# Patient Record
Sex: Female | Born: 1935 | Race: White | Hispanic: No | Smoking: Current every day smoker
Health system: Southern US, Community
[De-identification: ages and names within clinical notes are randomized; demographics above are authoritative.]

## PROBLEM LIST (undated history)

## (undated) DIAGNOSIS — Z8679 Personal history of other diseases of the circulatory system: Secondary | ICD-10-CM

## (undated) DIAGNOSIS — H269 Unspecified cataract: Secondary | ICD-10-CM

## (undated) DIAGNOSIS — I447 Left bundle-branch block, unspecified: Secondary | ICD-10-CM

## (undated) DIAGNOSIS — R739 Hyperglycemia, unspecified: Secondary | ICD-10-CM

## (undated) DIAGNOSIS — H9190 Unspecified hearing loss, unspecified ear: Secondary | ICD-10-CM

## (undated) DIAGNOSIS — H3530 Unspecified macular degeneration: Secondary | ICD-10-CM

## (undated) DIAGNOSIS — S065X9A Traumatic subdural hemorrhage with loss of consciousness of unspecified duration, initial encounter: Secondary | ICD-10-CM

## (undated) DIAGNOSIS — R5381 Other malaise: Secondary | ICD-10-CM

## (undated) DIAGNOSIS — J449 Chronic obstructive pulmonary disease, unspecified: Secondary | ICD-10-CM

## (undated) DIAGNOSIS — I10 Essential (primary) hypertension: Secondary | ICD-10-CM

## (undated) DIAGNOSIS — E785 Hyperlipidemia, unspecified: Secondary | ICD-10-CM

## (undated) DIAGNOSIS — Z720 Tobacco use: Secondary | ICD-10-CM

## (undated) DIAGNOSIS — F329 Major depressive disorder, single episode, unspecified: Secondary | ICD-10-CM

## (undated) DIAGNOSIS — I480 Paroxysmal atrial fibrillation: Secondary | ICD-10-CM

## (undated) DIAGNOSIS — R079 Chest pain, unspecified: Secondary | ICD-10-CM

## (undated) DIAGNOSIS — R413 Other amnesia: Secondary | ICD-10-CM

## (undated) HISTORY — DX: Tobacco use: Z72.0

## (undated) HISTORY — DX: Unspecified macular degeneration: H35.30

## (undated) HISTORY — DX: Chest pain, unspecified: R07.9

## (undated) HISTORY — PX: EYE SURGERY: SHX253

## (undated) HISTORY — PX: ABDOMINAL HYSTERECTOMY: SHX81

## (undated) HISTORY — DX: Other amnesia: R41.3

## (undated) HISTORY — DX: Essential (primary) hypertension: I10

## (undated) HISTORY — PX: APPENDECTOMY: SHX54

## (undated) HISTORY — DX: Unspecified cataract: H26.9

## (undated) HISTORY — PX: BRAIN SURGERY: SHX531

## (undated) HISTORY — DX: Other malaise: R53.81

## (undated) HISTORY — DX: Chronic obstructive pulmonary disease, unspecified: J44.9

## (undated) HISTORY — DX: Hyperlipidemia, unspecified: E78.5

## (undated) HISTORY — DX: Traumatic subdural hemorrhage with loss of consciousness of unspecified duration, initial encounter: S06.5X9A

## (undated) HISTORY — DX: Major depressive disorder, single episode, unspecified: F32.9

## (undated) HISTORY — DX: Unspecified hearing loss, unspecified ear: H91.90

## (undated) HISTORY — DX: Hyperglycemia, unspecified: R73.9

## (undated) HISTORY — DX: Paroxysmal atrial fibrillation: I48.0

## (undated) HISTORY — DX: Personal history of other diseases of the circulatory system: Z86.79

## (undated) HISTORY — DX: Left bundle-branch block, unspecified: I44.7

---

## 2001-07-22 ENCOUNTER — Inpatient Hospital Stay (HOSPITAL_COMMUNITY): Admission: EM | Admit: 2001-07-22 | Discharge: 2001-07-24 | Payer: Self-pay | Admitting: Emergency Medicine

## 2001-07-23 ENCOUNTER — Encounter: Payer: Self-pay | Admitting: Emergency Medicine

## 2003-05-22 ENCOUNTER — Encounter: Payer: Self-pay | Admitting: Emergency Medicine

## 2003-05-22 ENCOUNTER — Emergency Department (HOSPITAL_COMMUNITY): Admission: EM | Admit: 2003-05-22 | Discharge: 2003-05-22 | Payer: Self-pay | Admitting: Emergency Medicine

## 2004-01-05 ENCOUNTER — Emergency Department (HOSPITAL_COMMUNITY): Admission: EM | Admit: 2004-01-05 | Discharge: 2004-01-05 | Payer: Self-pay | Admitting: Emergency Medicine

## 2004-12-03 ENCOUNTER — Ambulatory Visit (HOSPITAL_COMMUNITY): Admission: RE | Admit: 2004-12-03 | Discharge: 2004-12-03 | Payer: Self-pay | Admitting: *Deleted

## 2005-02-14 ENCOUNTER — Ambulatory Visit: Payer: Self-pay | Admitting: Internal Medicine

## 2005-02-14 ENCOUNTER — Inpatient Hospital Stay (HOSPITAL_COMMUNITY): Admission: EM | Admit: 2005-02-14 | Discharge: 2005-02-15 | Payer: Self-pay | Admitting: Emergency Medicine

## 2005-07-21 ENCOUNTER — Emergency Department (HOSPITAL_COMMUNITY): Admission: EM | Admit: 2005-07-21 | Discharge: 2005-07-21 | Payer: Self-pay | Admitting: Emergency Medicine

## 2007-10-22 DIAGNOSIS — S065XAA Traumatic subdural hemorrhage with loss of consciousness status unknown, initial encounter: Secondary | ICD-10-CM

## 2007-10-22 DIAGNOSIS — S065X9A Traumatic subdural hemorrhage with loss of consciousness of unspecified duration, initial encounter: Secondary | ICD-10-CM

## 2007-10-22 HISTORY — DX: Traumatic subdural hemorrhage with loss of consciousness status unknown, initial encounter: S06.5XAA

## 2007-10-22 HISTORY — DX: Traumatic subdural hemorrhage with loss of consciousness of unspecified duration, initial encounter: S06.5X9A

## 2008-02-09 ENCOUNTER — Inpatient Hospital Stay (HOSPITAL_COMMUNITY): Admission: EM | Admit: 2008-02-09 | Discharge: 2008-02-16 | Payer: Self-pay | Admitting: Internal Medicine

## 2008-02-09 ENCOUNTER — Encounter: Payer: Self-pay | Admitting: Emergency Medicine

## 2008-02-20 ENCOUNTER — Inpatient Hospital Stay (HOSPITAL_COMMUNITY): Admission: EM | Admit: 2008-02-20 | Discharge: 2008-02-24 | Payer: Self-pay | Admitting: Emergency Medicine

## 2008-03-22 ENCOUNTER — Encounter: Admission: RE | Admit: 2008-03-22 | Discharge: 2008-03-22 | Payer: Self-pay | Admitting: Neurosurgery

## 2011-03-05 NOTE — Consult Note (Signed)
NAMESUNDAY, [name]                ACCOUNT NO.:  [id number]   MEDICAL RECORD NO.:  [id number]          PATIENT TYPE:  INP   LOCATION:  1426                         FACILITY:  [facility]   PHYSICIAN:  John C. Madilyn Vaughn, M.D.    DATE OF BIRTH:  1924/04/20   DATE OF CONSULTATION:  02/20/2008  DATE OF DISCHARGE:                                 CONSULTATION   REASON FOR CONSULTATION:  Presumed ischemic colitis.   HISTORY OF ILLNESS:  The patient is an 77 year old white female admitted  today after lower abdominal pain and brief vomiting beginning yesterday.  She has had some loose bowel movements but no gross blood.  She came to  the emergency room where she was found to have a markedly tender abdomen  particularly on the left and the CT scan showed findings compatible with  ischemic colitis with mild wall thickening of the descending colon near  the splenic flexure; also, some diverticulosis was noted with no  definite diverticulitis but that possibly being in the differential.  Incidental gallstones were also noted.  The patient has a wbc count of  23,300 yesterday and lactic acid level slightly elevated to 2.7.  Her  CO2 level was 24.  She has been afebrile and her abdominal pain has  improved slightly.  She had a colonoscopy in December 2006.   PAST MEDICAL HISTORY:  1. Recent subdural hematoma status post burr hole.  2. Paroxysmal atrial fibrillation and flutter previously on Coumadin      until the subdural.  3. Hypertension.  4. Diverticulosis.  5. COPD.  6. Cholecystectomy, presumed asymptomatic.   SURGERIES:  1. Cranial burr hole for subdural hematoma.  2. Partial hysterectomy and bladder suspension 1978.  3. Appendectomy.   MEDICATIONS:  Verapamil, captopril.   SOCIAL HISTORY:  The patient is retired from [employer].  She smokes 10 cigarettes a day and denies alcohol use.   PHYSICAL EXAM:  Elderly white female in mild distress complaining of  abdominal  pain, improved from yesterday.   LABORATORIES:  WBC 26,300; hemoglobin 12.5; platelets 450,000.  CT of  the abdomen as mentioned above.   IMPRESSION:  Likely ischemic colitis, cannot rule out infectious causes.   PLAN:  Bowel rest, general IV support and broad-spectrum antibiotics.  Will follow her wbc count and lactate level as well as her abdominal  exam for any signs of with nonviability of the colon.           ______________________________  [name], M.D.     JCH/MEDQ  D:  02/20/2008  T:  02/20/2008  Job:  [id number]   cc:   Georgiana Spinner, M.D.  Fax: [phone]

## 2011-03-05 NOTE — Discharge Summary (Signed)
[name], [name]                ACCOUNT NO.:  [id number]   MEDICAL RECORD NO.:  [id number]          PATIENT TYPE:  INP   LOCATION:  1426                         FACILITY:  [facility]   PHYSICIAN:  Madaline Savage, MD        DATE OF BIRTH:  Mar 26, 1924   DATE OF ADMISSION:  02/19/2008  DATE OF DISCHARGE:  02/24/2008                               DISCHARGE SUMMARY   PRIMARY CARE PHYSICIAN:  Dr. Nicholos Johns.   CONSULTS IN THE HOSPITAL:  Was seen by Dr. Madilyn Fireman from gastroenterology.   DISCHARGE DIAGNOSES:  1. Ischemic colitis versus infective colitis.  2. Leukocytosis.  3. Paroxysmal atrial fibrillation.  4. Hypertension.  5. Chronic obstructive pulmonary disease.  6. Recent subdural hematoma status post bur hole surgery.   DISCHARGE MEDICATIONS:  1. Verapamil SA 240 mg daily.  2. Captopril 50 mg twice daily.  3. Tylenol 50 mg as needed.  4. Augmentin 500 mg twice daily for seven more days.   PROCEDURES DONE IN THE HOSPITAL:  1. She had an abdominal CT done on Feb 19, 2008 which showed      cardiomegaly and left lower lobe atelectasis/infiltrate,      nonobstructive bowel gas pattern, gaseous distention of colonic      loops.  2. She had a CT of the abdomen and pelvis done Feb 20, 2008 which      showed slight wall thickening and associated inflammatory change      involving the descending colon.  Primary concern is ischemia given      its distribution.  Also consider infectious colitis and possibly      diverticulitis.  3. She had an abdominal x-ray on Feb 21, 2008 which showed slightly      more gaseous distention of the colon.  No definite free air.   HISTORY OF PRESENT ILLNESS:  For full history and physical, see the  history and physical dictated by Dr. Eda Paschal on Feb 19, 2008.  In short,  [name] is an 77 year old lady who had a history of paroxysmal  atrial fibrillation who was taken off Coumadin after she had a subdural  hematoma.  She now comes in with abdominal pain and  constipation for 2  weeks.  Her initial CT scan showed suspected ischemic colitis and she  was admitted for further evaluation and management.   PROBLEM LIST:  1. Ischemic colitis versus infectious colitis.  [name] was      admitted and GI was consulted.  At this time, the primary concern      is this is ischemic colitis likely secondary to her atrial      fibrillation.  She was empirically started on Zosyn and she was      given bowel rest.  She has symptomatically improved at this time.      GI has recommended that she can be discharged home on a low-residue      diet.  At this time, she is not a candidate for anticoagulation      because of her history of subdural hematoma.  We will  complete her      course of antibiotics with one more week of Augmentin.  2. Paroxysmal atrial fibrillation.  She has been rate controlled while      she was in the hospital.  She is not a candidate for      anticoagulation.  3. Leukocytosis which has resolved with her antibiotic treatment.  4. Hypertension.  Her blood pressure has been stable.   DISPOSITION:  She is now being discharged home in stable condition.   FOLLOWUP:  She is asked to follow up with her primary care doctor, Dr.  Nicholos Johns in 1 week.      Madaline Savage, MD  Electronically Signed     PKN/MEDQ  D:  02/24/2008  T:  02/24/2008  Job:  [id number]

## 2011-03-05 NOTE — Discharge Summary (Signed)
[name], [name]                ACCOUNT NO.:  [id number]   MEDICAL RECORD NO.:  [id number]          PATIENT TYPE:  INP   LOCATION:  3112                         FACILITY:  [facility]   PHYSICIAN:  Lonia Blood, M.D.      DATE OF BIRTH:  23-Apr-1924   DATE OF ADMISSION:  02/09/2008  DATE OF DISCHARGE:                               DISCHARGE SUMMARY   PRIMARY CARE PHYSICIAN:  Georgianne Fick, M.D.   DISCHARGE DIAGNOSES:  1. Coumadin-induced coagulopathy with subsequent bleed.  2. Subdural hematoma status post bur hole.  3. Hypertension.  4. Paroxysmal atrial fibrillation.   DISCHARGE MEDICATIONS:  To be dictated at the time of discharge.   DISPOSITION:  The patient is currently stable after craniotomy and bur  hole.  She has occasional headaches that has improved so much from the  beginning.  She will be discharged once cleared by Dr. Wynetta Emery.   PROCEDURES PERFORMED THIS ADMISSION:  1. Chest x-ray on February 09, 2008, that shows mild cardiomegaly,      chronic lung changes, but no acute process.  Head CT without      contrast on February 09, 2008, that showed acute-on chronic bilateral      right greater than left subdural hematomas, small amount of      subarachnoid hemorrhage in the basal systems.  2. A follow-up head CT on February 10, 2008, showed bilateral subdural      hematomas approximately 7 degrees, left midline shift.  Chest x-ray      on February 12, 2008, shows cardiomegaly, COPD, and emphysema.  Head      CT on February 13, 2008, showed interval placement of right subdural      drain with evacuation of the previously seen right subdural      hematoma, interval areas of acute hemorrhage within the left      subdural hematoma with a mild increase in size of the hematoma that      associated 2 mm of midline shift from left to right.  Head CT on      February 14, 2008, showed interval small right frontal subdural      hygroma following drain removal, but there is decreased size of the  left subdural hematoma with decreased mass effect.  3. Right bur hole for evacuation of acute-on chronic subdural hematoma      performed by Dr. Donalee Citrin.   CONSULTATION:  1. Donalee Citrin, M.D., Neurosurgery.  2. Neurology.   BRIEF HISTORY AND PHYSICAL:  Please refer to dictated history and  physical on admission by Dr. Marthann Schiller.  In short, however, this  is an 77 year old female presenting with a fall about a week earlier to  presentation.  The patient has been on Coumadin for paroxysmal atrial  fibrillation.  She has recently been treated with antibiotics as well.  In the ED, she was found to have some slight altered mental status.  Otherwise, her blood pressure was 76/88.  Her labs, at that point, also  indicated some elements of coagulopathy with PT of 13.51 and INR of 2.9.  Subsequently, the patient was scanned and the hematomas were found as  indicated above.  She was admitted for further management to the ICU and  Neurosurgery was immediately consulted.   HOSPITAL COURSE:  1. Subdural hematoma.  The patient was admitted in to the Neuro ICU.      Followed by both Neurology and Neurosurgery.  She has remained      remarkably stable neurologically except for the headaches.  The bur      hole was performed surgically, and since then, she has continued to      improve steadily to this point.  She will have a repeat CT scan      today and possibly discharged home in the next 1-2 days.  2. Hypertension.  Her blood pressure was controlled effectively on      current regimen of medication in the hospital, and the patient has      continued to improve.  3. Coagulopathy.  Due to the patient's bleeding, she has been taken      off the Coumadin at this point.  She probably should not be on any      anticoagulants in the near future.  The patient understand the      risks versus benefits of using the anticoagulants, especially with      her paroxysmal atrial fibrillation, but  realizes that she cannot      continue to use Coumadin anyone.  Other medical problems so far has      been stable on this admission and further addendum could be      dictated at the time of discharge as needed.      Lonia Blood, M.D.  Electronically Signed     LG/MEDQ  D:  02/15/2008  T:  02/15/2008  Job:  [id number]

## 2011-03-05 NOTE — H&P (Signed)
[name], [name]                ACCOUNT NO.:  [id number]   MEDICAL RECORD NO.:  [id number]          PATIENT TYPE:  EMS   LOCATION:  ED                           FACILITY:  [facility]   PHYSICIAN:  Altha Harm, MDDATE OF BIRTH:  1924-07-02   DATE OF ADMISSION:  02/09/2008  DATE OF DISCHARGE:                              HISTORY & PHYSICAL   CHIEF COMPLAINT:  Headache.   HISTORY OF PRESENT ILLNESS:  This is a very active 77 year old lady who  sustained a fall down the stairs approximately 1 week ago.  The patient  states that approximately 5 days ago she started having a headache.  She  was seen by physicians in the community on 2 occasions and was initially  treated for sinusitis and then for arthritis.  The patient and her  family state that she had no changes in her mental status during that  time and did not offer any information about having had a fall to either  of her physicians.  Today, however, the patient called her daughter-in-  law.  The daughter-in-law states that she was confused as compared to  her usual state of consciousness.  She also states that on the phone her  speech appeared a bit slurred and she was somewhat lethargic.  This  prompted her to bring her to the emergency room.  In the emergency room  she had a CT scan done which showed that she had acute on chronic  subdural hematomas, right greater than left, with a component of  subarachnoid hemorrhage.  The patient was evaluated by Donalee Citrin of  neurosurgery and was discussed with me.  We have agreed to admit the  patient to the medical service with neurosurgery on consultation.  Concerning the issue, the patient is on Coumadin for atrial fibrillation  at this time.  The patient has had no fevers or chills.  She has had no  nausea, vomiting or diarrhea.  She has had no loss of speech, no loss of  consciousness, no seizure activity.  She has had no chest pain, no  cough.   PAST MEDICAL HISTORY:   Significant for the following:  1. Macular degeneration, status post lens implants in both eyes.  2. History of diverticulosis.  3. History of cholelithiasis.  4. Paroxysmal atrial fibrillation.  5. Hypertension.  6. COPD.  7. Hyperlipidemia.   FAMILY HISTORY:  Is not relevant in a patient of this age.   SOCIAL HISTORY:  This is a lady who lives alone and works as a Engineer, agricultural at [employer].  She is an  extremely active 77 year old and has smoked for 62 years, 1 pack per  day.  She quit 1 week ago.  She has no alcohol or drug use.  She has a  very supportive family including her son and her daughter-in-law, Jearld Lesch, who can be reached at home at [phone] and on the cell  phone at [phone].  Her daughter-in-law is a retired Arboriculturist.   CURRENT MEDICATIONS:  Coumadin 5 mg p.o.  daily, verapamil SA 240 mg p.o.  daily, captopril 50 mg p.o. b.i.d., Benadryl 50 mg p.o. b.i.d.  The  patient was recently prescribed Xyzal and Ultram for presumed treatment  of sinusitis and arthritis.   ALLERGIES:  NO KNOWN DRUG ALLERGIES.   PRIMARY CARE PHYSICIAN:  Dr. Nicholos Johns.   CARDIOLOGIST:  Her cardiologist is Dr. Lucas Mallow.   REVIEW OF SYSTEMS:  Fourteen systems were reviewed.  All systems were  negative except as noted in the HPI.   STUDIES DONE IN THE EMERGENCY ROOM:  A CT of the head shows acute on  chronic with right greater than left subdural hematomas and a small  component of subarachnoid hemorrhage.  Sodium was 139, potassium 3.8,  chloride 103, bicarb 27, BUN 12, creatinine 0.59, glucose of 97.  White  blood cell count 8.8, hemoglobin 13, hematocrit 39.9, platelet count  381.  An INR of 2.9, PT of 31.5 and PTT of 5.2.  A urinalysis was done.  No elements consistent with a urinary tract infection are present.   PHYSICAL EXAMINATION:  The patient is resting comfortably in bed.  She  continues to complain of a headache.   VITAL SIGNS:  As follows:  Temperature 97, blood pressure 176/88, heart  rate 76, respiratory rate 18, pulse ox 95% on room air and 100% on 2  liters.  HEENT EXAMINATION:  She is normocephalic, atraumatic.  Pupils are equal,  round and reactive to light and accommodation.  Extraocular movements  are intact.  Tympanic membranes are translucent bilaterally with good  landmarks.  The patient is unable to participate in a fundus examination  at this time.  Oropharynx is moist.  No exudate, erythema or lesions are  noted.  NECK EXAMINATION:  Trachea is midline.  No masses, no thyromegaly, no  JVD, no carotid bruit.  RESPIRATORY EXAMINATION:  The patient has a  normal respiratory effort, even excursion bilaterally.  No wheezing or  rhonchi noted.  CARDIOVASCULAR:  She has a normal S1 and S2, irregularly regular.  No  murmurs, rubs or gallops noted.  PMI is nondisplaced.  No heaves or  thrills on palpation.  ABDOMINAL EXAMINATION:  The abdomen is obese, soft, nontender,  nondistended.  No masses, no hepatosplenomegaly noted.  NEUROLOGICAL EXAMINATION:  The patient is alert and oriented x3.  Strength in bilateral upper and lower extremities is 5/5.  DTRs are 2+  bilaterally in the upper and lower extremities.  The patient has no  focal neurological deficits and cranial nerves II-XII appear to be  grossly intact.  She has no ulnar drift and no diadochokinesis noted.  PSYCHIATRIC EXAMINATION:  She is alert and oriented x3.  Good insight,  cognition.  Good recent and remote recall.  LYMPH NODE SURVEY:  She has no cervical, axillary, inguinal  lymphadenopathy noted.  MUSCULOSKELETAL:  She has arthritic changes of  the small joints of the upper and lower extremities.  She has no warmth,  swelling or erythema around the joints.  There is no spinal tenderness  noted.   ASSESSMENT/PLAN:  A patient who presents with:  1. Warfarin coagulopathy.  2. Bilateral subdural hematoma.  3. Acute subarachnoid  hemorrhage, albeit small.  4. Paroxysmal atrial fibrillation.  5. Hypertension.  6. History of chronic obstructive pulmonary disease.  7. Hyperlipidemia.   PLAN:  The patient will be admitted to the step-down unit at [facility].  We will reverse her coagulopathy with vitamin K and FFP,  repeat a CT in the  morning to assess for any extension of the bleed.  The patient will have control of her heart rate by re-placing her on her  verapamil and we will carefully monitor her blood pressure so that her  blood pressure stays between 140 and 160 systolic.  Will give her  medications for pain control and do neurological checks on her.  Dr.  Wynetta Emery, neurosurgery, has been consulted and is aware of the patient's  condition and involved in her care.  Will do best practice for GI  prophylaxis, however, for DVT prophylaxis the patient will be on SCDs as  anticoagulation is contraindicated this time.  We will monitor her blood  sugars as per ICU protocol.      Altha Harm, MD  Electronically Signed     MAM/MEDQ  D:  02/09/2008  T:  02/09/2008  Job:  [id number]   cc:   Georgianne Fick, M.D.  Fax: [phone]   Jaclyn Prime. Lucas Mallow, M.D.  Fax: [phone]   Donalee Citrin, M.D.  Fax: [phone]

## 2011-03-05 NOTE — Op Note (Signed)
[name], [name]                ACCOUNT NO.:  [id number]   MEDICAL RECORD NO.:  [id number]          PATIENT TYPE:  INP   LOCATION:  3112                         FACILITY:  [facility]   PHYSICIAN:  Donalee Citrin, M.D.        DATE OF BIRTH:  1924/03/11   DATE OF PROCEDURE:  02/12/2008  DATE OF DISCHARGE:                               OPERATIVE REPORT   PREOPERATIVE DIAGNOSIS:  Right acute on subacute subdural hematoma.   POSTOPERATIVE DIAGNOSIS:  Right acute on subacute subdural hematoma.   PROCEDURE:  Right bur hole for evacuation of acute on chronic subdural  hematoma.   ANESTHESIA:  General endotracheal.   HISTORY OF PRESENT ILLNESS:  The patient is an 77 year old female who  presented to the emergency room few days ago, complaining of headaches,  nausea, and vomiting.  CT scan showed bilateral subdurals right side  worse, 3-mm right-to-left shift.  Subdural was predominantly chronic,  however, there was a small amount of acute blood, however, was felt to  be able to get enough to bur holes.  So, the patient has recommended bur  hole evacuation after reversal of her Coumadin.  Risks and benefits of  operation were explained to the patient and her family.   PROCEDURE IN DETAIL:  The patient was brought to the OR, was induced  general anesthesia, positioned supine.  The head turned to the left  side.  The right side of her skull exposed over the shoulder bump under  her right shoulder.  Her hair was shaved back in hair spraying manner  and two small incisions were drawn out one frontal, one parietal.  After  infiltrating 10 mL of lidocaine with epi, two bur holes were drilled.  The cup curette was used to undermine the skull and the dura was  coagulated and incised in cruciate fashion.  Chronic __________  appearing subdural came out under pressure from the frontal bur hole,  more acute blood came out of the parietal bur hole.  Several membranes  were incised to actually see cortical  surface, however, cortical surface  was visualized at both sides and both levels and both holes.  Then, a  red rubber catheter was passed irrigating the entire time until clear  irrigant was coming out of the contour bur hole.  Then, a JP drain was  placed from the frontal to the parietal bur hole overlying the three  Penfield and the distal end of the brain was visualized through the  parietal bur hole to confirm that it had not migrated  intraparenchymally.  Then, this was tunneled laterally and hooked up to  a bulb suction.  Then, the parietal bur hole was closed.  The frontal  bur hole was then irrigated again and closed with interrupted Vicryl  staples in the skin.  Drain was sutured and the wounds were dressed.  The patient went to recovery room in stable condition.           ______________________________  Donalee Citrin, M.D.     GC/MEDQ  D:  02/12/2008  T:  02/13/2008  Job:  [id number] 

## 2011-03-05 NOTE — Discharge Summary (Signed)
[name], KEPPLE                ACCOUNT NO.:  [id number]   MEDICAL RECORD NO.:  [id number]          PATIENT TYPE:  INP   LOCATION:  3032                         FACILITY:  [facility]   PHYSICIAN:  Isidor Holts, M.D.  DATE OF BIRTH:  01/30/1924   DATE OF ADMISSION:  02/09/2008  DATE OF DISCHARGE:                               DISCHARGE SUMMARY   ADDENDUM   PRIMARY MEDICAL DOCTOR:  Dr.  Georgianne Fick, M.D.   For discharge diagnoses, consultations, procedures, admission history  and detailed clinical course, refer to interim discharge summary  dictated February 15, 2008  by Yehuda Budd.   In addition, on February 16, 2008, the following are pertinent:  The  patient remained clinically stable, without any further symptomatology.  Blood pressure was adequately controlled.  There were no new issues.  The patient was seen by Dr. Wynetta Emery, neuro surgeon on February 16, 2008 and  cleared from neurosurgical standpoint for discharge.   DISCHARGE MEDICATIONS:  1. Captopril 50 mg p.o. b.i.d.  2. Verapamil SR 240 mg p.o. daily.  3. Tylenol Extra Strength 1 gram p.o. p.r.n. q.6 hours.  4. Xyzal 5 mg p.o. daily p.r.n.   DISPOSITION:  The patient was on February 16, 2008 considered clinically  stable for discharge, with home health PT, OT and aide.  It is likely  that she will be discharged on February 16, 2008, provided home  circumstances can be sorted out, as the patient does live alone. A  clinical Child psychotherapist and case manager have been consulted.   DIET:  Heart-healthy.   ACTIVITY:  As tolerated, otherwise per physical therapy/occupational  therapy.   FOLLOW-UP INSTRUCTIONS:  The patient is to follow up with her primary  M.D., Dr. Georgianne Fick within 1-2 weeks of discharge.  She is to  follow up with Dr. Wynetta Emery, neurosurgeon, at a date to be determined.      Isidor Holts, M.D.  Electronically Signed     CO/MEDQ  D:  02/16/2008  T:  02/16/2008  Job:  [id number]   cc:   Georgianne Fick, M.D.  Donalee Citrin, M.D.

## 2011-03-05 NOTE — Consult Note (Signed)
[name], [name]                ACCOUNT NO.:  [id number]   MEDICAL RECORD NO.:  [id number]          PATIENT TYPE:  EMS   LOCATION:  ED                           FACILITY:  [facility]   PHYSICIAN:  Donalee Citrin, M.D.        DATE OF BIRTH:  [birth year]   DATE OF CONSULTATION:  DATE OF DISCHARGE:                                 CONSULTATION   REASON FOR CONSULTATION:  Bilateral subacute subdural hematomas.   HISTORY OF PRESENT ILLNESS:  The patient is an 77 year old female who  presents to the emergency room complaining of confusion with couple  episodes of nausea and vomiting, brought by her daughter, who was a  former Engineer, civil (consulting).  The patient reports history of falling some time over 3  weeks ago down the steps.  She says she did not hit her head, but  certainly could shook things up a little bit.  She has had some  headaches and confusion, memory difficulty, concentration difficulty.  She denies any numbness, tingling once about the other.  She had 1-2  episodes of nausea and vomiting today, but no other focal neurologic  findings.   PAST MEDICAL HISTORY:  1. Her past medical history is remarkable for atrial fibrillation to      which she is on Coumadin for and the INR in the ER was 2.9.  2. COPD.  3. Hypertension.  4. Hypercholesterolemia.   PAST SURGICAL HISTORY:  Unavailable at this time.   PHYSICAL EXAMINATION:  GENERAL:  The patient is awake, alert, and  oriented x4.  HEENT:  Cranial nerves intact.  Pupils are equal, round, and reactive to  light.  Extraocular movements are intact.  EXTREMITIES:  Strength is 5/5 in upper and lower extremities with no  sign of pronator drift.  Sensation appears to be grossly intact.   CT scan shows bilateral subacute subdural hematomas with small amount of  acute component on the right that does not appear to be any midline  shift, although the CT windowing is rather poor, so the true size of the  CT subdural is difficult to appreciate.  However,  with a current mental  status being well, awake, and alert with an INR of 2.9.  The patient is  being seen by Internal Medicine.  She is the patient of Dr.  Nicholos Johns, and they are going to admit her for observation and  medical management.  I will be back to recommend a reverse of  coagulopathy with vitamin K and FFE and serial CTs.  At which point, the  patient might need a right-sided burr hole treatment versus continued  observation.  We will transfer the patient over the time.  We will admit  for emergency medicine and we will to continue to follow her along  there.           ______________________________  Donalee Citrin, M.D.     GC/MEDQ  D:  02/09/2008  T:  02/10/2008  Job:  [id number]

## 2011-03-05 NOTE — H&P (Signed)
NAME:  [name], [name]                ACCOUNT NO.:  [id number]   MEDICAL RECORD NO.:  [id number]          PATIENT TYPE:  INP   LOCATION:  0102                         FACILITY:  [facility]   PHYSICIAN:  Hind I Elsaid, MD      DATE OF BIRTH:  Mar 19, 1924   DATE OF ADMISSION:  02/19/2008  DATE OF DISCHARGE:                              HISTORY & PHYSICAL   PRIMARY CARE PHYSICIAN:  Georgianne Fick, M.D.   CHIEF COMPLAINT:  Abdominal pain and constipation for two weeks, and  vomiting for one day.   HISTORY OF THE PRESENT ILLNESS:  This is an 77 year old female who was  recently discharged from the hospital after being diagnosed with a  subdural hematoma status post bur hole, also Coumadin-induced  coagulopathy with subsequent subdural hematoma and status post  discontinued Coumadin indefinitely.  She is admitted today to the  hospital with the chief complaint of left lower abdominal pain for two  weeks.  The pain is 10/10 and nonradiating.  The pain is also associated  with constipation for two weeks.  The daughter-in-law tried to give the  patient a Fleets enema and some laxatives at home, but without any bowel  movement today.  The patient tried to go to the bathroom where the  daughter-in-law noticed that the patient had become clammy, cold and  pale.  She had to put her in the bed and then brought her to the  emergency room here.  As per the daughter-in-law the patient has had 20  episodes of vomiting of mainly greenish/yellow vomitus today.  Her  condition is associated with no fever.  According to the patient she has  a good appetite.  The patient denies any tenesmus.   PAST MEDICAL AND SURGICAL HISTORY:  1. History of a subdural hematoma status post bur hole.  2. Hypertension.  3. Paroxysmal atrial fibrillation/flutter.  4. Coumadin-induced coagulopathy.  5. Diverticulosis.  6. COPD/bronchial asthma.  7. Status post appendectomy.  8. Status post partial hysterectomy and bladder  tack in 1978.  9. Macular degeneration.  10.Status post lens implant.  11.Cholelithiasis.  12.Seasonal allergies.   MEDICATIONS:  1. Verapamil 240 mg daily.  2. Captopril 50 mg twice daily.  3. Tylenol Extra Strength 1 gram by mouth as needed every 6 hours.  4. Oxyzal 5 mg by mouth daily as needed.   SOCIAL HISTORY:  The patient used to work as a Marine scientist at [employer].  She stopped working after  her subdural hematoma.  She used to be an extremely active lady.  She is  currently a smoker, although she tried to quit and now smokes 10  cigarettes per day.  She has a very supportive family, especially her  daughter-in-law, who is a Geologist, engineering.   ALLERGIES:  No known drug allergies.   REVIEW OF SYSTEMS:  Complains of headaches sometimes, but these improved  after the evacuation of the subdural hematoma.  Denies any seizure  activity.  Denies any chest pain.  Denies any cough.  Denies any  shortness of breath.  Denies any burning micturition.  Denies any  abnormal rash.   PHYSICAL EXAMINATION:  GENERAL APPEARANCE:  On examination the patient  is lying comfortably in bed.  She is not in respiratory distress nor has  shortness of breath.  VITAL SIGNS:  Temperature 98, blood pressure in the beginning was 90/43  and improved to 174/67, pulse rate 78, respiratory rate 24, and sating  93% on room air.  HEENT:  Head is normocephalic and atraumatic, status post a bur hole in  her right temporal area without evidence of infection.  Pupils are  round, equal and react to light and accommodation.  Extraocular muscle  movements are intact.  Oropharynx is dry with no exudate.  NECK:  The trachea is midline.  No masses.  No thyromegaly.  No JVD.  No  bruits.  CHEST AND LUNGS:  On respiratory examination there is normal respiratory  effort.  Normal fascicular breathing without wheezing or rhonchi.  HEART:  Cardiovascular with normal S1 and S2,  irregularly irregular.  ABDOMEN:  The abdomen is distended.  Tender to palpation in the left  lower quadrant.  No organomegaly.  Bowel sounds are diminished.  NEUROLOGIC:  On neurological examination the patient is alert and  oriented x3 with no focal neurological findings.   LABORATORY FINDINGS:  Lipase 31.  Sodium 142, potassium 3.2, chloride  103, CO2 24, glucose 213, BUN 23, and creatinine 1.33.  Total bilirubin  0.8, alkaline phosphatase 109, AST 30 and ALT 16.  Total protein 6.9,  albumin 3.3 and calcium 10.1.  CBC 23.3, hemoglobin 12.5, hematocrit  37.9, and platelets 450,000.  Urinalysis; white blood cells 3-6 with  hyaline casts.  CT of the abdomen and pelvis showed evidence of  inflammatory changes of the proximal sigmoid colon, which could indicate  ischemia or diverticulitis.  Abdominal x-ray and chest x-ray showed  cardiomegaly and left lower lobe atelectasis or infiltrate.  Nonobstructive bowel gas pattern.  Gaseous distention of the colonic  loop.   ASSESSMENT AND PLAN:  1. Abdominal pain and distention.  This is possibility secondary to      colitis, which could be ischemic versus infectious; I am leaning      towards ischemic.  The patient will be admitted to the hospital,      kept nothing per os and started on intravenous fluids at 100      cc/hour with potassium supplement.  We will place the patient      empirically on Zosyn.  We will get stools for Clostridium difficile      to rule out infectious colitis if the patient has a bowel movement.      We will consider consulting gastroenterology.  2. Constipation for two weeks.  We will hold verapamil .  3. Acute renal failure.  Intravenous fluids.  4. New onset diabetes mellitus.  We will get a hemoglobin A-1-C and      place the patient on a sliding scale coverage at this time.  5. Hyperkalemia.  We will place her on replacement.  6. Hypertension.  We will discontinue verapamil and angiotensin      converting  enzyme inhibitors.  We will place the patient on a      clonidine patch along with Lopressor intravenously at a as needed      dosage.  7. Status post subdural hematoma; seems stable.  We will continue her      on physical therapy in the hospital.  8. History of paroxysmal  atrial fibrillation.  We will get an      electrocardiogram.  Her rate seems under control at this time.  9. Leukocytosis most probably secondary to ischemic versus infectious      colitis/diverticulitis.  10.Colonic distention.  We will repeat KUB in 1-2 days.  11.Deep venous thrombosis and gastrointestinal prophylaxes.  12.Further recommendations to be addressed as her hospital course      progresses.      Hind Bosie Helper, MD  Electronically Signed     HIE/MEDQ  D:  02/20/2008  T:  02/20/2008  Job:  [id number]

## 2011-03-08 NOTE — Discharge Summary (Signed)
[facility]  Patient:    [name], [name] Visit Number: [id number] MRN: [id number]          Service Type: MED Location: 3W 4782 02 Attending Physician:  Clovis Cao Dictated by:   Jaclyn Prime. Lucas Mallow, M.D. Admit Date:  07/22/2001 Discharge Date: 07/24/2001   CC:         Trudee Kuster, M.D.                           Discharge Summary  CHIEF COMPLAINT:  Weak spell, palpitations, and near fainting.  HISTORY OF PRESENT ILLNESS:  This 77 year old woman, whose primary physician is Dr. Trudee Kuster, was sent to the emergency room because of a weak spell during which she nearly passed out and felt palpitations in her chest.  She has a long history of palpitations and had relatively recently been found to have paroxysmal atrial fibrillation.  Her palpitations were described as fluttering in quality, severe in severity, and variable in duration.  She said that the feeling with atrial fibrillation is different from the palpitations she has had in the past.  Comprehensive details of past medical, family, and social history, and review of systems are to be found in the history and physical.  PHYSICAL EXAMINATION:  GENERAL:  Well-developed, well-nourished woman who looked younger than her stated age of 26.  VITAL SIGNS:  Blood pressure 177/93, pulse 100 and irregularly irregular, respirations 24.  Her respiratory effort was normal at rest but with moderately prolonged forced expiratory time and diffuse rhonchi with a few wheezes.  HEART:  Heart rhythm was irregular, but the rate was normal except for occasional episodes of faster heart rate.  The overall cardiac exam was unremarkable otherwise.  The remainder of the physical exam was also unremarkable.  LABORATORY DATA:  White blood cell count 8800, hemoglobin 14.6, hematocrit 43, mean cell volume 86, platelets 336,000, normal automated differential. Prothrombin time/INR 2.4.  Electrolytes normal,  creatinine 0.8, BUN 12.  CPK total peak 326 with 2.1 units of MB band, peak troponin I 0.41.  Initial EKG showed sinus tachycardia with left atrial enlargement and tiny Q-waves in the inferior leads, probably not diagnostic.  Repeat EKG July 23, 2001, showed atrial flutter with variable AV block.  Repeat EKG on July 24, 2001, incidentally incorrectly read by the official reader, showed sinus bradycardia with marked sinus arrhythmia and/or frequent PACs, not atrial fibrillation as interpreted.  Chest x-ray dated July 23, 2001, showed "heart size to be prominent. Pulmonary vascularity is accentuated."  HOSPITAL COURSE:  She was admitted to a telemetry bed, continued on her usual dose of Coumadin, and started on intensive therapy with oral diltiazem.  She was also continued on prednisone, which she had been taking for a respiratory tract infection/bronchitis.  On this regimen she had much better control of her atrial flutter and greatly increased comfort and decreased sense of "flip-flops."  Her enzymes were reviewed, and it was felt appropriate to carry out noninvasive testing, but this would require delay until her bronchospasm resolved.  That will be planned as an outpatient.  By the second full day in the hospital she was much more comfortable and anxious to return home.  Her chest still revealed clearly diffuse rhonchi.  At heart rhythm was consistent with episodic atrial flutter but overall well controlled rate.  She was considered adequately stable, with plans for follow-up as noted.  FINAL DIAGNOSES: 1. Presyncope due to paroxysmal  atrial fibrillation/flutter. 2. Paroxysmal atrial fibrillation/flutter. 3. Acute bronchitis with continued treatment with prednisone. 4. Tobacco abuse. 5. Chronic Coumadin therapy.  DIET:  No specific restrictions.  MEDICATIONS: 1. Prednisone 20 mg daily for five days. 2. Albuterol inhaler 2 puffs t.i.d. 3. Robitussin as needed. 4. Celexa  5 mg daily. 5. Coumadin 2.5 mg Tuesday and Thursday, 5 mg the other five days of the week. 6. Cardizem CD 360 mg daily. 7. Benadryl as needed.  RETURN TO WORK:  Not applicable.  SPECIAL INSTRUCTIONS:  The patient is strongly urged to discontinue the use of tobacco products.  FOLLOW-UP:  Return with physician, Dr. Lucas Mallow, in one to two weeks.  The patient is to call for an appointment.  We will plan to schedule her Persantine Cardiolite test as an outpatient once her bronchospasm is sufficiently suppressed. Dictated by:   Jaclyn Prime. Lucas Mallow, M.D. Attending Physician:  Clovis Cao DD:  08/11/01 TD:  08/12/01 Job: [id number]

## 2011-03-08 NOTE — H&P (Signed)
[facility]  Patient:    [name], [name] Visit Number: [id number] MRN: [id number]          Service Type: MED Location: 3W 4782 02 Attending Physician:  Clovis Cao Dictated by:   Jaclyn Prime. Lucas Mallow, M.D. Admit Date:  07/22/2001   CC:         Trudee Kuster, M.D.   History and Physical  CHIEF COMPLAINT:  Weak spell, palpitations and near fainting.  HISTORY OF PRESENT ILLNESS:  This 77 year old woman, primary care patient of Dr. Trudee Kuster, was sent to the emergency room because of a weak spell during which she nearly passed out and felt palpitations in her chest.  She has a long history of palpitations, and has relatively recently been found to have paroxysmal atrial fibrillation.  She says that the palpitations are fluttering in quality, severe in severity, and last variable lengths of time. She says that she has had a history of palpitations for many years, but that the feeling with atrial fibrillation is different.  PAST MEDICAL HISTORY:  Her past medical history includes a bladder tack and partial hysterectomy in 1978; this was complicated by a hematoma, and she was in the hospital for a total of five weeks.  She has also had macular degeneration for which she takes drops very frequently.  CURRENT MEDICATIONS:  Coumadin 2.5 mg Tuesday and Thursday, 5 mg Monday, Wednesday, Friday, Saturday and Sunday, Celexa, Benadryl, and Robitussin.  She takes prednisone and ketorolac drops for her eyes.  FAMILY HISTORY:  Her mother died at 27 of congestive heart failure and her father died at 65 of a heart attack.  Both of her parents had diabetes.  There is no known early coronary heart disease in the family.  SOCIAL HISTORY:  She lives alone; she has been divorced for 27 years.  She asks whether, in view of recent revelations in the media, she is likely to live a shorter length of time because of her divorce.  She continues to smoke.  REVIEW OF  SYSTEMS:  CONSTITUTIONAL:  She denies fevers, chills or sweats.  She has no claudication.  She sleeps fairly well.  EYES:  No diplopia or blurring. She does have macular edema in her right eye.  ENT:  No deafness or dizziness. She has partial dentures in both jaws.  CARDIOVASCULAR:  She denies any chest pain.  Her long history of palpitations is noted above.  She has no PND or orthopnea.  RESPIRATORY:  She has had recent cough and intermittent wheezing with some sputum production.  As noted above, she continues to smoke.  GI: She is bothered from a hiatal hernia and reflux.  No change in bowel habits. GU:  No dysuria or pyuria but has incontinence of urine.  MUSCULOSKELETAL:  No myalgia.  She is bothered by painful joints.  SKIN AND BREASTS:  No rash or nodule.  NEUROLOGIC:  Her near-syncope is noted above.  She has had no previous episodes of arrhythmia severe enough to feel like as though she were going to pass out.  PSYCHIATRIC:  No depression or hallucination.  ENDOCRINE: No known diabetes or thyroid disease.  HEMOLYMPH:  No swelling in the neck, axillae or groin.  ALLERGIC/LYMPHATIC:  She has no known drug allergy.  PHYSICAL EXAMINATION:  VITAL SIGNS:  Blood pressure 177/93, pulse 100 and irregularly irregular on admission, respirations 24.  GENERAL:  She is a well-developed, well-nourished woman who looks younger than her stated age; she is 75.  She is oriented to person, place and time and her mood and affect are appropriate.  HEENT:  Her conjunctivae and lids reveal no xanthelasma, icterus, or arcus senilis.  She has partial dentures and the oral mucosa otherwise reveals no pallor or cyanosis.  NECK:  Supple and symmetrical.  The trachea is midline and mobile.  There is no palpable thyromegaly, cervical node, carotid bruit or JVD.  LUNGS:  Her respiratory effort is normal at rest.  With forced expiration, there is moderately prolonged forced expiratory time, and diffuse  rhonchi with a few wheezes.  MUSCULOSKELETAL:  Her back is straight and nontender.  There is no kyphosis or scoliosis.  Her gait is not tested.  She probably could undergo a partial stress test.  Muscle strength and tone are age appropriate.  CARDIAC:  The cardiac apical impulse is cryptic.  The left border of cardiac dullness is within the left anterior axillary line and there is no palpable gallop sound.  The heart rhythm is irregular and the rate is normal, sometimes speeding up.  There is no gallop, click, or murmur.  Her digits and nails reveal no cyanosis.  SKIN:  Skin and subcutaneous tissue reveal no stasis dermatitis or ulcer.  ABDOMEN:  Flat and nontender.  There is no mass.  Bowel sounds are present. There is no palpable enlargement of the liver or spleen.  The abdominal aorta is not palpable and there is no bruit.  EXTREMITIES:  The femoral arteries are palpable and there is no bruit.  The pedal pulses are intact, posterior tibial and dorsalis pedis bilaterally.  Her legs reveal no edema.  LABORATORY DATA:  The EKG taken in the emergency room shows sinus rhythm, left atrial enlargement, and no ST-T segment abnormality.  Telemetry in the emergency room showed frequent runs of atrial fibrillation with a rapid rate.  ASSESSMENT:  This 77 year old woman has had presyncope, with atrial fibrillation and rapid rate, no doubt the cause of her symptoms.  She needs hospitalization to gain adequate control of her atrial fibrillation to reduce her likelihood of falling.  We will also obtain serial enzymes to screen for significant inducible ischemia. Dictated by:   Jaclyn Prime. Lucas Mallow, M.D. Attending Physician:  Clovis Cao DD:  07/23/01 TD:  07/24/01 Job: [id number]

## 2011-03-08 NOTE — Op Note (Signed)
NAMEJASHLEY, [name]                ACCOUNT NO.:  [id number]   MEDICAL RECORD NO.:  [id number]          PATIENT TYPE:  AMB   LOCATION:  ENDO                         FACILITY:  [facility]   PHYSICIAN:  Georgiana Spinner, M.D.    DATE OF BIRTH:  [birth year]   DATE OF PROCEDURE:  12/03/2004  DATE OF DISCHARGE:                                 OPERATIVE REPORT   PROCEDURE:  Colonoscopy.   INDICATIONS:  Colon cancer screening.   ANESTHESIA:  Demerol 70, Versed 7 mg.   DESCRIPTION OF PROCEDURE:  With the patient mildly sedated in the left  lateral decubitus position, a rectal examination was performed which  revealed that she had decreased rectal tone and could not really cause much  pressure on my examining finger. Subsequently the Olympus videoscopic  colonoscope was inserted in the rectum and passed under direct vision to  cecum identified by ileocecal valve and appendiceal orifice both of which  were photographed. From this point, the colonoscope was slowly withdrawn  taking circumferential views of the colonic mucosa stopping to photograph  diverticulosis that we saw in the sigmoid colon as we removed all the way to  the rectum which appeared normal on direct showed hemorrhoids on retroflexed  view. The endoscope was straightened and withdrawn. The patient's vital  signs and pulse oximeter remained stable. The patient tolerated procedure  well without apparent complications.   FINDINGS:  Diverticulosis of sigmoid colon, internal hemorrhoids, otherwise  an unremarkable examination.   PLAN:  Consider repeat examination in 5-10 years.      GMO/MEDQ  D:  12/03/2004  T:  12/03/2004  Job:  [id number]

## 2011-03-08 NOTE — H&P (Signed)
[name], [name]                ACCOUNT NO.:  [id number]   MEDICAL RECORD NO.:  [id number]          PATIENT TYPE:  INP   LOCATION:  0102                         FACILITY:  [facility]   PHYSICIAN:  Isidor Holts, M.D.  DATE OF BIRTH:  08/15/1924   DATE OF ADMISSION:  02/14/2005  DATE OF DISCHARGE:                                HISTORY & PHYSICAL   PRIMARY CARE PHYSICIAN:  Jaclyn Prime. Lucas Mallow, M.D.   GASTROENTEROLOGIST:  Georgiana Spinner, M.D.   CHIEF COMPLAINT:  Abdominal pain, almost passed out.   HISTORY OF PRESENT ILLNESS:  This is a 77 year old female with a history of  paroxysmal atrial fibrillation, obstructive airways disease and  diverticulosis.  According to the patient, she went to bed at midnight on  February 13, 2005.  At about 1:30 a.m., she developed crampy lower abdominal  pain.  Denies vomiting or diarrhea.  She went to the bathroom but was unable  to move her bowels.  On her way back, she felt lightheaded and fell flat on  her face.  Denies chest pain, palpitations, or shortness of breath.  Denies  loss of consciousness.  She lay there on the floor for approximately 15  minutes, then crawled to the phone and called EMS.  She denies any ankle  swelling or discomfort.  The patient is fairly active, has had no recent  long distance travel.   PAST MEDICAL HISTORY:  1.  Paroxysmal atrial fibrillation/flutter in 2002, on anticoagulation.  2.  COPD/bronchial asthma.  3.  Smoker.  4.  Status post appendectomy.  5.  Status post partial hysterectomy and bladder tack in 1978.  6.  Macular degeneration.  7.  Status post bilateral lens implants.  8.  Diverticulosis.  9.  Cholelithiasis.  10. Seasonal allergies.   MEDICATIONS:  1.  Verapamil SR 240 mg p.o. q.d.  2.  Captopril 25 mg p.o. b.i.d.  3.  Coumadin 2.5 mg on Thursdays, 5 mg all other days.  4.  Benadryl OTC p.r.n.  5.  Bronchodilator inhaler (which?) p.r.n.   ALLERGIES:  No known drug allergies.   REVIEW OF SYSTEMS:   As per HPI and chief complaint, otherwise negative.   SOCIAL/FAMILY HISTORY:  The patient is a smoker, smokes one pack of  cigarettes per day for the past 63 years.  Nondrinker.  No history of drug  abuse.  She is divorced.  She lives alone.  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 in [place]; works 2-1/2 days a week.  She has three offspring; one is wheelchair bound status post MVA, the rest  are alive and well.  Her mother died at age 40 years secondary to CHF; she  was diabetic.  Father died at age 58 status post MI; he was also diabetic.   PHYSICAL EXAMINATION:  VITAL SIGNS:  Temperature 97.6, pulse 82 per minute,  regular respiratory rate 18, BP 128/59 mmHg, pulse oximetry 93% on room air.  GENERAL:  The patient appears quite comfortable, alert, communicative,  oriented, and not short of breath at  rest.  HEENT:  No clinical pallor, no jaundice.  No conjunctival injections.  Throat is quite clear.  NECK:  Supple, JVP not seen.  No palpable lymphadenopathy.  No palpable  goiter.  No carotid bruits.  CHEST:  Clinically clear to auscultation.  No wheezes, no crackles.  HEART:  Sounds 1 and 2 heard.  Normal, no murmurs.  There are occasional  ectopics.  ABDOMEN:  Full, soft.  The patient has vague lower abdominal tenderness.  No  guarding, no rebound, normal bowel sounds.  There is no palpable  organomegaly.  EXTREMITIES:  Lower extremities:  Varicosities are noted.  No pitting edema.  Palpable peripheral pulses.  MUSCULOSKELETAL:  Arthritic changes but otherwise largely unremarkable.  CENTRAL NERVOUS SYSTEM:  No focal neurologic deficit on gross examination.   INVESTIGATIONS:  CBC:  WBC 18.0, hemoglobin 14.8, hematocrit 45.7, platelets  340.  Electrolytes:  Sodium 140, potassium 3.4, chloride 105, CO2 28, BUN  17, creatinine 1.0, glucose 116.  AST 27, ALT 18, alkaline phosphatase 113.  INR 2.3.  Urinalysis negative.  Chest x-ray dated February 14, 2005,  with no  acute findings.  Abdominal/pelvic CT scan February 14, 2005, shows possible  right lower lobe pulmonary embolism, stable 8-mm left lower lobe nodule,  gallstones, biliary dilatation in liver and dilated pancreatic duct, also  low density filling defects in duodenum next to ampulla of Vater,  inflammatory changes consistent with diverticulitis noted in the splenic  flexure.  EKG shows sinus rhythm, regular, 82 per minute, normal axis, left  bundle branch block pattern.   ASSESSMENT AND PLAN:  1.  Near syncope, unclear etiology.  The patient has a history of paroxysmal      atrial fibrillation.  This may have been the culprit secondary to      transient tachyarrhythmia.  The patient is in sinus rhythm at this time      and is asymptomatic.  We will admit for telemetry monitoring and cycle      cardiac enzymes.  EKG shows left bundle branch block pattern which is      unchanged since August 2004.  We shall order for completeness carotid      and vertebral duplex, do echocardiogram, and brain MRI.   1.  Query possible pulmonary embolism in right lower lobe on chest CT scan.      The patient is already on anticoagulation for paroxysmal atrial      fibrillation and INR is therapeutic at 2.3.  She is now status post CT      angiogram which does not show any evidence of pulmonary embolism.   1.  Abdominal pain.  Abdominal CT scan confirms colonic diverticulitis.  We      will manage with ciprofloxacin and Flagyl, intravenous fluids and clear      fluids for now.   1.  Abdominal CT finding of biliary/pancreatic duct dilatation and duodenal      filling defects near ampulla of Vater.  Review of abdominal CT scan      reports of May 22, 2003, also indicated pancreatic duct dilatation of      unclear etiology at that time.  The patient does have cholelithiasis,     albeit asymptomatic.  We will request GI consultation which we expect      will be provided by Dr. Virginia Rochester, as ERCP may be  indicated.  Management will      otherwise, be per GI recommendations.  It is noted that the patient  had      a colonoscopy on December 06, 2004, which showed diverticulosis and      internal hemorrhoids but was otherwise unremarkable.   1.  History of chronic obstructive pulmonary disease/smoking.  Will manage      with p.r.n. bronchodilators, nebulizers, and provide smoking cessation      counseling.  Further management will depend on clinical course.      CO/MEDQ  D:  02/14/2005  T:  02/14/2005  Job:  [id number]   cc:   Jaclyn Prime. Lucas Mallow, M.D.  [address]  [place]  [place] [address]  Fax: [phone]   Georgiana Spinner, M.D.  [address]  [place]  [place] [address]  Fax: [phone]

## 2011-07-16 LAB — DIFFERENTIAL
Basophils Absolute: 0
Basophils Relative: 1
Basophils Relative: 0
Eosinophils Absolute: 0.2
Eosinophils Relative: 2
Lymphocytes Relative: 26
Lymphocytes Relative: 24
Monocytes Relative: 6
Monocytes Relative: 9
Neutro Abs: 5.7
Neutro Abs: 6.8
Neutrophils Relative %: 65
Neutrophils Relative %: 62

## 2011-07-16 LAB — CBC
HCT: 39.9
HCT: 33 — ABNORMAL LOW
Hemoglobin: 11 — ABNORMAL LOW
MCHC: 32.6
MCHC: 33.1
MCV: 87.1
Platelets: 381
RBC: 4.58
RBC: 3.71 — ABNORMAL LOW
RBC: 3.79 — ABNORMAL LOW
RBC: 3.99
RDW: 14.4
WBC: 11.6 — ABNORMAL HIGH
WBC: 11.9 — ABNORMAL HIGH

## 2011-07-16 LAB — PREPARE FRESH FROZEN PLASMA

## 2011-07-16 LAB — TRICYCLICS SCREEN, URINE: TCA Scrn: NOT DETECTED

## 2011-07-16 LAB — URINE MICROSCOPIC-ADD ON

## 2011-07-16 LAB — BASIC METABOLIC PANEL
CO2: 29
Calcium: 9.1
Chloride: 102
Chloride: 97
Creatinine, Ser: 0.58
Creatinine, Ser: 0.58
GFR calc Af Amer: >60
GFR calc Af Amer: >60
Potassium: 3.8
Sodium: 139

## 2011-07-16 LAB — COMPREHENSIVE METABOLIC PANEL
ALT: 25
AST: 32
Alkaline Phosphatase: 94
BUN: 12
Chloride: 103
Potassium: 3.8
Total Bilirubin: 0.9

## 2011-07-16 LAB — URINE CULTURE: Culture: NO GROWTH

## 2011-07-16 LAB — URINALYSIS, ROUTINE W REFLEX MICROSCOPIC
Bilirubin Urine: NEGATIVE
Glucose, UA: NEGATIVE
Nitrite: NEGATIVE
Specific Gravity, Urine: 1.019
Urobilinogen, UA: 0.2
pH: 6

## 2011-07-16 LAB — ABO/RH
ABO/RH(D): O POS
ABO/RH(D): O POS

## 2011-07-16 LAB — ETHANOL: Alcohol, Ethyl (B): <5

## 2011-07-16 LAB — PROTIME-INR
INR: 1
Prothrombin Time: 31.5 — ABNORMAL HIGH
Prothrombin Time: 16.4 — ABNORMAL HIGH

## 2011-07-16 LAB — TYPE AND SCREEN
ABO/RH(D): O POS
Antibody Screen: NEGATIVE

## 2011-07-16 LAB — PHOSPHORUS: Phosphorus: 3.6

## 2011-07-16 LAB — MAGNESIUM: Magnesium: 2

## 2011-07-16 LAB — CALCIUM: Calcium: 8.5

## 2011-07-16 LAB — SALICYLATE LEVEL: Salicylate Lvl: <4

## 2011-07-17 LAB — COMPREHENSIVE METABOLIC PANEL
ALT: 16
AST: 23
Albumin: 3.3 — ABNORMAL LOW
Alkaline Phosphatase: 109
Alkaline Phosphatase: 87
BUN: 32 — ABNORMAL HIGH
CO2: 30
Calcium: 10.1
Chloride: 109
Creatinine, Ser: 0.79
GFR calc Af Amer: 46 — ABNORMAL LOW
GFR calc Af Amer: >60
GFR calc non Af Amer: >60
Potassium: 3.2 — ABNORMAL LOW
Potassium: 4.4
Sodium: 142
Total Bilirubin: 1
Total Protein: 6.9

## 2011-07-17 LAB — URINALYSIS, MICROSCOPIC ONLY
Glucose, UA: NEGATIVE
Protein, ur: NEGATIVE
pH: 5

## 2011-07-17 LAB — CBC
HCT: 31.6 — ABNORMAL LOW
HCT: 33.3 — ABNORMAL LOW
HCT: 33.8 — ABNORMAL LOW
HCT: 37.9
Hemoglobin: 11.1 — ABNORMAL LOW
Hemoglobin: 12.5
MCHC: 33.1
MCV: 88.5
Platelets: 326
Platelets: 450 — ABNORMAL HIGH
RBC: 3.77 — ABNORMAL LOW
RBC: 3.83 — ABNORMAL LOW
RBC: 4.26
RDW: 14.3
RDW: 14.4
WBC: 16.5 — ABNORMAL HIGH
WBC: 22.9 — ABNORMAL HIGH
WBC: 23.3 — ABNORMAL HIGH
WBC: 9.1

## 2011-07-17 LAB — URINALYSIS, ROUTINE W REFLEX MICROSCOPIC
Glucose, UA: NEGATIVE
Hgb urine dipstick: NEGATIVE
Protein, ur: 100 — AB
Specific Gravity, Urine: 1.017
Urobilinogen, UA: 1

## 2011-07-17 LAB — URINE CULTURE
Colony Count: NO GROWTH
Culture: NO GROWTH
Special Requests: NEGATIVE

## 2011-07-17 LAB — CLOSTRIDIUM DIFFICILE EIA: C difficile Toxins A+B, EIA: NEGATIVE

## 2011-07-17 LAB — BASIC METABOLIC PANEL
BUN: 19
Calcium: 8.7
Creatinine, Ser: 0.59
GFR calc non Af Amer: >60
Glucose, Bld: 96
Potassium: 4

## 2011-07-17 LAB — B-NATRIURETIC PEPTIDE (CONVERTED LAB): Pro B Natriuretic peptide (BNP): 154 — ABNORMAL HIGH

## 2011-07-17 LAB — FECAL LACTOFERRIN, QUANT

## 2011-07-17 LAB — DIFFERENTIAL
Basophils Relative: 0
Eosinophils Relative: 0
Lymphs Abs: 0.9
Monocytes Absolute: 1.2 — ABNORMAL HIGH
Monocytes Relative: 5

## 2011-07-17 LAB — OVA AND PARASITE EXAMINATION

## 2011-07-17 LAB — URINE MICROSCOPIC-ADD ON

## 2011-07-17 LAB — CK TOTAL AND CKMB (NOT AT ARMC)
Relative Index: INVALID
Total CK: 23

## 2011-07-17 LAB — APTT: aPTT: 45 — ABNORMAL HIGH

## 2011-07-17 LAB — LACTIC ACID, PLASMA
Lactic Acid, Venous: 0.6
Lactic Acid, Venous: 0.7
Lactic Acid, Venous: 2.7 — ABNORMAL HIGH

## 2011-07-17 LAB — MAGNESIUM: Magnesium: 2.6 — ABNORMAL HIGH

## 2011-09-06 ENCOUNTER — Encounter (INDEPENDENT_AMBULATORY_CARE_PROVIDER_SITE_OTHER): Payer: Medicare Other | Admitting: Ophthalmology

## 2011-09-06 DIAGNOSIS — H3530 Unspecified macular degeneration: Secondary | ICD-10-CM

## 2011-09-06 DIAGNOSIS — H43819 Vitreous degeneration, unspecified eye: Secondary | ICD-10-CM

## 2012-04-06 ENCOUNTER — Ambulatory Visit (INDEPENDENT_AMBULATORY_CARE_PROVIDER_SITE_OTHER): Payer: Medicare Other | Admitting: Emergency Medicine

## 2012-04-06 VITALS — BP 170/66 | HR 63 | Temp 98.8°F | Resp 18 | Wt 159.0 lb

## 2012-04-06 DIAGNOSIS — R0989 Other specified symptoms and signs involving the circulatory and respiratory systems: Secondary | ICD-10-CM

## 2012-04-06 DIAGNOSIS — I4891 Unspecified atrial fibrillation: Secondary | ICD-10-CM | POA: Insufficient documentation

## 2012-04-06 DIAGNOSIS — R0600 Dyspnea, unspecified: Secondary | ICD-10-CM

## 2012-04-06 DIAGNOSIS — J449 Chronic obstructive pulmonary disease, unspecified: Secondary | ICD-10-CM | POA: Insufficient documentation

## 2012-04-06 MED ORDER — IPRATROPIUM BROMIDE 0.02 % IN SOLN
0.5000 mg | Freq: Once | RESPIRATORY_TRACT | Status: AC
  Administered 2012-04-06: 0.5 mg via RESPIRATORY_TRACT

## 2012-04-06 MED ORDER — IPRATROPIUM BROMIDE 0.02 % IN SOLN: 250.0000 ug | Freq: Four times a day (QID) | RESPIRATORY_TRACT | Status: DC

## 2012-04-06 MED ORDER — ALBUTEROL SULFATE (2.5 MG/3ML) 0.083% IN NEBU: 2.5000 mg | INHALATION_SOLUTION | RESPIRATORY_TRACT | Status: DC | PRN

## 2012-04-06 MED ORDER — ALBUTEROL SULFATE (2.5 MG/3ML) 0.083% IN NEBU
2.5000 mg | INHALATION_SOLUTION | Freq: Once | RESPIRATORY_TRACT | Status: AC
  Administered 2012-04-06: 2.5 mg via RESPIRATORY_TRACT

## 2012-04-06 NOTE — Progress Notes (Signed)
  Subjective:    Patient ID: [name], female    DOB: [birth year], 78 y.o.   MRN: [id number]  Shortness of Breath This is a recurrent problem. The current episode started in the past 7 days. The problem occurs constantly. The problem has been gradually worsening. Associated symptoms include orthopnea, PND, sputum production and wheezing. Pertinent negatives include no abdominal pain, chest pain, claudication, coryza, ear pain, headaches, hemoptysis, leg pain, leg swelling, neck pain, rash, rhinorrhea, sore throat, swollen glands, syncope or vomiting. The symptoms are aggravated by exercise, lying flat, odors, weather changes and URIs. Risk factors include smoking. She has tried beta agonist inhalers and ipratropium inhalers for the symptoms. The treatment provided mild relief. Her past medical history is significant for asthma, chronic lung disease and COPD. There is no history of allergies, aspirin allergies, bronchiolitis, CAD, DVT, a heart failure, PE, pneumonia or a recent surgery.  Asthma She complains of cough, shortness of breath, sputum production and wheezing. There is no hemoptysis. Associated symptoms include appetite change and PND. Pertinent negatives include no chest pain, ear pain, headaches, rhinorrhea or sore throat. Her past medical history is significant for asthma and COPD. There is no history of pneumonia.      Review of Systems  Constitutional: Positive for activity change, appetite change and fatigue. Negative for chills, diaphoresis and unexpected weight change.  HENT: Negative for ear pain, sore throat, rhinorrhea and neck pain.   Eyes: Negative.   Respiratory: Positive for cough, sputum production, shortness of breath and wheezing. Negative for apnea, hemoptysis and chest tightness.   Cardiovascular: Positive for orthopnea and PND. Negative for chest pain, palpitations, claudication, leg swelling and syncope.  Gastrointestinal: Negative.  Negative for vomiting and  abdominal pain.  Genitourinary: Negative.   Musculoskeletal: Negative.   Skin: Negative for rash.  Neurological: Negative.  Negative for headaches.       Objective:   Physical Exam        Assessment & Plan:  Out of inhaler medication.  Cough productive of yellow sputum.  No fever or chills.   Improved some with neb in office Encouraged to stop smoking. Start antibiotics and continue nebs  Follow up with FMD in 1 week.

## 2012-09-07 ENCOUNTER — Encounter (INDEPENDENT_AMBULATORY_CARE_PROVIDER_SITE_OTHER): Payer: Medicare Other | Admitting: Ophthalmology

## 2012-09-07 DIAGNOSIS — H43819 Vitreous degeneration, unspecified eye: Secondary | ICD-10-CM

## 2012-09-07 DIAGNOSIS — H35039 Hypertensive retinopathy, unspecified eye: Secondary | ICD-10-CM

## 2012-09-07 DIAGNOSIS — I10 Essential (primary) hypertension: Secondary | ICD-10-CM

## 2012-09-07 DIAGNOSIS — H3530 Unspecified macular degeneration: Secondary | ICD-10-CM

## 2013-07-30 ENCOUNTER — Encounter: Payer: Self-pay | Admitting: Cardiovascular Disease

## 2013-07-30 ENCOUNTER — Ambulatory Visit (INDEPENDENT_AMBULATORY_CARE_PROVIDER_SITE_OTHER): Payer: Medicare Other | Admitting: Cardiovascular Disease

## 2013-07-30 VITALS — BP 128/50 | HR 59 | Ht 67.5 in | Wt 152.2 lb

## 2013-07-30 DIAGNOSIS — E785 Hyperlipidemia, unspecified: Secondary | ICD-10-CM | POA: Insufficient documentation

## 2013-07-30 DIAGNOSIS — R079 Chest pain, unspecified: Secondary | ICD-10-CM

## 2013-07-30 DIAGNOSIS — Z8249 Family history of ischemic heart disease and other diseases of the circulatory system: Secondary | ICD-10-CM | POA: Insufficient documentation

## 2013-07-30 DIAGNOSIS — I10 Essential (primary) hypertension: Secondary | ICD-10-CM | POA: Insufficient documentation

## 2013-07-30 DIAGNOSIS — R0600 Dyspnea, unspecified: Secondary | ICD-10-CM

## 2013-07-30 DIAGNOSIS — I4891 Unspecified atrial fibrillation: Secondary | ICD-10-CM

## 2013-07-30 DIAGNOSIS — I480 Paroxysmal atrial fibrillation: Secondary | ICD-10-CM | POA: Insufficient documentation

## 2013-07-30 DIAGNOSIS — R0609 Other forms of dyspnea: Secondary | ICD-10-CM

## 2013-07-30 DIAGNOSIS — Z720 Tobacco use: Secondary | ICD-10-CM | POA: Insufficient documentation

## 2013-07-30 NOTE — Assessment & Plan Note (Signed)
Patient has multiple cardiac risk factors including tobacco abuse, hypertension, hyperlipidemia and family history. She's had chest pain that occurs daily for the last 3 months not necessarily associated with exertion. I'm going to get a pharmacologic Myoview stress test to rule out an ischemic etiology.

## 2013-07-30 NOTE — Assessment & Plan Note (Signed)
Currently in sinus rhythm. She does not appear to be an anti-coagulation candidate

## 2013-07-30 NOTE — Progress Notes (Signed)
07/30/2013 [name]   [birth year]  [id number]  Primary Physician Brandi Fick, MD Primary Cardiologist: Runell Gess MD [name]   HPI:  [name] is a 79 year old divorced Caucasian female mother of 3, grandmother great-grandchildren referred to me by Dr. Nicholos Vaughn. Her cardiac risk factor profile is positive for 70 pack year history of tobacco abuse, hypertension, hyperlipidemia and family history. Her brother had bypass surgery and her father died sudden cardiac death. She has never had a heart attack or stroke. She planes of dyspnea on exertion and a 3 month history of daily chest pain.   Current Outpatient Prescriptions  Medication Sig Dispense Refill  . albuterol (PROVENTIL HFA;VENTOLIN HFA) 108 (90 BASE) MCG/ACT inhaler Inhale 2 puffs into the lungs every 6 (six) hours as needed.      Marland Kitchen albuterol (PROVENTIL) (2.5 MG/3ML) 0.083% nebulizer solution Take 3 mLs (2.5 mg total) by nebulization every 4 (four) hours as needed for wheezing.  150 vial  1  . aspirin 325 MG tablet Take 325 mg by mouth daily.      Marland Kitchen atorvastatin (LIPITOR) 40 MG tablet Every other day      . captopril (CAPOTEN) 50 MG tablet Take 50 mg by mouth 2 (two) times daily.       . fish oil-omega-3 fatty acids 1000 MG capsule Take 2 g by mouth daily.      Marland Kitchen ipratropium (ATROVENT) 0.02 % nebulizer solution Take 1.25 mLs (250 mcg total) by nebulization 4 (four) times daily.  75 mL  12  . metoprolol succinate (TOPROL-XL) 50 MG 24 hr tablet Take 50 mg by mouth daily. 25 mg at night..      . Multiple Vitamin (MULTIVITAMIN WITH MINERALS) TABS tablet Take 1 tablet by mouth daily.      . verapamil (COVERA HS) 240 MG (CO) 24 hr tablet Take 240 mg by mouth at bedtime.       No current facility-administered medications for this visit.    No Known Allergies  History   Social History  . Marital Status: Divorced    Spouse Name: N/A    Number of Children: N/A  . Years of Education: N/A    Occupational History  . Not on file.   Social History Main Topics  . Smoking status: Current Every Day Smoker    Types: Cigarettes    Start date: 07/30/1945  . Smokeless tobacco: Not on file  . Alcohol Use: No  . Drug Use: No  . Sexual Activity: Not on file   Other Topics Concern  . Not on file   Social History Narrative  . No narrative on file     Review of Systems: General: negative for chills, fever, night sweats or weight changes.  Cardiovascular: negative for chest pain, dyspnea on exertion, edema, orthopnea, palpitations, paroxysmal nocturnal dyspnea or shortness of breath Dermatological: negative for rash Respiratory: negative for cough or wheezing Urologic: negative for hematuria Abdominal: negative for nausea, vomiting, diarrhea, bright red blood per rectum, melena, or hematemesis Neurologic: negative for visual changes, syncope, or dizziness All other systems reviewed and are otherwise negative except as noted above.    Blood pressure 128/50, pulse 59, height 5' 7.5" (1.715 m), weight 152 lb 3.2 oz (69.037 kg).  General appearance: alert and no distress Neck: no adenopathy, no carotid bruit, no JVD, supple, symmetrical, trachea midline and thyroid not enlarged, symmetric, no tenderness/mass/nodules Lungs: clear to auscultation bilaterally Heart: regular rate and rhythm, S1, S2 normal, no  murmur, click, rub or gallop Abdomen: soft, non-tender; bowel sounds normal; no masses,  no organomegaly Extremities: extremities normal, atraumatic, no cyanosis or edema Pulses: 2+ and symmetric  EKG sinus bradycardia at 59 with a bundle-branch block  ASSESSMENT AND PLAN:   Chest pain Patient has multiple cardiac risk factors including tobacco abuse, hypertension, hyperlipidemia and family history. She's had chest pain that occurs daily for the last 3 months not necessarily associated with exertion. I'm going to get a pharmacologic Myoview stress test to rule out an  ischemic etiology.  Dyspnea on exertion vvision has 70-pack-year history of tobacco abuse. She does complain of dyspnea on exertion. She has multiple cardiac risk factors in addition to this. I'm going to get a 2-D echo to evaluate LV function.  Atrial fibrillation Currently in sinus rhythm. She does not appear to be an anti-coagulation candidate  Essential hypertension Controlled on current medications  Hyperlipidemia On statin therapy followed by her PCP. Her most recent lipid profile performed on 5:30/14 revealed a total cholesterol of 105, LDL of 42 and HDL of 39      Runell Gess MD [facility], [facility] 07/30/2013 9:31 AM

## 2013-07-30 NOTE — Assessment & Plan Note (Signed)
vvision has 70-pack-year history of tobacco abuse. She does complain of dyspnea on exertion. She has multiple cardiac risk factors in addition to this. I'm going to get a 2-D echo to evaluate LV function.

## 2013-07-30 NOTE — Assessment & Plan Note (Signed)
Controlled on current medications 

## 2013-07-30 NOTE — Patient Instructions (Signed)
Your physician recommends that you schedule a follow-up appointment in: 2 weeks  Your physician has requested that you have an echocardiogram. Echocardiography is a painless test that uses sound waves to create images of your heart. It provides your doctor with information about the size and shape of your heart and how well your heart's chambers and valves are working. This procedure takes approximately one hour. There are no restrictions for this procedure.  Your physician has requested that you have a lexiscan myoview. For further information please visit [email or web address] Please follow instruction sheet, as given.

## 2013-07-30 NOTE — Assessment & Plan Note (Signed)
On statin therapy followed by her PCP. Her most recent lipid profile performed on 5:30/14 revealed a total cholesterol of 105, LDL of 42 and HDL of 39

## 2013-08-10 ENCOUNTER — Ambulatory Visit (HOSPITAL_COMMUNITY)
Admission: RE | Admit: 2013-08-10 | Discharge: 2013-08-10 | Disposition: A | Discharge status: Home / Self Care | Payer: Medicare Other | Source: Ambulatory Visit | Attending: Cardiovascular Disease | Admitting: Cardiovascular Disease

## 2013-08-10 DIAGNOSIS — R079 Chest pain, unspecified: Secondary | ICD-10-CM | POA: Insufficient documentation

## 2013-08-10 DIAGNOSIS — I10 Essential (primary) hypertension: Secondary | ICD-10-CM | POA: Insufficient documentation

## 2013-08-10 DIAGNOSIS — R002 Palpitations: Secondary | ICD-10-CM | POA: Insufficient documentation

## 2013-08-10 DIAGNOSIS — J4489 Other specified chronic obstructive pulmonary disease: Secondary | ICD-10-CM | POA: Insufficient documentation

## 2013-08-10 DIAGNOSIS — Z8249 Family history of ischemic heart disease and other diseases of the circulatory system: Secondary | ICD-10-CM | POA: Insufficient documentation

## 2013-08-10 DIAGNOSIS — I4891 Unspecified atrial fibrillation: Secondary | ICD-10-CM | POA: Insufficient documentation

## 2013-08-10 DIAGNOSIS — R0609 Other forms of dyspnea: Secondary | ICD-10-CM | POA: Insufficient documentation

## 2013-08-10 DIAGNOSIS — J449 Chronic obstructive pulmonary disease, unspecified: Secondary | ICD-10-CM | POA: Insufficient documentation

## 2013-08-10 DIAGNOSIS — R5381 Other malaise: Secondary | ICD-10-CM | POA: Insufficient documentation

## 2013-08-10 DIAGNOSIS — F17200 Nicotine dependence, unspecified, uncomplicated: Secondary | ICD-10-CM | POA: Insufficient documentation

## 2013-08-10 DIAGNOSIS — R0989 Other specified symptoms and signs involving the circulatory and respiratory systems: Secondary | ICD-10-CM | POA: Insufficient documentation

## 2013-08-10 MED ORDER — TECHNETIUM TC 99M SESTAMIBI GENERIC - CARDIOLITE
11.0000 | Freq: Once | INTRAVENOUS | Status: AC | PRN
  Administered 2013-08-10: 11 via INTRAVENOUS

## 2013-08-10 MED ORDER — TECHNETIUM TC 99M SESTAMIBI GENERIC - CARDIOLITE
32.0000 | Freq: Once | INTRAVENOUS | Status: AC | PRN
  Administered 2013-08-10: 32 via INTRAVENOUS

## 2013-08-10 MED ORDER — AMINOPHYLLINE 25 MG/ML IV SOLN
75.0000 mg | Freq: Once | INTRAVENOUS | Status: AC
  Administered 2013-08-10: 75 mg via INTRAVENOUS

## 2013-08-10 MED ORDER — REGADENOSON 0.4 MG/5ML IV SOLN
0.4000 mg | Freq: Once | INTRAVENOUS | Status: AC
  Administered 2013-08-10: 0.4 mg via INTRAVENOUS

## 2013-08-10 NOTE — Procedures (Addendum)
[name] [address] [place] [address] [phone]  Cardiology Nuclear Med Study  [name] is a 79 y.o. female     MRN : [id number]     DOB: 1924/01/02  Procedure Date: 08/10/2013  Nuclear Med Background Indication for Stress Test:  Evaluation for Ischemia History:  COPD and PAF Cardiac Risk Factors: Family History - CAD, Hypertension, Lipids and Smoker  Symptoms:  Chest Pain, DOE, Fatigue and Palpitations   Nuclear Pre-Procedure Caffeine/Decaff Intake:  9:00pm NPO After: 7:00am   IV Site: R Forearm  IV 0.9% NS with Angio Cath:  22g  Chest Size (in):  N/A IV Started by: Emmit Pomfret, RN  Height: 5' 7.5" (1.715 m)  Cup Size: B  BMI:  Body mass index is 23.44 kg/(m^2). Weight:  152 lb (68.947 kg)   Tech Comments:  N/A    Nuclear Med Study 1 or 2 day study: 1 day  Stress Test Type:  Lexiscan  Order Authorizing Provider:  Nanetta Batty, MD   Resting Radionuclide: Technetium 81m Sestamibi  Resting Radionuclide Dose: 11.0 mCi   Stress Radionuclide:  Technetium 34m Sestamibi  Stress Radionuclide Dose: 32.0 mCi           Stress Protocol Rest HR: 55 Stress HR: 62  Rest BP: 142/70 Stress BP: 147/60  Exercise Time (min): n/a METS: n/a   Predicted Max HR: 133 bpm % Max HR: 54.14 bpm Rate Pressure Product: 21308  Dose of Adenosine (mg):  n/a Dose of Lexiscan: 0.4 mg  Dose of Atropine (mg): n/a Dose of Dobutamine: n/a mcg/kg/min (at max HR)  Stress Test Technologist: Esperanza Sheets, CCT Nuclear Technologist: Gonzella Lex, CNMT   Rest Procedure:  Myocardial perfusion imaging was performed at rest 45 minutes following the intravenous administration of Technetium 80m Sestamibi. Stress Procedure:  The patient received IV Lexiscan 0.4 mg over 15-seconds.  Technetium 50m Sestamibi injected at 30-seconds.  The patient experienced SOB, nausea, stomach pain and fatigue; 75 mg of IV Aminophylline was administered  with resolution of symptoms.  There were no significant changes with Lexiscan.  Quantitative spect images were obtained after a 45 minute delay.  Transient Ischemic Dilatation (Normal <1.22):  0.99 Lung/Heart Ratio (Normal <0.45):  0.23 QGS EDV:  87 ml QGS ESV:  25 ml LV Ejection Fraction: 71%     Rest ECG: NSR-LBBB  Stress ECG: No significant change from baseline ECG  QPS Raw Data Images:  There is a breast shadow that accounts for the anterior attenuation. Stress Images:  There is decreased uptake in the anterior wall. Rest Images:  Comparison with the stress images reveals no significant change. Subtraction (SDS):  There is a fixed anteriour defect that is most consistent with breast attenuation. LV Wall Motion:  NL LV Function; NL Wall Motion  Impression Exercise Capacity:  Lexiscan with no exercise. BP Response:  Normal blood pressure response. Clinical Symptoms:  No symptoms. ECG Impression:  No significant ECG changes with Lexiscan. Comparison with Prior Nuclear Study: No previous nuclear study performed   Overall Impression:  Normal stress nuclear study.   Thurmon Fair, MD  08/10/2013 1:19 PM

## 2013-08-14 ENCOUNTER — Encounter: Payer: Self-pay | Admitting: *Deleted

## 2013-08-16 ENCOUNTER — Ambulatory Visit (HOSPITAL_COMMUNITY)
Admission: RE | Admit: 2013-08-16 | Discharge: 2013-08-16 | Disposition: A | Discharge status: Home / Self Care | Payer: Medicare Other | Source: Ambulatory Visit | Attending: Internal Medicine | Admitting: Internal Medicine

## 2013-08-16 DIAGNOSIS — R0609 Other forms of dyspnea: Secondary | ICD-10-CM | POA: Insufficient documentation

## 2013-08-16 DIAGNOSIS — R0600 Dyspnea, unspecified: Secondary | ICD-10-CM

## 2013-08-16 DIAGNOSIS — R0989 Other specified symptoms and signs involving the circulatory and respiratory systems: Secondary | ICD-10-CM | POA: Insufficient documentation

## 2013-08-16 NOTE — Progress Notes (Signed)
2D Echo Performed 08/16/2013    Clearence Ped, RCS

## 2013-08-17 ENCOUNTER — Encounter: Payer: Self-pay | Admitting: *Deleted

## 2013-08-18 ENCOUNTER — Encounter: Payer: Self-pay | Admitting: Cardiovascular Disease

## 2013-08-18 ENCOUNTER — Ambulatory Visit (INDEPENDENT_AMBULATORY_CARE_PROVIDER_SITE_OTHER): Payer: Medicare Other | Admitting: Cardiovascular Disease

## 2013-08-18 VITALS — BP 120/60 | HR 74 | Ht 67.5 in | Wt 154.0 lb

## 2013-08-18 DIAGNOSIS — R079 Chest pain, unspecified: Secondary | ICD-10-CM

## 2013-08-18 DIAGNOSIS — R0609 Other forms of dyspnea: Secondary | ICD-10-CM

## 2013-08-18 NOTE — Assessment & Plan Note (Signed)
A Myoview stress test was performed that was entirely normal. I reassured the patient that in all likelihood her chest pain is noncardiac in nature.

## 2013-08-18 NOTE — Assessment & Plan Note (Signed)
I obtained a 2-D echocardiogram which was entirely normal. LV function was normal as was valvular function.

## 2013-08-18 NOTE — Patient Instructions (Signed)
Your physician recommends that you schedule a follow-up appointment in: 6 Months with PA   Your physician recommends that you schedule a follow-up appointment in: 1 year with Dr Allyson Sabal

## 2013-08-18 NOTE — Progress Notes (Signed)
08/18/2013 [name]   09-22-1924  [id number]  Primary Physician Georgianne Fick, MD Primary Cardiologist: Runell Gess MD [name]   HPI:  [name] is a 79 year old divorced Caucasian female mother of 3, grandmother great-grandchildren referred to me by Dr. Nicholos Johns. Her cardiac risk factor profile is positive for 70 pack year history of tobacco abuse, hypertension, hyperlipidemia and family history. Her brother had bypass surgery and her father died sudden cardiac death. She has never had a heart attack or stroke. She planes of dyspnea on exertion and a 3 month history of daily chest pain. Because of her symptoms I performed a 2-D echocardiogram which was entirely normal and a Myoview stress test which likewise was normal. I reassured [name] that her chest pain is most likely noncardiac.    Current Outpatient Prescriptions  Medication Sig Dispense Refill  . albuterol (PROVENTIL HFA;VENTOLIN HFA) 108 (90 BASE) MCG/ACT inhaler Inhale 2 puffs into the lungs every 6 (six) hours as needed.      Marland Kitchen albuterol (PROVENTIL) (2.5 MG/3ML) 0.083% nebulizer solution Take 3 mLs (2.5 mg total) by nebulization every 4 (four) hours as needed for wheezing.  150 vial  1  . aspirin 325 MG tablet Take 325 mg by mouth daily.      Marland Kitchen atorvastatin (LIPITOR) 40 MG tablet Every other day      . captopril (CAPOTEN) 50 MG tablet Take 50 mg by mouth 2 (two) times daily.       . fish oil-omega-3 fatty acids 1000 MG capsule Take 2 g by mouth daily.      Marland Kitchen ipratropium (ATROVENT) 0.02 % nebulizer solution Take 1.25 mLs (250 mcg total) by nebulization 4 (four) times daily.  75 mL  12  . metoprolol succinate (TOPROL-XL) 50 MG 24 hr tablet Take 50 mg by mouth daily. 25 mg at night..      . Multiple Vitamin (MULTIVITAMIN WITH MINERALS) TABS tablet Take 1 tablet by mouth daily.      . verapamil (COVERA HS) 240 MG (CO) 24 hr tablet Take 240 mg by mouth at bedtime.       No current  facility-administered medications for this visit.    No Known Allergies  History   Social History  . Marital Status: Divorced    Spouse Name: N/A    Number of Children: N/A  . Years of Education: N/A   Occupational History  . Not on file.   Social History Main Topics  . Smoking status: Current Every Day Smoker    Types: Cigarettes    Start date: 07/30/1945  . Smokeless tobacco: Not on file  . Alcohol Use: No  . Drug Use: No  . Sexual Activity: Not on file   Other Topics Concern  . Not on file   Social History Narrative  . No narrative on file     Review of Systems: General: negative for chills, fever, night sweats or weight changes.  Cardiovascular: negative for chest pain, dyspnea on exertion, edema, orthopnea, palpitations, paroxysmal nocturnal dyspnea or shortness of breath Dermatological: negative for rash Respiratory: negative for cough or wheezing Urologic: negative for hematuria Abdominal: negative for nausea, vomiting, diarrhea, bright red blood per rectum, melena, or hematemesis Neurologic: negative for visual changes, syncope, or dizziness All other systems reviewed and are otherwise negative except as noted above.    Blood pressure 120/60, pulse 74, height 5' 7.5" (1.715 m), weight 154 lb (69.854 kg).  General appearance: alert and no distress  Neck: no adenopathy, no carotid bruit, no JVD, supple, symmetrical, trachea midline and thyroid not enlarged, symmetric, no tenderness/mass/nodules Lungs: clear to auscultation bilaterally Heart: regular rate and rhythm, S1, S2 normal, no murmur, click, rub or gallop Extremities: extremities normal, atraumatic, no cyanosis or edema  EKG not performed today  ASSESSMENT AND PLAN:   Dyspnea on exertion I obtained a 2-D echocardiogram which was entirely normal. LV function was normal as was valvular function.  Chest pain A Myoview stress test was performed that was entirely normal. I reassured the patient that  in all likelihood her chest pain is noncardiac in nature.      Runell Gess MD FACP,FACC,FAHA, [facility] 08/18/2013 12:38 PM

## 2013-09-01 ENCOUNTER — Ambulatory Visit (INDEPENDENT_AMBULATORY_CARE_PROVIDER_SITE_OTHER): Payer: Medicare Other | Admitting: Ophthalmology

## 2013-09-01 DIAGNOSIS — H3530 Unspecified macular degeneration: Secondary | ICD-10-CM

## 2013-09-01 DIAGNOSIS — H43819 Vitreous degeneration, unspecified eye: Secondary | ICD-10-CM

## 2013-09-01 DIAGNOSIS — I10 Essential (primary) hypertension: Secondary | ICD-10-CM

## 2013-09-01 DIAGNOSIS — H35039 Hypertensive retinopathy, unspecified eye: Secondary | ICD-10-CM

## 2013-09-08 ENCOUNTER — Ambulatory Visit (INDEPENDENT_AMBULATORY_CARE_PROVIDER_SITE_OTHER): Payer: Medicare Other | Admitting: Ophthalmology

## 2013-11-29 ENCOUNTER — Ambulatory Visit: Payer: Medicare Other

## 2013-11-29 ENCOUNTER — Ambulatory Visit (INDEPENDENT_AMBULATORY_CARE_PROVIDER_SITE_OTHER): Payer: Medicare Other | Admitting: Family Medicine

## 2013-11-29 VITALS — BP 120/84 | HR 55 | Temp 97.8°F | Resp 16 | Wt 151.0 lb

## 2013-11-29 DIAGNOSIS — M25559 Pain in unspecified hip: Secondary | ICD-10-CM

## 2013-11-29 DIAGNOSIS — J441 Chronic obstructive pulmonary disease with (acute) exacerbation: Secondary | ICD-10-CM

## 2013-11-29 DIAGNOSIS — M25551 Pain in right hip: Secondary | ICD-10-CM

## 2013-11-29 NOTE — Progress Notes (Signed)
[facility] [address] [place] [address] [phone]  Date:  11/29/2013   Name:  [name]   DOB:  1923-12-29   MRN:  [id number]  PCP:  Merrilee Seashore, MD    Chief Complaint: Hip Pain   History of Present Illness:  [name] is a 80 y.o. very pleasant female patient who presents with the following:  Today is Monday- on Saturday she fell onto her right hip. She "lost her balance."  She also thought that her right knee might have "given out" and contributed to her fall.  Did not hit her head.   No other history of falls.  No LOC.   She is able to walk ok but wants to double check that her hip is ok  History of COPD and unfortunately she does continue to smoke,  She otherwise "feels fine" and does not note any acute respiratory issues.  Feels that her breathing is a baseline, no particular SOB.  In 03/2012 her sat was 86% on RA. She does use oxygen at night but not during the day.  On recheck her saturation was much better.    She does have a walker at home which she can use- she borrowed a quad cane to use today.   Here today with her son.    Patient Active Problem List   Diagnosis Date Noted  . Chest pain 07/30/2013  . Paroxysmal atrial fibrillation 07/30/2013  . Tobacco abuse 07/30/2013  . Hyperlipidemia 07/30/2013  . Essential hypertension 07/30/2013  . Family history of heart disease 07/30/2013  . Dyspnea on exertion 07/30/2013  . Atrial fibrillation 04/06/2012  . COPD (chronic obstructive pulmonary disease) 04/06/2012    Past Medical History  Diagnosis Date  . Paroxysmal atrial fibrillation   . Left bundle branch block   . Hypertension   . Hyperlipidemia   . Chest pain   . Tobacco abuse   . COPD (chronic obstructive pulmonary disease)   . Personal history of subdural hematoma   . Cataract     Past Surgical History  Procedure Laterality Date  . Eye surgery    . Abdominal hysterectomy    . Appendectomy    . Brain  surgery      History  Substance Use Topics  . Smoking status: Current Every Day Smoker    Types: Cigarettes    Start date: 07/30/1945  . Smokeless tobacco: Not on file  . Alcohol Use: No    Family History  Problem Relation Age of Onset  . Diabetes Mother   . Heart disease Mother   . Heart disease Father     No Known Allergies  Medication list has been reviewed and updated.  Current Outpatient Prescriptions on File Prior to Visit  Medication Sig Dispense Refill  . albuterol (PROVENTIL HFA;VENTOLIN HFA) 108 (90 BASE) MCG/ACT inhaler Inhale 2 puffs into the lungs every 6 (six) hours as needed.      Marland Kitchen albuterol (PROVENTIL) (2.5 MG/3ML) 0.083% nebulizer solution Take 3 mLs (2.5 mg total) by nebulization every 4 (four) hours as needed for wheezing.  150 vial  1  . aspirin 325 MG tablet Take 325 mg by mouth daily.      Marland Kitchen atorvastatin (LIPITOR) 40 MG tablet Every other day      . captopril (CAPOTEN) 50 MG tablet Take 50 mg by mouth 2 (two) times daily.       . fish oil-omega-3 fatty acids 1000 MG capsule Take  2 g by mouth daily.      Marland Kitchen ipratropium (ATROVENT) 0.02 % nebulizer solution Take 1.25 mLs (250 mcg total) by nebulization 4 (four) times daily.  75 mL  12  . metoprolol succinate (TOPROL-XL) 50 MG 24 hr tablet Take 50 mg by mouth daily. 25 mg at night..      . Multiple Vitamin (MULTIVITAMIN WITH MINERALS) TABS tablet Take 1 tablet by mouth daily.      . verapamil (COVERA HS) 240 MG (CO) 24 hr tablet Take 240 mg by mouth at bedtime.       No current facility-administered medications on file prior to visit.    Review of Systems:  As per HPI- otherwise negative.   Physical Examination: Filed Vitals:   11/29/13 1259  BP: 90/62  Pulse: 69  Temp: 97.8 F (36.6 C)  Resp: 16   Filed Vitals:   11/29/13 1259  Weight: 151 lb (68.493 kg)   Body mass index is 23.29 kg/(m^2). Ideal Body Weight:    GEN: WDWN, NAD, Non-toxic, A & O x 3, older lady who is well appearing and  accompanied by her son today.  HEENT: Atraumatic, Normocephalic. Neck supple. No masses, No LAD. Ears and Nose: No external deformity. CV: RRR, No M/G/R. No JVD. No thrill. No extra heart sounds. PULM: CTA B, no wheezes, crackles, rhonchi. No retractions. No resp. distress. No accessory muscle use. ABD: S, NT, ND EXTR: No c/c/e NEURO Normal gait for age.  Does use a quad cane but does not favor the right hip  PSYCH: Normally interactive. Conversant. Not depressed or anxious appearing.  Calm demeanor.  She has mild tenderness over the right greater trochanter.  Negative for pain with hip internal and external rotation.  Normal hip flexion and extension with walking.  No bruise on the skin, no laceration Right knee shows normal flexion and extension, seems stable  [facility] reading (PRIMARY) by  Dr. Lorelei Pont. Right hip: negative, except for degenerative change Right femur: negative for fracture  ADDENDUM: Correction  FINDINGS: Four views of the right femur submitted. No acute fracture or subluxation. Mild degenerative changes right hip joint. Visualized knee joint is unremarkable. Mild narrowing of patellofemoral joint space. No joint effusion.  Impression: No acute fracture or subluxation  RIGHT HIP - COMPLETE 2+ VIEW  COMPARISON: None.  FINDINGS: Three views of the right hip submitted. No acute fracture or subluxation. There is spurring of greater femoral trochanter. Mild narrowing of superior joint space. Degenerative changes visualized lower lumbar spine.  IMPRESSION: No acute fracture or subluxation. Degenerative changes as described above.  Assessment and Plan: Hip pain, right - Plan: DG Femur Right, DG Hip Complete Right  Recent fall onto right hip.  No apparent fracture.  At this time she is walking and getting around well.  No NSAIDs due to history of intracranial bleed in the past, but she can use tylenol prn.  Encouraged her to go slow and use her walker for balance and  safety.  At this time she declines further evaluation of her knee which may have "given way" triggering her fall.  Agrees to let me know if this continues to be a problem for her.    See patient instructions for more details.     Signed Lamar Blinks, MD

## 2013-11-29 NOTE — Patient Instructions (Signed)
I will let you know if the radiologist says anything different but I do not see any fracture on your x-rays.  If you develop any worsening of your respiratory sx please let me know.

## 2013-12-03 ENCOUNTER — Telehealth: Payer: Self-pay

## 2013-12-03 NOTE — Telephone Encounter (Signed)
Patient called stated she is in pain when sitting or moving. She is not able to get up. Patient request to speak with  Dr. Lorelei Pont. [phone]

## 2013-12-03 NOTE — Telephone Encounter (Signed)
To Dr Lorelei Pont, please advise, what is next step in plan?

## 2013-12-03 NOTE — Telephone Encounter (Signed)
Called by Tana Coast- CMA.  Pt reports that she can actually walk and is walking around with her walker.  She does have pain, but does not desire anything stronger for pain.  She "knows it's bruised and that's going to take awhile" to get better.

## 2013-12-07 NOTE — Telephone Encounter (Signed)
Called to check on her- she still has pain.  Asked her to come back for a recheck in the next few days- she agreed

## 2014-02-07 ENCOUNTER — Telehealth: Payer: Self-pay | Admitting: *Deleted

## 2014-02-07 ENCOUNTER — Ambulatory Visit: Payer: Medicare Other | Admitting: Cardiology

## 2014-02-14 ENCOUNTER — Encounter: Payer: Self-pay | Admitting: Cardiology

## 2014-02-14 ENCOUNTER — Ambulatory Visit (INDEPENDENT_AMBULATORY_CARE_PROVIDER_SITE_OTHER): Payer: Medicare Other | Admitting: Cardiology

## 2014-02-14 VITALS — BP 132/60 | HR 67 | Ht 67.5 in | Wt 152.2 lb

## 2014-02-14 DIAGNOSIS — I4891 Unspecified atrial fibrillation: Secondary | ICD-10-CM

## 2014-02-14 DIAGNOSIS — R079 Chest pain, unspecified: Secondary | ICD-10-CM

## 2014-02-14 DIAGNOSIS — I480 Paroxysmal atrial fibrillation: Secondary | ICD-10-CM

## 2014-02-14 DIAGNOSIS — F17200 Nicotine dependence, unspecified, uncomplicated: Secondary | ICD-10-CM

## 2014-02-14 DIAGNOSIS — Z720 Tobacco use: Secondary | ICD-10-CM

## 2014-02-14 DIAGNOSIS — E785 Hyperlipidemia, unspecified: Secondary | ICD-10-CM

## 2014-02-14 MED ORDER — DOXYCYCLINE HYCLATE 100 MG PO CAPS: 100.0000 mg | ORAL_CAPSULE | Freq: Two times a day (BID) | ORAL | Status: DC

## 2014-02-14 NOTE — Assessment & Plan Note (Signed)
05/2013 followed by PCP TG 118, tot. Chol 105, HDL 39, LDL 42 on lipitor

## 2014-02-14 NOTE — Assessment & Plan Note (Signed)
None recently, though in mornings she may have a few fast heart beats.  No anticoagulation secondary to subdural bleed.

## 2014-02-14 NOTE — Patient Instructions (Addendum)
decrease caffeine and tobacco if you can.    Call if prolonged tachycardia. Or increased chest pain.    Follow up with Dr. Gwenlyn Found in November.

## 2014-02-14 NOTE — Assessment & Plan Note (Signed)
Continues to smoke 1ppd. Have asked her to decrease if possible.

## 2014-02-14 NOTE — Assessment & Plan Note (Signed)
occ episodes of sharp fleeting chest pain.  Recent negative nuc study.

## 2014-02-14 NOTE — Progress Notes (Signed)
02/14/2014   PCP: Merrilee Seashore, MD   Chief Complaint  Patient presents with  . Follow-up    6 months follow-up    Primary Cardiologist: Dr. Gwenlyn Found  HPI:  80 year old divorced Caucasian female mother of 3, referred to Dr. Gwenlyn Found by Dr. Ashby Dawes. Her cardiac risk factor profile is positive for 70 pack year history of tobacco abuse, hypertension, hyperlipidemia and family history. Her brother had bypass surgery and her father died sudden cardiac death. She has never had a heart attack or stroke. She complaines of dyspnea on exertion and history of daily chest pain. Because of her symptoms Dr. Gwenlyn Found performed a 2-D echocardiogram which was entirely normal and a Myoview stress test which likewise was normal. He reassured Ms. Footman that her chest pain is most likely noncardiac.   Today she is here for 6 month follow up. She has hx of PAF and while on coumadin had subdural hematoma.  No longer on anticoagulation and maintaining SR.  She also has COPD and really has no desire to stop smoking.  We did discuss importance of at least decreasing tobacco and caffeine.  She continues with brief sharp shooting fleeting chest pains, they improve with belching.      No Known Allergies  Current Outpatient Prescriptions  Medication Sig Dispense Refill  . albuterol (PROVENTIL HFA;VENTOLIN HFA) 108 (90 BASE) MCG/ACT inhaler Inhale 2 puffs into the lungs every 6 (six) hours as needed.      Marland Kitchen albuterol (PROVENTIL) (2.5 MG/3ML) 0.083% nebulizer solution Take 3 mLs (2.5 mg total) by nebulization every 4 (four) hours as needed for wheezing.  150 vial  1  . aspirin 325 MG tablet Take 325 mg by mouth daily.      Marland Kitchen atorvastatin (LIPITOR) 40 MG tablet Every other day      . captopril (CAPOTEN) 50 MG tablet Take 50 mg by mouth 2 (two) times daily.       . diazepam (VALIUM) 5 MG tablet Take 1 tablet by mouth as needed.      . fish oil-omega-3 fatty acids 1000 MG capsule Take 2 g by mouth  daily.      Marland Kitchen ipratropium (ATROVENT) 0.02 % nebulizer solution Take 1.25 mLs (250 mcg total) by nebulization 4 (four) times daily.  75 mL  12  . metoprolol succinate (TOPROL-XL) 50 MG 24 hr tablet Take 50 mg by mouth daily. 25 mg at night..      . Multiple Vitamin (MULTIVITAMIN WITH MINERALS) TABS tablet Take 1 tablet by mouth daily.      . verapamil (COVERA HS) 240 MG (CO) 24 hr tablet Take 240 mg by mouth at bedtime.       No current facility-administered medications for this visit.    Past Medical History  Diagnosis Date  . Paroxysmal atrial fibrillation   . Left bundle branch block   . Hypertension   . Hyperlipidemia   . Chest pain   . Tobacco abuse   . COPD (chronic obstructive pulmonary disease)   . Personal history of subdural hematoma   . Cataract     Past Surgical History  Procedure Laterality Date  . Eye surgery    . Abdominal hysterectomy    . Appendectomy    . Brain surgery      AVW:UJWJXBJ:YN colds or fevers, no weight changes Skin:no rashes or ulcers HEENT:no blurred vision, no congestion CV:see HPI PUL:see HPI GI:no diarrhea constipation or melena, no indigestion GU:no hematuria,  no dysuria MS:no joint pain, no claudication, did have recent fall and bruised her hip, she is using her cane more. Neuro:no syncope, no lightheadedness Endo:no diabetes, no thyroid disease  PHYSICAL EXAM BP 132/60  Pulse 67  Ht 5' 7.5" (1.715 m)  Wt 152 lb 3.2 oz (69.037 kg)  BMI 23.47 kg/m2 General:Pleasant affect, NAD Skin:Warm and dry, brisk capillary refill HEENT:normocephalic, sclera clear, mucus membranes moist Neck:supple, no JVD, no bruits, no adenopathy  Heart:S1S2 RRR without murmur, gallup, rub or click Lungs:clear without rales, rhonchi, + occ wheezes SKA:JGOT, non tender, + BS, do not palpate liver spleen or masses Ext:no lower ext edema, 2+ pedal pulses, 2+ radial pulses Neuro:alert and oriented, MAE, follows commands, + facial symmetry  EKG:SR LBBB no  acute changes form previous except HR a little faster and no first degree block, though borderline.  ASSESSMENT AND PLAN Paroxysmal atrial fibrillation None recently, though in mornings she may have a few fast heart beats.  No anticoagulation secondary to subdural bleed.  Chest pain occ episodes of sharp fleeting chest pain.  Recent negative nuc study.  Tobacco abuse Continues to smoke 1ppd. Have asked her to decrease if possible.   Hyperlipidemia 05/2013 followed by PCP TG 118, tot. Chol 105, HDL 39, LDL 42 on lipitor

## 2014-02-25 NOTE — Telephone Encounter (Signed)
Encounter Closed---5/8 TP 

## 2014-07-15 ENCOUNTER — Ambulatory Visit (INDEPENDENT_AMBULATORY_CARE_PROVIDER_SITE_OTHER): Payer: Medicare Other | Admitting: Emergency Medicine

## 2014-07-15 ENCOUNTER — Ambulatory Visit (INDEPENDENT_AMBULATORY_CARE_PROVIDER_SITE_OTHER): Payer: Medicare Other

## 2014-07-15 VITALS — BP 132/58 | HR 64 | Temp 97.4°F | Resp 18 | Ht 67.5 in | Wt 152.8 lb

## 2014-07-15 DIAGNOSIS — R05 Cough: Secondary | ICD-10-CM

## 2014-07-15 DIAGNOSIS — J441 Chronic obstructive pulmonary disease with (acute) exacerbation: Secondary | ICD-10-CM

## 2014-07-15 DIAGNOSIS — R059 Cough, unspecified: Secondary | ICD-10-CM

## 2014-07-15 MED ORDER — CLARITHROMYCIN ER 500 MG PO TB24: 1000.0000 mg | ORAL_TABLET | Freq: Every day | ORAL | Status: DC

## 2014-07-15 NOTE — Progress Notes (Signed)
[facility] [address] [place] [address] [phone]  Date:  07/15/2014   Name:  [name]   DOB:  02-04-1924   MRN:  [id number]  PCP:  Merrilee Seashore, MD    Chief Complaint: Fluttering Heart   History of Present Illness:  [name] is a 80 y.o. very pleasant female patient who presents with the following:  Patient treated with an antibiotic last week by [facility] and she had an allergic reaction and stopped it.   Has a persistent cough and wheezing. Cough is largely non productive.  Some exertional shortness of breath No nausea, vomiting, stool change.  No chest pain.  Still has abnormal rhythm No improvement with over the counter medications or other home remedies.  Denies other complaint or health concern today.   Patient Active Problem List   Diagnosis Date Noted  . Chest pain 07/30/2013  . Paroxysmal atrial fibrillation 07/30/2013  . Tobacco abuse 07/30/2013  . Hyperlipidemia 07/30/2013  . Essential hypertension 07/30/2013  . Family history of heart disease 07/30/2013  . Dyspnea on exertion 07/30/2013  . COPD (chronic obstructive pulmonary disease) 04/06/2012    Past Medical History  Diagnosis Date  . Paroxysmal atrial fibrillation   . Left bundle branch block   . Hypertension   . Hyperlipidemia   . Chest pain   . Tobacco abuse   . COPD (chronic obstructive pulmonary disease)   . Personal history of subdural hematoma   . Cataract     Past Surgical History  Procedure Laterality Date  . Eye surgery    . Abdominal hysterectomy    . Appendectomy    . Brain surgery      History  Substance Use Topics  . Smoking status: Current Every Day Smoker    Types: Cigarettes    Start date: 07/30/1945  . Smokeless tobacco: Not on file  . Alcohol Use: No    Family History  Problem Relation Age of Onset  . Diabetes Mother   . Heart disease Mother   . Heart disease Father     Allergies  Allergen Reactions  . Cefuroxime  Nausea And Vomiting    Medication list has been reviewed and updated.  Current Outpatient Prescriptions on File Prior to Visit  Medication Sig Dispense Refill  . albuterol (PROVENTIL HFA;VENTOLIN HFA) 108 (90 BASE) MCG/ACT inhaler Inhale 2 puffs into the lungs every 6 (six) hours as needed.      Marland Kitchen aspirin 325 MG tablet Take 325 mg by mouth daily.      Marland Kitchen atorvastatin (LIPITOR) 40 MG tablet Every other day      . captopril (CAPOTEN) 50 MG tablet Take 50 mg by mouth 2 (two) times daily.       . diazepam (VALIUM) 5 MG tablet Take 1 tablet by mouth as needed.      . fish oil-omega-3 fatty acids 1000 MG capsule Take 2 g by mouth daily.      . metoprolol succinate (TOPROL-XL) 50 MG 24 hr tablet Take 50 mg by mouth daily. 25 mg at night..      . Multiple Vitamin (MULTIVITAMIN WITH MINERALS) TABS tablet Take 1 tablet by mouth daily.      . verapamil (COVERA HS) 240 MG (CO) 24 hr tablet Take 240 mg by mouth at bedtime.      Marland Kitchen albuterol (PROVENTIL) (2.5 MG/3ML) 0.083% nebulizer solution Take 3 mLs (2.5 mg total) by nebulization every 4 (four) hours as needed  for wheezing.  150 vial  1  . ipratropium (ATROVENT) 0.02 % nebulizer solution Take 1.25 mLs (250 mcg total) by nebulization 4 (four) times daily.  75 mL  12   No current facility-administered medications on file prior to visit.    Review of Systems:  As per HPI, otherwise negative.    Physical Examination: Filed Vitals:   07/15/14 1137  BP: 132/58  Pulse: 64  Temp: 97.4 F (36.3 C)  Resp: 18   Filed Vitals:   07/15/14 1137  Height: 5' 7.5" (1.715 m)  Weight: 152 lb 12.8 oz (69.31 kg)   Body mass index is 23.57 kg/(m^2). Ideal Body Weight: Weight in (lb) to have BMI = 25: 161.7  GEN: WDWN, NAD, Non-toxic, A & O x 3 HEENT: Atraumatic, Normocephalic. Neck supple. No masses, No LAD. Ears and Nose: No external deformity. CV: RRR, No M/G/R. No JVD. No thrill. No extra heart sounds. PULM: CTA B, no wheezes, crackles, rhonchi. No  retractions. No resp. distress. No accessory muscle use. ABD: S, NT, ND, +BS. No rebound. No HSM. EXTR: No c/c/e NEURO Normal gait.  PSYCH: Normally interactive. Conversant. Not depressed or anxious appearing.  Calm demeanor.    Assessment and Plan: Exacerbation COPD/bronchitis biaxin  Signed,  Ellison Carwin, MD   [facility] reading (PRIMARY) by  Dr. Ouida Sills . COPD.

## 2014-07-15 NOTE — Patient Instructions (Signed)

## 2014-07-29 ENCOUNTER — Other Ambulatory Visit: Payer: Self-pay | Admitting: Emergency Medicine

## 2014-07-29 DIAGNOSIS — R918 Other nonspecific abnormal finding of lung field: Secondary | ICD-10-CM

## 2014-09-05 ENCOUNTER — Ambulatory Visit (INDEPENDENT_AMBULATORY_CARE_PROVIDER_SITE_OTHER): Payer: Medicare Other | Admitting: Ophthalmology

## 2014-10-26 ENCOUNTER — Encounter: Payer: Self-pay | Admitting: Cardiovascular Disease

## 2014-10-26 ENCOUNTER — Ambulatory Visit (INDEPENDENT_AMBULATORY_CARE_PROVIDER_SITE_OTHER): Payer: 59 | Admitting: Cardiovascular Disease

## 2014-10-26 VITALS — BP 140/60 | HR 76 | Ht 67.5 in | Wt 154.8 lb

## 2014-10-26 DIAGNOSIS — E785 Hyperlipidemia, unspecified: Secondary | ICD-10-CM

## 2014-10-26 DIAGNOSIS — R079 Chest pain, unspecified: Secondary | ICD-10-CM | POA: Diagnosis not present

## 2014-10-26 DIAGNOSIS — I480 Paroxysmal atrial fibrillation: Secondary | ICD-10-CM | POA: Diagnosis not present

## 2014-10-26 DIAGNOSIS — I10 Essential (primary) hypertension: Secondary | ICD-10-CM

## 2014-10-26 NOTE — Patient Instructions (Signed)
Your physician wants you to follow-up in 1 year with Dr. Berry. You will receive a reminder letter in the mail 2 months in advance. If you do not receive a letter, please call our office to schedule the follow-up appointment.  

## 2014-10-26 NOTE — Assessment & Plan Note (Signed)
She continues to have infrequent atypical chest pain and has had a negative Myoview in the past

## 2014-10-26 NOTE — Assessment & Plan Note (Signed)
History of hyperlipidemia or atorvastatin 40 mg a day followed by her PCP

## 2014-10-26 NOTE — Assessment & Plan Note (Signed)
History of hypertension with blood pressure measured at 140/60. She is on captopril, metoprolol and verapamil. Continue current meds at current dosing

## 2014-10-26 NOTE — Progress Notes (Signed)
10/26/2014 [name]   10/03/1924  [id number]  Primary Physician Merrilee Seashore, MD Primary Cardiologist: Lorretta Harp MD Renae Gloss   HPI:  [name] is a 81 year old divorced Caucasian female mother of 23, grandmother great-grandchildren referred to me by Dr. Ashby Dawes. I last saw her 08/18/13. She has seen Cecilie Kicks back in April of last year. Her cardiac risk factor profile is positive for 70 pack year history of tobacco abuse, hypertension, hyperlipidemia and family history. Her brother had bypass surgery and her father died sudden cardiac death. She has never had a heart attack or stroke. She complained of dyspnea on exertion and a 3 month history of daily chest pain prior to her initial presentation.. Because of her symptoms I performed a 2-D echocardiogram which was entirely normal and a Myoview stress test which likewise was normal. I reassured [name] that her chest pain is most likely noncardiac. Since I saw her a year ago her symptoms have not changed in frequency or severity.   Current Outpatient Prescriptions  Medication Sig Dispense Refill  . albuterol (PROVENTIL HFA;VENTOLIN HFA) 108 (90 BASE) MCG/ACT inhaler Inhale 2 puffs into the lungs every 6 (six) hours as needed.    Marland Kitchen aspirin 325 MG tablet Take 325 mg by mouth daily.    Marland Kitchen atorvastatin (LIPITOR) 40 MG tablet Every other day    . captopril (CAPOTEN) 50 MG tablet Take 50 mg by mouth 2 (two) times daily.     . cefUROXime (CEFTIN) 500 MG tablet Take 500 mg by mouth 2 (two) times daily with a meal.    . clarithromycin (BIAXIN XL) 500 MG 24 hr tablet Take 2 tablets (1,000 mg total) by mouth daily. 20 tablet 0  . diazepam (VALIUM) 5 MG tablet Take 1 tablet by mouth as needed.    . fish oil-omega-3 fatty acids 1000 MG capsule Take 2 g by mouth daily.    . metoprolol succinate (TOPROL-XL) 50 MG 24 hr tablet Take 50 mg by mouth daily. 25 mg at night..    . Multiple Vitamin (MULTIVITAMIN WITH  MINERALS) TABS tablet Take 1 tablet by mouth daily.    . OXYGEN Inhale 2 L into the lungs at bedtime.    . verapamil (COVERA HS) 240 MG (CO) 24 hr tablet Take 240 mg by mouth at bedtime.    Marland Kitchen albuterol (PROVENTIL) (2.5 MG/3ML) 0.083% nebulizer solution Take 3 mLs (2.5 mg total) by nebulization every 4 (four) hours as needed for wheezing. 150 vial 1  . ipratropium (ATROVENT) 0.02 % nebulizer solution Take 1.25 mLs (250 mcg total) by nebulization 4 (four) times daily. 75 mL 12   No current facility-administered medications for this visit.    Allergies  Allergen Reactions  . Cefuroxime Nausea And Vomiting    History   Social History  . Marital Status: Divorced    Spouse Name: N/A    Number of Children: N/A  . Years of Education: N/A   Occupational History  . Not on file.   Social History Main Topics  . Smoking status: Current Every Day Smoker    Types: Cigarettes    Start date: 07/30/1945  . Smokeless tobacco: Not on file  . Alcohol Use: No  . Drug Use: No  . Sexual Activity: Not on file   Other Topics Concern  . Not on file   Social History Narrative     Review of Systems: General: negative for chills, fever, night sweats or weight changes.  Cardiovascular: negative for chest pain, dyspnea on exertion, edema, orthopnea, palpitations, paroxysmal nocturnal dyspnea or shortness of breath Dermatological: negative for rash Respiratory: negative for cough or wheezing Urologic: negative for hematuria Abdominal: negative for nausea, vomiting, diarrhea, bright red blood per rectum, melena, or hematemesis Neurologic: negative for visual changes, syncope, or dizziness All other systems reviewed and are otherwise negative except as noted above.    Blood pressure 140/60, pulse 76, height 5' 7.5" (1.715 m), weight 154 lb 12.8 oz (70.217 kg).  General appearance: alert and no distress Neck: no adenopathy, no carotid bruit, no JVD, supple, symmetrical, trachea midline and thyroid  not enlarged, symmetric, no tenderness/mass/nodules Lungs: clear to auscultation bilaterally Heart: regular rate and rhythm, S1, S2 normal, no murmur, click, rub or gallop Extremities: extremities normal, atraumatic, no cyanosis or edema  EKG normal sinus rhythm at 76 with a nonspecific IVCD. I personally reviewed this EKG  ASSESSMENT AND PLAN:   Paroxysmal atrial fibrillation History of PAF maintaining sinus rhythm  Hyperlipidemia History of hyperlipidemia or atorvastatin 40 mg a day followed by her PCP  Essential hypertension History of hypertension with blood pressure measured at 140/60. She is on captopril, metoprolol and verapamil. Continue current meds at current dosing  Chest pain She continues to have infrequent atypical chest pain and has had a negative Myoview in the past      Lorretta Harp MD [facility], [facility] 10/26/2014 10:56 AM

## 2014-10-26 NOTE — Assessment & Plan Note (Signed)
History of PAF maintaining sinus rhythm. 

## 2014-10-31 DIAGNOSIS — J449 Chronic obstructive pulmonary disease, unspecified: Secondary | ICD-10-CM | POA: Diagnosis not present

## 2014-12-01 DIAGNOSIS — J449 Chronic obstructive pulmonary disease, unspecified: Secondary | ICD-10-CM | POA: Diagnosis not present

## 2014-12-30 DIAGNOSIS — J449 Chronic obstructive pulmonary disease, unspecified: Secondary | ICD-10-CM | POA: Diagnosis not present

## 2015-01-30 DIAGNOSIS — J449 Chronic obstructive pulmonary disease, unspecified: Secondary | ICD-10-CM | POA: Diagnosis not present

## 2015-02-21 DIAGNOSIS — I482 Chronic atrial fibrillation: Secondary | ICD-10-CM | POA: Diagnosis not present

## 2015-02-21 DIAGNOSIS — E782 Mixed hyperlipidemia: Secondary | ICD-10-CM | POA: Diagnosis not present

## 2015-03-01 DIAGNOSIS — J449 Chronic obstructive pulmonary disease, unspecified: Secondary | ICD-10-CM | POA: Diagnosis not present

## 2015-03-01 DIAGNOSIS — M159 Polyosteoarthritis, unspecified: Secondary | ICD-10-CM | POA: Diagnosis not present

## 2015-03-01 DIAGNOSIS — I10 Essential (primary) hypertension: Secondary | ICD-10-CM | POA: Diagnosis not present

## 2015-03-01 DIAGNOSIS — G4733 Obstructive sleep apnea (adult) (pediatric): Secondary | ICD-10-CM | POA: Diagnosis not present

## 2015-03-01 DIAGNOSIS — I482 Chronic atrial fibrillation: Secondary | ICD-10-CM | POA: Diagnosis not present

## 2015-04-01 DIAGNOSIS — J449 Chronic obstructive pulmonary disease, unspecified: Secondary | ICD-10-CM | POA: Diagnosis not present

## 2015-05-01 DIAGNOSIS — J449 Chronic obstructive pulmonary disease, unspecified: Secondary | ICD-10-CM | POA: Diagnosis not present

## 2015-07-02 DIAGNOSIS — J449 Chronic obstructive pulmonary disease, unspecified: Secondary | ICD-10-CM | POA: Diagnosis not present

## 2015-07-13 ENCOUNTER — Ambulatory Visit (INDEPENDENT_AMBULATORY_CARE_PROVIDER_SITE_OTHER): Payer: Medicare Other

## 2015-07-13 ENCOUNTER — Ambulatory Visit (INDEPENDENT_AMBULATORY_CARE_PROVIDER_SITE_OTHER): Payer: Medicare Other | Admitting: Family Medicine

## 2015-07-13 VITALS — BP 110/60 | HR 68 | Temp 98.0°F | Resp 16 | Ht 67.5 in | Wt 152.0 lb

## 2015-07-13 DIAGNOSIS — M545 Low back pain, unspecified: Secondary | ICD-10-CM

## 2015-07-13 DIAGNOSIS — N3945 Continuous leakage: Secondary | ICD-10-CM | POA: Diagnosis not present

## 2015-07-13 DIAGNOSIS — G252 Other specified forms of tremor: Secondary | ICD-10-CM

## 2015-07-13 DIAGNOSIS — G8929 Other chronic pain: Secondary | ICD-10-CM

## 2015-07-13 LAB — POC MICROSCOPIC URINALYSIS (UMFC): Mucus: ABSENT

## 2015-07-13 LAB — POCT URINALYSIS DIP (MANUAL ENTRY)
Bilirubin, UA: NEGATIVE
Blood, UA: NEGATIVE
Glucose, UA: NEGATIVE
Nitrite, UA: NEGATIVE
Protein Ur, POC: NEGATIVE
Spec Grav, UA: 1.02
Urobilinogen, UA: 1
pH, UA: 6.5

## 2015-07-13 MED ORDER — OXYBUTYNIN CHLORIDE ER 5 MG PO TB24: 5.0000 mg | ORAL_TABLET | Freq: Every day | ORAL | Status: DC

## 2015-07-13 MED ORDER — PREDNISONE 20 MG PO TABS: ORAL_TABLET | ORAL | Status: DC

## 2015-07-13 NOTE — Patient Instructions (Signed)
I have written medicine for the bladder leakage and the back pain. He has significant arthritis in your back and I am groin to make a referral to both a neurologist as well as an orthopedic doctor to see if there are other measures we can take the seizure chronic back pain.    I think you're using heat is an excellent idea and the ThermaCare as an excellent way to help relieve some of the pain.   For relieving the inflammation in your back, I have ordered prednisone for 5 days. This should buy some time while week make plans for the referrals to the specialists.   I would recommend taking Senna-S to help with the chronic constipation.

## 2015-07-13 NOTE — Progress Notes (Addendum)
This chart was scribed for Robyn Haber, MD by Moises Blood, medical scribe at [facility].The patient was seen in exam room 1 and the patient's care was started at 11:02 AM.  Patient ID: PAETYN PIETRZAK MRN: [id number], DOB: 1924-05-13, 81 y.o. Date of Encounter: 07/13/2015  Primary Physician: Merrilee Seashore, MD  Chief Complaint:  Chief Complaint  Patient presents with  . Back Pain    lower back/ x 2 years pain not getting better  . shaking in right arm    x year    HPI:  RUSSIE GULLEDGE is a 81 y.o. female who is brought in by her son presents to [facility] complaining of lower back pain on both sides for a couple of years now. She applies heating pads occasionally and it would give mild relief. It hurts more when she's walking. She has numbness in her feet at night. She has to wear something at night to prevent urinary leak.  She also notes having constipation. She denies soreness on her back to palpation.   She has two doctors: Dr. Ashby Dawes for PCP and Dr. Gwenlyn Found for Cardiology.  She reports h/o neuropathy. She has not had any imaging done.  Her mother had parkinson's.   Past Medical History  Diagnosis Date  . Paroxysmal atrial fibrillation   . Left bundle branch block   . Hypertension   . Hyperlipidemia   . Chest pain   . Tobacco abuse   . COPD (chronic obstructive pulmonary disease)   . Personal history of subdural hematoma   . Cataract      Home Meds: Prior to Admission medications   Medication Sig Start Date End Date Taking? Authorizing Provider  albuterol (PROVENTIL HFA;VENTOLIN HFA) 108 (90 BASE) MCG/ACT inhaler Inhale 2 puffs into the lungs every 6 (six) hours as needed.   Yes Historical Provider, MD  aspirin 325 MG tablet Take 325 mg by mouth daily.   Yes Historical Provider, MD  atorvastatin (LIPITOR) 40 MG tablet Every other day   Yes Historical Provider, MD  captopril (CAPOTEN) 50 MG tablet Take 50 mg by mouth 2  (two) times daily.    Yes Historical Provider, MD  cefUROXime (CEFTIN) 500 MG tablet Take 500 mg by mouth 2 (two) times daily with a meal.   Yes Historical Provider, MD  clarithromycin (BIAXIN XL) 500 MG 24 hr tablet Take 2 tablets (1,000 mg total) by mouth daily. 07/15/14  Yes Roselee Culver, MD  diazepam (VALIUM) 5 MG tablet Take 1 tablet by mouth as needed. 12/12/13  Yes Historical Provider, MD  fish oil-omega-3 fatty acids 1000 MG capsule Take 2 g by mouth daily.   Yes Historical Provider, MD  metoprolol succinate (TOPROL-XL) 50 MG 24 hr tablet Take 50 mg by mouth daily. 25 mg at night..   Yes Historical Provider, MD  Multiple Vitamin (MULTIVITAMIN WITH MINERALS) TABS tablet Take 1 tablet by mouth daily.   Yes Historical Provider, MD  OXYGEN Inhale 2 L into the lungs at bedtime.   Yes Historical Provider, MD  verapamil (COVERA HS) 240 MG (CO) 24 hr tablet Take 240 mg by mouth at bedtime.   Yes Historical Provider, MD  albuterol (PROVENTIL) (2.5 MG/3ML) 0.083% nebulizer solution Take 3 mLs (2.5 mg total) by nebulization every 4 (four) hours as needed for wheezing. 04/06/12 02/14/14  Roselee Culver, MD  ipratropium (ATROVENT) 0.02 % nebulizer solution Take 1.25 mLs (250 mcg total) by nebulization 4 (four)  times daily. 04/06/12 02/14/14  Roselee Culver, MD    Allergies:  Allergies  Allergen Reactions  . Cefuroxime Nausea And Vomiting    Social History   Social History  . Marital Status: Divorced    Spouse Name: N/A  . Number of Children: N/A  . Years of Education: N/A   Occupational History  . Not on file.   Social History Main Topics  . Smoking status: Current Every Day Smoker    Types: Cigarettes    Start date: 07/30/1945  . Smokeless tobacco: Not on file  . Alcohol Use: No  . Drug Use: No  . Sexual Activity: Not on file   Other Topics Concern  . Not on file   Social History Narrative     Review of Systems: Constitutional: negative for chills, fever, night  sweats, weight changes, or fatigue  HEENT: negative for vision changes, hearing loss, congestion, rhinorrhea, ST, epistaxis, or sinus pressure Cardiovascular: negative for chest pain or palpitations Respiratory: negative for hemoptysis, wheezing, shortness of breath, or cough Abdominal: negative for abdominal pain, nausea, vomiting, diarrhea, or constipation Dermatological: negative for rash Neurologic: negative for headache, dizziness, or syncope; positive for numbness at night (feet) Musc: positive for lower back pain All other systems reviewed and are otherwise negative with the exception to those above and in the HPI.  Physical Exam: Blood pressure 110/60, pulse 68, temperature 98 F (36.7 C), temperature source Oral, resp. rate 16, height 5' 7.5" (1.715 m), weight 152 lb (68.947 kg), SpO2 90 %., Body mass index is 23.44 kg/(m^2). General: Well developed, well nourished, in no acute distress. Head: Normocephalic, atraumatic, eyes without discharge, sclera non-icteric, nares are without discharge. Bilateral auditory canals clear, TM's are without perforation, pearly grey and translucent with reflective cone of light bilaterally. Oral cavity moist, posterior pharynx without exudate, erythema, peritonsillar abscess, or post nasal drip.  Neck: Supple. No thyromegaly. Full ROM. No lymphadenopathy. Lungs: Clear bilaterally to auscultation without wheezes, rales, or rhonchi. Breathing is unlabored. Heart: RRR with S1 S2. No murmurs, rubs, or gallops appreciated. Abdomen: Soft, non-tender, non-distended with normoactive bowel sounds. No hepatomegaly. No rebound/guarding. No obvious abdominal masses. Msk:   Patient has right thenar wasting. Extremities/Skin: Warm and dry. No clubbing or cyanosis. No edema. No rashes or suspicious lesions. Neuro: Alert and oriented X 3. Moves all extremities spontaneously. Gait is normal. CNII-XII grossly in tact. She has an action tremor in the right upper  extremity Psych:  Responds to questions appropriately with a normal affect.   Labs: Results for orders placed or performed in visit on 07/13/15  POCT urinalysis dipstick  Result Value Ref Range   Color, UA yellow yellow   Clarity, UA clear clear   Glucose, UA negative negative   Bilirubin, UA negative negative   Ketones, POC UA small (15) (A) negative   Spec Grav, UA 1.020    Blood, UA negative negative   pH, UA 6.5    Protein Ur, POC negative negative   Urobilinogen, UA 1.0    Nitrite, UA Negative Negative   Leukocytes, UA Trace (A) Negative  POCT Microscopic Urinalysis ([facility])  Result Value Ref Range   WBC,UR,HPF,POC Few (A) None WBC/hpf   RBC,UR,HPF,POC Few (A) None RBC/hpf   Bacteria Many (A) None   Mucus Absent Absent   Epithelial Cells, UR Per Microscopy Few (A) None cells/hpf   [facility] reading (PRIMARY) by  Dr. Joseph Art:  X-rays of the lumbar spine show diffuse disc space narrowing in the  lumbar spaces with sclerotic changes L4-L5 and S1. Patient also has mild scoliosis with upper lumbar convexity to the right. She has diffuse calcifications all along the aorta. There is increased stool burden as well..   ASSESSMENT AND PLAN:  81 y.o. year old female with chronic low back pain. This appears to be arthritic in nature. I think at this time, the best approach is to consider injections into the back to decrease the inflammation. Also, neurology referral would be helpful. This chart was scribed in my presence and reviewed by me personally.    ICD-9-CM ICD-10-CM   1. Chronic low back pain 724.2 M54.5 DG Lumbar Spine 2-3 Views   338.29 G89.29 Ambulatory referral to Orthopedic Surgery     Ambulatory referral to Neurology     predniSONE (DELTASONE) 20 MG tablet  2. Continuous leakage of urine 788.37 N39.45 POCT urinalysis dipstick     POCT Microscopic Urinalysis ([facility])     Urine culture     DG Lumbar Spine 2-3 Views     Ambulatory referral to Neurology     oxybutynin (DITROPAN  XL) 5 MG 24 hr tablet  3. Action tremor 333.1 G25.2 Ambulatory referral to Neurology     Signed, Robyn Haber, MD    Signed, Robyn Haber, MD 07/13/2015 12:29 PM

## 2015-07-16 LAB — URINE CULTURE: Colony Count: >=100000

## 2015-07-27 ENCOUNTER — Emergency Department (HOSPITAL_COMMUNITY)
Admission: EM | Admit: 2015-07-27 | Discharge: 2015-07-27 | Disposition: A | Discharge status: Home / Self Care | Payer: Medicare Other | Attending: Emergency Medicine | Admitting: Emergency Medicine

## 2015-07-27 ENCOUNTER — Emergency Department (HOSPITAL_COMMUNITY): Payer: Medicare Other

## 2015-07-27 ENCOUNTER — Encounter (HOSPITAL_COMMUNITY): Payer: Self-pay | Admitting: Emergency Medicine

## 2015-07-27 DIAGNOSIS — I10 Essential (primary) hypertension: Secondary | ICD-10-CM | POA: Diagnosis not present

## 2015-07-27 DIAGNOSIS — J449 Chronic obstructive pulmonary disease, unspecified: Secondary | ICD-10-CM | POA: Diagnosis not present

## 2015-07-27 DIAGNOSIS — E785 Hyperlipidemia, unspecified: Secondary | ICD-10-CM | POA: Diagnosis not present

## 2015-07-27 DIAGNOSIS — Z87828 Personal history of other (healed) physical injury and trauma: Secondary | ICD-10-CM | POA: Diagnosis not present

## 2015-07-27 DIAGNOSIS — Z720 Tobacco use: Secondary | ICD-10-CM | POA: Insufficient documentation

## 2015-07-27 DIAGNOSIS — Z8669 Personal history of other diseases of the nervous system and sense organs: Secondary | ICD-10-CM | POA: Insufficient documentation

## 2015-07-27 DIAGNOSIS — R42 Dizziness and giddiness: Secondary | ICD-10-CM | POA: Diagnosis not present

## 2015-07-27 DIAGNOSIS — I480 Paroxysmal atrial fibrillation: Secondary | ICD-10-CM | POA: Insufficient documentation

## 2015-07-27 DIAGNOSIS — Z79899 Other long term (current) drug therapy: Secondary | ICD-10-CM | POA: Diagnosis not present

## 2015-07-27 DIAGNOSIS — Z7982 Long term (current) use of aspirin: Secondary | ICD-10-CM | POA: Diagnosis not present

## 2015-07-27 LAB — URINE MICROSCOPIC-ADD ON

## 2015-07-27 LAB — CBC
HCT: 33.4 % — ABNORMAL LOW (ref 36.0–46.0)
Hemoglobin: 9.4 g/dL — ABNORMAL LOW (ref 12.0–15.0)
MCH: 21.8 pg — AB (ref 26.0–34.0)
MCHC: 28.1 g/dL — AB (ref 30.0–36.0)
MCV: 77.5 fL — ABNORMAL LOW (ref 78.0–100.0)
PLATELETS: 392 10*3/uL (ref 150–400)
RBC: 4.31 MIL/uL (ref 3.87–5.11)
RDW: 17.8 % — AB (ref 11.5–15.5)
WBC: 11.7 10*3/uL — ABNORMAL HIGH (ref 4.0–10.5)

## 2015-07-27 LAB — URINALYSIS, ROUTINE W REFLEX MICROSCOPIC
BILIRUBIN URINE: NEGATIVE
GLUCOSE, UA: NEGATIVE mg/dL
HGB URINE DIPSTICK: NEGATIVE
KETONES UR: NEGATIVE mg/dL
Nitrite: NEGATIVE
PH: 6 (ref 5.0–8.0)
PROTEIN: NEGATIVE mg/dL
Specific Gravity, Urine: 1.008 (ref 1.005–1.030)
Urobilinogen, UA: 1 mg/dL (ref 0.0–1.0)

## 2015-07-27 LAB — BASIC METABOLIC PANEL
Anion gap: 7 (ref 5–15)
BUN: 19 mg/dL (ref 6–20)
CALCIUM: 8.7 mg/dL — AB (ref 8.9–10.3)
CO2: 28 mmol/L (ref 22–32)
CREATININE: 0.67 mg/dL (ref 0.44–1.00)
Chloride: 104 mmol/L (ref 101–111)
GFR calc non Af Amer: >60 mL/min (ref >60)
GLUCOSE: 97 mg/dL (ref 65–99)
Potassium: 4.5 mmol/L (ref 3.5–5.1)
Sodium: 139 mmol/L (ref 135–145)

## 2015-07-27 MED ORDER — MECLIZINE HCL 12.5 MG PO TABS: 12.5000 mg | ORAL_TABLET | Freq: Three times a day (TID) | ORAL | Status: AC

## 2015-07-27 NOTE — ED Provider Notes (Addendum)
CSN: [id number]     Arrival date & time 07/27/15  1242 History   First MD Initiated Contact with Patient 07/27/15 1346     Chief Complaint  Patient presents with  . Dizziness     (Consider location/radiation/quality/duration/timing/severity/associated sxs/prior Treatment) HPI Patient presents after an episode of dizziness that occurred earlier today, approximately 8 hours ago. Patient notes that after awakening, and standing up, she had an episode of approximately 5 minutes or 10 minutes of dizziness, unsteadiness, with no lightheadedness, no syncope, no falling. Symptoms resolved with rest and there have been no recurrences.     Past Medical History  Diagnosis Date  . Paroxysmal atrial fibrillation (HCC)   . Left bundle branch block   . Hypertension   . Hyperlipidemia   . Chest pain   . Tobacco abuse   . COPD (chronic obstructive pulmonary disease) ([place])   . Personal history of subdural hematoma   . Cataract    Past Surgical History  Procedure Laterality Date  . Eye surgery    . Abdominal hysterectomy    . Appendectomy    . Brain surgery     Family History  Problem Relation Age of Onset  . Diabetes Mother   . Heart disease Mother   . Heart disease Father    Social History  Substance Use Topics  . Smoking status: Current Every Day Smoker    Types: Cigarettes    Start date: 07/30/1945  . Smokeless tobacco: None  . Alcohol Use: No   OB History    No data available     Review of Systems  Constitutional:       Per HPI, otherwise negative  HENT:       Per HPI, otherwise negative  Respiratory:       Per HPI, otherwise negative  Cardiovascular:       Per HPI, otherwise negative  Gastrointestinal: Negative for vomiting.  Endocrine:       Negative aside from HPI  Genitourinary:       Neg aside from HPI   Musculoskeletal:       Per HPI, otherwise negative  Skin: Negative.   Neurological: Positive for dizziness. Negative for syncope.      Allergies   Cefuroxime  Home Medications   Prior to Admission medications   Medication Sig Start Date End Date Taking? Authorizing Provider  acetaminophen (TYLENOL) 500 MG tablet Take 1,000 mg by mouth daily as needed for mild pain.   Yes Historical Provider, MD  albuterol (PROVENTIL HFA;VENTOLIN HFA) 108 (90 BASE) MCG/ACT inhaler Inhale 2 puffs into the lungs every 6 (six) hours as needed.   Yes Historical Provider, MD  albuterol (PROVENTIL) (2.5 MG/3ML) 0.083% nebulizer solution Take 3 mLs (2.5 mg total) by nebulization every 4 (four) hours as needed for wheezing. 04/06/12 07/27/15 Yes Roselee Culver, MD  aspirin 325 MG tablet Take 325 mg by mouth daily.   Yes Historical Provider, MD  atorvastatin (LIPITOR) 40 MG tablet Every other day   Yes Historical Provider, MD  captopril (CAPOTEN) 50 MG tablet Take 50 mg by mouth 2 (two) times daily.    Yes Historical Provider, MD  diazepam (VALIUM) 5 MG tablet Take 1 tablet by mouth daily as needed for anxiety.  12/12/13  Yes Historical Provider, MD  diphenhydrAMINE (BENADRYL) 25 MG tablet Take 25 mg by mouth every 6 (six) hours as needed for allergies.   Yes Historical Provider, MD  fish oil-omega-3 fatty acids 1000 MG capsule Take  1 g by mouth 2 (two) times daily.    Yes Historical Provider, MD  Heat Wraps [facility] BACK/HIP) MISC 1 patch by Does not apply route daily as needed (back pain).   Yes Historical Provider, MD  metoprolol succinate (TOPROL-XL) 50 MG 24 hr tablet Take 50 mg by mouth daily. 25 mg at night..   Yes Historical Provider, MD  Multiple Vitamin (MULTIVITAMIN WITH MINERALS) TABS tablet Take 1 tablet by mouth daily.   Yes Historical Provider, MD  OXYGEN Inhale 2 L into the lungs at bedtime.   Yes Historical Provider, MD  verapamil (COVERA HS) 240 MG (CO) 24 hr tablet Take 240 mg by mouth daily.    Yes Historical Provider, MD  ipratropium (ATROVENT) 0.02 % nebulizer solution Take 1.25 mLs (250 mcg total) by nebulization 4 (four) times daily.  04/06/12 02/14/14  Roselee Culver, MD  oxybutynin (DITROPAN XL) 5 MG 24 hr tablet Take 1 tablet (5 mg total) by mouth at bedtime. 07/13/15   Robyn Haber, MD  predniSONE (DELTASONE) 20 MG tablet Two daily with food Patient not taking: Reported on 07/27/2015 07/13/15   Robyn Haber, MD   BP 158/52 mmHg  Pulse 59  Temp(Src) 97.6 F (36.4 C) (Oral)  Resp 14  SpO2 93% Physical Exam  Constitutional: She is oriented to person, place, and time. She appears well-developed and well-nourished. No distress.  HENT:  Head: Normocephalic and atraumatic.  Eyes: Conjunctivae and EOM are normal.  Cardiovascular: Normal rate and regular rhythm.   Pulmonary/Chest: Effort normal and breath sounds normal. No stridor. No respiratory distress.  Abdominal: She exhibits no distension.  Musculoskeletal: She exhibits no edema.  Neurological: She is alert and oriented to person, place, and time. She displays no tremor. No cranial nerve deficit or sensory deficit. She exhibits normal muscle tone. She displays no seizure activity. Coordination normal.  Skin: Skin is warm and dry.  Psychiatric: She has a normal mood and affect.  Nursing note and vitals reviewed.   ED Course  Procedures (including critical care time) Labs Review Labs Reviewed  BASIC METABOLIC PANEL - Abnormal; Notable for the following:    Calcium 8.7 (*)    All other components within normal limits  CBC - Abnormal; Notable for the following:    WBC 11.7 (*)    Hemoglobin 9.4 (*)    HCT 33.4 (*)    MCV 77.5 (*)    MCH 21.8 (*)    MCHC 28.1 (*)    RDW 17.8 (*)    All other components within normal limits  URINALYSIS, ROUTINE W REFLEX MICROSCOPIC (NOT AT [facility]) - Abnormal; Notable for the following:    APPearance CLOUDY (*)    Leukocytes, UA SMALL (*)    All other components within normal limits  URINE MICROSCOPIC-ADD ON - Abnormal; Notable for the following:    Bacteria, UA MANY (*)    All other components within normal limits     Imaging Review Dg Chest 2 View  07/27/2015   CLINICAL DATA:  Dizziness since this morning.  EXAM: CHEST  2 VIEW  COMPARISON:  07/15/2014  FINDINGS: The lungs are hyperinflated likely secondary to COPD. There is mild left basilar airspace disease. There is no other focal parenchymal opacity. There is no pleural effusion or pneumothorax. The heart and mediastinal contours are unremarkable. There is thoracic aortic atherosclerosis.  The osseous structures are unremarkable.  IMPRESSION: Mild left basilar airspace disease which may reflect atelectasis versus pneumonia. Next item  COPD  Electronically Signed   By: Kathreen Devoid   On: 07/27/2015 14:36   Ct Head Wo Contrast  07/27/2015   CLINICAL DATA:  Dizziness for 1 day  EXAM: CT HEAD WITHOUT CONTRAST  TECHNIQUE: Contiguous axial images were obtained from the base of the skull through the vertex without intravenous contrast.  COMPARISON:  March 22, 2008 and February 09, 2008  FINDINGS: There is age related volume loss. There is no intracranial mass, hemorrhage, extra-axial fluid collection, or midline shift. Prior subdural hematomas are no longer appreciable. There is mild small vessel disease in the periventricular white matter bilaterally. Elsewhere gray-white compartments appear normal. No acute infarct is evident. There are burr holes on the right from prior subdural hematoma drainage. Bony calvarium otherwise appears intact. Mastoid air cells are clear.  IMPRESSION: Age related volume loss with mild periventricular small vessel disease. No intracranial mass, hemorrhage, extra-axial fluid collection, or acute appearing infarct. Postoperative burr holes on the right. Bony calvarium otherwise intact.   Electronically Signed   By: Lowella Grip III M.D.   On: 07/27/2015 14:43   I have personally reviewed and evaluated these images and lab results as part of my medical decision-making.   EKG Interpretation None     On return from CT the patient remarked  that she was having dizziness again after positioning for the study. MDM   Final diagnoses:  Dizziness   Well appearing elderly p/w episodic dizziness.  Her description of positional Sx, and the absence of objective neuro findings was reassuring.  Low suspicion for posterior CVA given the intermittent nature of her complaints. Sx likely BPV. Patient started on meds, will f/u as an outpatient.    Carmin Muskrat, MD 07/27/15 2133  Carmin Muskrat, MD 08/31/15 [phone]

## 2015-07-27 NOTE — ED Notes (Signed)
Pt states that when she got up this am around 630 she was dizzy that lasted about 30 mins then subsided.  Pt states that she is lightheaded now.

## 2015-07-27 NOTE — Discharge Instructions (Signed)
As discussed, your evaluation today has been largely reassuring.  But, it is important that you monitor your condition carefully, and do not hesitate to return to the ED if you develop new, or concerning changes in your condition.  Otherwise, please follow-up with your physician for appropriate ongoing care.   Dizziness Dizziness is a common problem. It is a feeling of unsteadiness or light-headedness. You may feel like you are about to faint. Dizziness can lead to injury if you stumble or fall. Anyone can become dizzy, but dizziness is more common in older adults. This condition can be caused by a number of things, including medicines, dehydration, or illness. HOME CARE INSTRUCTIONS Taking these steps may help with your condition: Eating and Drinking  Drink enough fluid to keep your urine clear or pale yellow. This helps to keep you from becoming dehydrated. Try to drink more clear fluids, such as water.  Do not drink alcohol.  Limit your caffeine intake if directed by your health care provider.  Limit your salt intake if directed by your health care provider. Activity  Avoid making quick movements.  Rise slowly from chairs and steady yourself until you feel okay.  In the morning, first sit up on the side of the bed. When you feel okay, stand slowly while you hold onto something until you know that your balance is fine.  Move your legs often if you need to stand in one place for a long time. Tighten and relax your muscles in your legs while you are standing.  Do not drive or operate heavy machinery if you feel dizzy.  Avoid bending down if you feel dizzy. Place items in your home so that they are easy for you to reach without leaning over. Lifestyle  Do not use any tobacco products, including cigarettes, chewing tobacco, or electronic cigarettes. If you need help quitting, ask your health care provider.  Try to reduce your stress level, such as with yoga or meditation. Talk with  your health care provider if you need help. General Instructions  Watch your dizziness for any changes.  Take medicines only as directed by your health care provider. Talk with your health care provider if you think that your dizziness is caused by a medicine that you are taking.  Tell a friend or a family member that you are feeling dizzy. If he or she notices any changes in your behavior, have this person call your health care provider.  Keep all follow-up visits as directed by your health care provider. This is important. SEEK MEDICAL CARE IF:  Your dizziness does not go away.  Your dizziness or light-headedness gets worse.  You feel nauseous.  You have reduced hearing.  You have new symptoms.  You are unsteady on your feet or you feel like the room is spinning. SEEK IMMEDIATE MEDICAL CARE IF:  You vomit or have diarrhea and are unable to eat or drink anything.  You have problems talking, walking, swallowing, or using your arms, hands, or legs.  You feel generally weak.  You are not thinking clearly or you have trouble forming sentences. It may take a friend or family member to notice this.  You have chest pain, abdominal pain, shortness of breath, or sweating.  Your vision changes.  You notice any bleeding.  You have a headache.  You have neck pain or a stiff neck.  You have a fever.   This information is not intended to replace advice given to you by your health  care provider. Make sure you discuss any questions you have with your health care provider.   Document Released: 04/02/2001 Document Revised: 02/21/2015 Document Reviewed: 10/03/2014 Elsevier Interactive Patient Education [employer].

## 2015-07-27 NOTE — ED Notes (Signed)
Pt reports dizziness for about 5-10 minutes early this am.  Which pt denies at this time.

## 2015-08-01 DIAGNOSIS — J449 Chronic obstructive pulmonary disease, unspecified: Secondary | ICD-10-CM | POA: Diagnosis not present

## 2015-08-22 DIAGNOSIS — I482 Chronic atrial fibrillation: Secondary | ICD-10-CM | POA: Diagnosis not present

## 2015-08-22 DIAGNOSIS — E782 Mixed hyperlipidemia: Secondary | ICD-10-CM | POA: Diagnosis not present

## 2015-08-22 DIAGNOSIS — I10 Essential (primary) hypertension: Secondary | ICD-10-CM | POA: Diagnosis not present

## 2015-08-22 DIAGNOSIS — M159 Polyosteoarthritis, unspecified: Secondary | ICD-10-CM | POA: Diagnosis not present

## 2015-08-22 DIAGNOSIS — N390 Urinary tract infection, site not specified: Secondary | ICD-10-CM | POA: Diagnosis not present

## 2015-08-22 DIAGNOSIS — F411 Generalized anxiety disorder: Secondary | ICD-10-CM | POA: Diagnosis not present

## 2015-08-29 DIAGNOSIS — J432 Centrilobular emphysema: Secondary | ICD-10-CM | POA: Diagnosis not present

## 2015-08-29 DIAGNOSIS — D509 Iron deficiency anemia, unspecified: Secondary | ICD-10-CM | POA: Diagnosis not present

## 2015-08-29 DIAGNOSIS — E782 Mixed hyperlipidemia: Secondary | ICD-10-CM | POA: Diagnosis not present

## 2015-08-29 DIAGNOSIS — Z780 Asymptomatic menopausal state: Secondary | ICD-10-CM | POA: Diagnosis not present

## 2015-08-29 DIAGNOSIS — Z0000 Encounter for general adult medical examination without abnormal findings: Secondary | ICD-10-CM | POA: Diagnosis not present

## 2015-09-01 DIAGNOSIS — J449 Chronic obstructive pulmonary disease, unspecified: Secondary | ICD-10-CM | POA: Diagnosis not present

## 2015-10-01 DIAGNOSIS — J449 Chronic obstructive pulmonary disease, unspecified: Secondary | ICD-10-CM | POA: Diagnosis not present

## 2015-11-01 DIAGNOSIS — J449 Chronic obstructive pulmonary disease, unspecified: Secondary | ICD-10-CM | POA: Diagnosis not present

## 2015-11-14 ENCOUNTER — Encounter: Payer: Self-pay | Admitting: Cardiovascular Disease

## 2015-11-14 ENCOUNTER — Ambulatory Visit (INDEPENDENT_AMBULATORY_CARE_PROVIDER_SITE_OTHER): Payer: Medicare Other | Admitting: Cardiovascular Disease

## 2015-11-14 VITALS — BP 166/74 | HR 68 | Ht 67.0 in | Wt 153.0 lb

## 2015-11-14 DIAGNOSIS — Z720 Tobacco use: Secondary | ICD-10-CM

## 2015-11-14 DIAGNOSIS — I10 Essential (primary) hypertension: Secondary | ICD-10-CM | POA: Diagnosis not present

## 2015-11-14 DIAGNOSIS — E785 Hyperlipidemia, unspecified: Secondary | ICD-10-CM | POA: Diagnosis not present

## 2015-11-14 NOTE — Patient Instructions (Signed)

## 2015-11-14 NOTE — Progress Notes (Signed)
11/14/2015 [name]   1923/11/11  [id number]  Primary Physician Merrilee Seashore, MD Primary Cardiologist: Lorretta Harp MD Renae Gloss   HPI:  [name] is a 82 year old divorced Caucasian female mother of 85, grandmother great-grandchildren referred to me by [name]. I last saw her 10/26/14. She is accompanied by her son [name] today.  Her cardiac risk factor profile is positive for 70 pack year history of tobacco abuse, hypertension, hyperlipidemia and family history. Her brother had bypass surgery and her father died sudden cardiac death. She has never had a heart attack or stroke. She complained of dyspnea on exertion and a 3 month history of daily chest pain prior to her initial presentation.. Because of her symptoms I performed a 2-D echocardiogram which was entirely normal and a Myoview stress test which likewise was normal. I reassured [name] that her chest pain is most likely noncardiac. Since I saw her a year ago her symptoms have not changed in frequency or severity.   Current Outpatient Prescriptions  Medication Sig Dispense Refill  . acetaminophen (TYLENOL) 500 MG tablet Take 1,000 mg by mouth daily as needed for mild pain.    Marland Kitchen albuterol (PROVENTIL HFA;VENTOLIN HFA) 108 (90 BASE) MCG/ACT inhaler Inhale 2 puffs into the lungs every 6 (six) hours as needed.    Marland Kitchen aspirin 325 MG tablet Take 325 mg by mouth daily.    Marland Kitchen atorvastatin (LIPITOR) 40 MG tablet Every other day    . captopril (CAPOTEN) 50 MG tablet Take 50 mg by mouth 2 (two) times daily.     . diazepam (VALIUM) 5 MG tablet Take 1 tablet by mouth daily as needed for anxiety.     . diphenhydrAMINE (BENADRYL) 25 MG tablet Take 25 mg by mouth every 6 (six) hours as needed for allergies.    . fish oil-omega-3 fatty acids 1000 MG capsule Take 1 g by mouth 2 (two) times daily.     Marland Kitchen Heat Wraps (THERMACARE BACK/HIP) MISC 1 patch by Does not apply route daily as needed (back pain).    .  meclizine (ANTIVERT) 12.5 MG tablet Take 1 tablet (12.5 mg total) by mouth 3 (three) times daily. 21 tablet 0  . metoprolol succinate (TOPROL-XL) 50 MG 24 hr tablet Take 50 mg by mouth daily. 25 mg at night..    . Multiple Vitamin (MULTIVITAMIN WITH MINERALS) TABS tablet Take 1 tablet by mouth daily.    Marland Kitchen oxybutynin (DITROPAN XL) 5 MG 24 hr tablet Take 1 tablet (5 mg total) by mouth at bedtime. 30 tablet 1  . OXYGEN Inhale 2 L into the lungs at bedtime.    . predniSONE (DELTASONE) 20 MG tablet Two daily with food 10 tablet 0  . verapamil (COVERA HS) 240 MG (CO) 24 hr tablet Take 240 mg by mouth daily.     Marland Kitchen albuterol (PROVENTIL) (2.5 MG/3ML) 0.083% nebulizer solution Take 3 mLs (2.5 mg total) by nebulization every 4 (four) hours as needed for wheezing. 150 vial 1  . ipratropium (ATROVENT) 0.02 % nebulizer solution Take 1.25 mLs (250 mcg total) by nebulization 4 (four) times daily. 75 mL 12   No current facility-administered medications for this visit.    Allergies  Allergen Reactions  . Cefuroxime Nausea And Vomiting    Social History   Social History  . Marital Status: Divorced    Spouse Name: N/A  . Number of Children: N/A  . Years of Education: N/A   Occupational History  .  Not on file.   Social History Main Topics  . Smoking status: Current Every Day Smoker    Types: Cigarettes    Start date: 07/30/1945  . Smokeless tobacco: Never Used  . Alcohol Use: No  . Drug Use: No  . Sexual Activity: Not on file   Other Topics Concern  . Not on file   Social History Narrative     Review of Systems: General: negative for chills, fever, night sweats or weight changes.  Cardiovascular: negative for chest pain, dyspnea on exertion, edema, orthopnea, palpitations, paroxysmal nocturnal dyspnea or shortness of breath Dermatological: negative for rash Respiratory: negative for cough or wheezing Urologic: negative for hematuria Abdominal: negative for nausea, vomiting, diarrhea,  bright red blood per rectum, melena, or hematemesis Neurologic: negative for visual changes, syncope, or dizziness All other systems reviewed and are otherwise negative except as noted above.    Blood pressure 166/74, pulse 68, height 5\' 7"  (1.702 m), weight 153 lb (69.4 kg).  General appearance: alert and no distress Neck: no adenopathy, no carotid bruit, no JVD, supple, symmetrical, trachea midline and thyroid not enlarged, symmetric, no tenderness/mass/nodules Lungs: clear to auscultation bilaterally Heart: regular rate and rhythm, S1, S2 normal, no murmur, click, rub or gallop Extremities: extremities normal, atraumatic, no cyanosis or edema  EKG not performed today  ASSESSMENT AND PLAN:   Tobacco abuse Continued tobacco abuse recalcitrant to risk factor modification  Hyperlipidemia History of hyperlipidemia on atorvastatin followed by her PCP  Essential hypertension History of hypertension blood pressure measured at 166/74. She is on captopril, metoprolol and verapamil. Continue current meds at current dosing      Lorretta Harp MD [facility], [facility] 11/14/2015 10:48 AM

## 2015-11-14 NOTE — Assessment & Plan Note (Signed)
History of hyperlipidemia on atorvastatin followed by her PCP 

## 2015-11-14 NOTE — Assessment & Plan Note (Signed)
Continued tobacco abuse recalcitrant to risk factor modification 

## 2015-11-14 NOTE — Assessment & Plan Note (Signed)
History of hypertension blood pressure measured at 166/74. She is on captopril, metoprolol and verapamil. Continue current meds at current dosing

## 2015-11-23 ENCOUNTER — Ambulatory Visit (INDEPENDENT_AMBULATORY_CARE_PROVIDER_SITE_OTHER): Payer: Medicare Other | Admitting: Family Medicine

## 2015-11-23 VITALS — BP 116/70 | HR 70 | Temp 97.7°F | Resp 18 | Ht 67.0 in | Wt 151.2 lb

## 2015-11-23 DIAGNOSIS — M5431 Sciatica, right side: Secondary | ICD-10-CM

## 2015-11-23 MED ORDER — PREDNISONE 20 MG PO TABS: ORAL_TABLET | ORAL | Status: DC

## 2015-11-23 NOTE — Progress Notes (Signed)
° °  Subjective:    Patient ID: [name], female    DOB: [birth year], 82 y.o.   MRN: [id number] By signing my name below, I, Zola Button, attest that this documentation has been prepared under the direction and in the presence of Robyn Haber, MD.  Electronically Signed: Zola Button, Medical Scribe. 11/23/2015. 9:38 AM.  HPI HPI Comments: [name] is a 82 y.o. female who presents to the [facility] complaining of gradual onset, intermittent right-sided back pain radiating around into the hip that started after she woke up about a month ago. She does not have any pain when sitting still. The pain is worse with ambulation and when laying down flat. She has been taking Tylenol with relief for the pain. Patient denies rash. She also denies heavy lifting or falls. She has been able to sleep normally. Patient does admit to smoking.   Review of Systems  Musculoskeletal: Positive for back pain and arthralgias.  Skin: Negative for rash.       Objective:   Physical Exam CONSTITUTIONAL: Well developed/well nourished. Hard of hearing. HEAD: Normocephalic/atraumatic EYES: EOM/PERRL ENMT: Mucous membranes moist NECK: supple no meningeal signs SPINE: entire spine nontender CV: S1/S2 noted, no murmurs/rubs/gallops noted LUNGS: Wheezes present. ABDOMEN: soft, nontender, no rebound or guarding GU: no cva tenderness NEURO: Pt is awake/alert, moves all extremitiesx4. Fine tremor of her head. EXTREMITIES: pulses normal, full ROM SKIN: warm, color normal PSYCH: no abnormalities of mood noted        Assessment & Plan:   This chart was scribed in my presence and reviewed by me personally.    ICD-9-CM ICD-10-CM   1. Sciatica of right side 724.3 M54.31 predniSONE (DELTASONE) 20 MG tablet     Care order/instruction     Signed, Robyn Haber, MD g

## 2015-11-23 NOTE — Patient Instructions (Signed)

## 2015-12-02 DIAGNOSIS — J449 Chronic obstructive pulmonary disease, unspecified: Secondary | ICD-10-CM | POA: Diagnosis not present

## 2015-12-04 ENCOUNTER — Ambulatory Visit (INDEPENDENT_AMBULATORY_CARE_PROVIDER_SITE_OTHER): Payer: Medicare Other | Admitting: Family Medicine

## 2015-12-04 VITALS — BP 145/67 | HR 78 | Temp 97.9°F | Resp 16 | Wt 150.4 lb

## 2015-12-04 DIAGNOSIS — R2689 Other abnormalities of gait and mobility: Secondary | ICD-10-CM

## 2015-12-04 DIAGNOSIS — N3001 Acute cystitis with hematuria: Secondary | ICD-10-CM

## 2015-12-04 DIAGNOSIS — R197 Diarrhea, unspecified: Secondary | ICD-10-CM

## 2015-12-04 LAB — POCT URINALYSIS DIP (MANUAL ENTRY)
Bilirubin, UA: NEGATIVE
Glucose, UA: NEGATIVE
Ketones, POC UA: NEGATIVE
NITRITE UA: POSITIVE — AB
PH UA: 6.5
Protein Ur, POC: NEGATIVE
Spec Grav, UA: 1.02
UROBILINOGEN UA: 0.2

## 2015-12-04 LAB — COMPREHENSIVE METABOLIC PANEL
ALBUMIN: 3.5 g/dL — AB (ref 3.6–5.1)
ALT: 16 U/L (ref 6–29)
AST: 14 U/L (ref 10–35)
Alkaline Phosphatase: 82 U/L (ref 33–130)
BUN: 38 mg/dL — ABNORMAL HIGH (ref 7–25)
CALCIUM: 9.1 mg/dL (ref 8.6–10.4)
CHLORIDE: 101 mmol/L (ref 98–110)
CO2: 34 mmol/L — AB (ref 20–31)
Creat: 0.76 mg/dL (ref 0.60–0.88)
Glucose, Bld: 86 mg/dL (ref 65–99)
Potassium: 4.4 mmol/L (ref 3.5–5.3)
Sodium: 142 mmol/L (ref 135–146)
TOTAL PROTEIN: 6.3 g/dL (ref 6.1–8.1)
Total Bilirubin: 0.4 mg/dL (ref 0.2–1.2)

## 2015-12-04 LAB — POCT CBC
Granulocyte percent: 81.7 %G — AB (ref 37–80)
HCT, POC: 44.3 % (ref 37.7–47.9)
HEMOGLOBIN: 14.1 g/dL (ref 12.2–16.2)
LYMPH, POC: 2.1 (ref 0.6–3.4)
MCH: 27.5 pg (ref 27–31.2)
MCHC: 31.9 g/dL (ref 31.8–35.4)
MCV: 86.2 fL (ref 80–97)
MID (cbc): 0.4 (ref 0–0.9)
MPV: 7.6 fL (ref 0–99.8)
PLATELET COUNT, POC: 315 10*3/uL (ref 142–424)
POC Granulocyte: 11.4 — AB (ref 2–6.9)
POC LYMPH PERCENT: 15.4 %L (ref 10–50)
POC MID %: 2.9 %M (ref 0–12)
RBC: 5.13 M/uL (ref 4.04–5.48)
RDW, POC: 19.5 %
WBC: 13.9 10*3/uL — AB (ref 4.6–10.2)

## 2015-12-04 LAB — POC MICROSCOPIC URINALYSIS (UMFC)

## 2015-12-04 MED ORDER — CEFTRIAXONE SODIUM 1 G IJ SOLR
1.0000 g | Freq: Once | INTRAMUSCULAR | Status: AC
  Administered 2015-12-04: 1 g via INTRAMUSCULAR

## 2015-12-04 MED ORDER — SULFAMETHOXAZOLE-TRIMETHOPRIM 800-160 MG PO TABS: 1.0000 | ORAL_TABLET | Freq: Two times a day (BID) | ORAL | Status: DC

## 2015-12-04 NOTE — Patient Instructions (Signed)

## 2015-12-04 NOTE — Progress Notes (Signed)
Subjective:  By signing my name below, I, Essence Howell, attest that this documentation has been prepared under the direction and in the presence of Delman Cheadle, MD Electronically Signed: Ladene Artist, ED Scribe 12/04/2015 at 12:32 PM.   Patient ID: [name], female    DOB: 11-28-1923, 82 y.o.   MRN: [id number]  Chief Complaint  Patient presents with  . Dizziness    per patient started feeling this way after eating 12/03/2015  . Diarrhea   HPI HPI Comments: [name] is a 82 y.o. female who presents to the [facility] complaining of gradually improving diarrhea onset yesterday afternoon. Pt states that she noticed diarrhea with associated nausea, "clouded thinking" and constant dizziness after eating rabbit for lunch. She also reports "heaviness" in her lower extremities with ambulating since yesterday. Pt ambulates with a walker at baseline. Her son has noticed that she has been moving slower than normal today. She denies abdominal pain, vomiting, chest pain, SOB, new leg swelling, visual disturbances, sleep disturbances. Pt has tried blackberry juice last night which resolved diarrhea. She was able to tolerate a sausage biscuit and juice drink this morning.    Multiple risk factors for cardiovascular disease including a family hx of tobacco use. She sees cardiology with no h/o MI or CVA.   Past Medical History  Diagnosis Date  . Paroxysmal atrial fibrillation (HCC)   . Left bundle branch block   . Hypertension   . Hyperlipidemia   . Chest pain   . Tobacco abuse   . COPD (chronic obstructive pulmonary disease) ([place])   . Personal history of subdural hematoma   . Cataract    Current Outpatient Prescriptions on File Prior to Visit  Medication Sig Dispense Refill  . acetaminophen (TYLENOL) 500 MG tablet Take 1,000 mg by mouth daily as needed for mild pain.    Marland Kitchen albuterol (PROVENTIL HFA;VENTOLIN HFA) 108 (90 BASE) MCG/ACT inhaler Inhale 2 puffs into the  lungs every 6 (six) hours as needed.    Marland Kitchen aspirin 325 MG tablet Take 325 mg by mouth daily.    Marland Kitchen atorvastatin (LIPITOR) 40 MG tablet Every other day    . diazepam (VALIUM) 5 MG tablet Take 1 tablet by mouth daily as needed for anxiety.     . fish oil-omega-3 fatty acids 1000 MG capsule Take 1 g by mouth 2 (two) times daily.     . meclizine (ANTIVERT) 12.5 MG tablet Take 1 tablet (12.5 mg total) by mouth 3 (three) times daily. 21 tablet 0  . metoprolol succinate (TOPROL-XL) 50 MG 24 hr tablet Take 50 mg by mouth daily. 25 mg at night..    . Multiple Vitamin (MULTIVITAMIN WITH MINERALS) TABS tablet Take 1 tablet by mouth daily. Reported on 12/04/2015    . OXYGEN Inhale 2 L into the lungs at bedtime. Reported on 12/04/2015    . verapamil (COVERA HS) 240 MG (CO) 24 hr tablet Take 240 mg by mouth daily.     Marland Kitchen albuterol (PROVENTIL) (2.5 MG/3ML) 0.083% nebulizer solution Take 3 mLs (2.5 mg total) by nebulization every 4 (four) hours as needed for wheezing. 150 vial 1  . captopril (CAPOTEN) 50 MG tablet Take 50 mg by mouth 2 (two) times daily.     . diphenhydrAMINE (BENADRYL) 25 MG tablet Take 25 mg by mouth every 6 (six) hours as needed for allergies. Reported on 12/04/2015    . Heat Wraps (THERMACARE BACK/HIP) MISC 1 patch by Does not  apply route daily as needed (back pain).    Marland Kitchen ipratropium (ATROVENT) 0.02 % nebulizer solution Take 1.25 mLs (250 mcg total) by nebulization 4 (four) times daily. 75 mL 12  . oxybutynin (DITROPAN XL) 5 MG 24 hr tablet Take 1 tablet (5 mg total) by mouth at bedtime. 30 tablet 1  . predniSONE (DELTASONE) 20 MG tablet Two daily with food (Patient not taking: Reported on 12/04/2015) 10 tablet 0   No current facility-administered medications on file prior to visit.   Allergies  Allergen Reactions  . Cefuroxime Nausea And Vomiting   Review of Systems  Eyes: Negative for visual disturbance.  Respiratory: Negative for shortness of breath.   Cardiovascular: Negative for chest  pain and leg swelling.  Gastrointestinal: Positive for nausea and diarrhea. Negative for vomiting and abdominal pain.  Neurological: Positive for dizziness.  Psychiatric/Behavioral: Negative for sleep disturbance.      Objective:   Physical Exam  Constitutional: She is oriented to person, place, and time. She appears well-developed and well-nourished. No distress.  HENT:  Head: Normocephalic and atraumatic.  Eyes: Conjunctivae and EOM are normal.  Neck: Neck supple. No tracheal deviation present.  Cardiovascular: Normal rate, regular rhythm and normal heart sounds.   Pulmonary/Chest: Effort normal and breath sounds normal. No respiratory distress.  Musculoskeletal: Normal range of motion.  Neurological: She is alert and oriented to person, place, and time. No cranial nerve deficit.  Normal grasp. Negative pronator drift.   Skin: Skin is warm and dry.  Psychiatric: She has a normal mood and affect. Her behavior is normal.  Nursing note and vitals reviewed.  Orthostatic VS for the past 24 hrs:  BP- Lying Pulse- Lying BP- Sitting Pulse- Sitting BP- Standing at 0 minutes Pulse- Standing at 0 minutes  12/04/15 1336 147/71 mmHg 71 150/75 mmHg 73 136/74 mmHg 77    EKG: LBBB, sinus rhythm    Assessment & Plan:   1. Diarrhea, unspecified type   2. Imbalance   3. Acute cystitis with hematuria     Orders Placed This Encounter  Procedures  . Urine culture  . Comprehensive metabolic panel  . Orthostatic vital signs  . POCT urinalysis dipstick  . POCT Microscopic Urinalysis ([facility])  . POCT CBC  . EKG 12-Lead    Meds ordered this encounter  Medications  . sulfamethoxazole-trimethoprim (BACTRIM DS,SEPTRA DS) 800-160 MG tablet    Sig: Take 1 tablet by mouth 2 (two) times daily.    Dispense:  14 tablet    Refill:  0  . cefTRIAXone (ROCEPHIN) injection 1 g    Sig:     Order Specific Question:  Antibiotic Indication:    Answer:  UTI    I personally performed the services  described in this documentation, which was scribed in my presence. The recorded information has been reviewed and considered, and addended by me as needed.  Delman Cheadle, MD MPH  Results for orders placed or performed in visit on 12/04/15  Urine culture  Result Value Ref Range   Culture ESCHERICHIA COLI    Colony Count >=100,000 COLONIES/ML    Organism ID, Bacteria ESCHERICHIA COLI       Susceptibility   Escherichia coli -  (no method available)    AMPICILLIN <=2 Sensitive     AMOX/CLAVULANIC <=2 Sensitive     AMPICILLIN/SULBACTAM <=2 Sensitive     PIP/TAZO <=4 Sensitive     IMIPENEM <=0.25 Sensitive     CEFAZOLIN <=4 Not Reportable     CEFTRIAXONE <=  1 Sensitive     CEFTAZIDIME <=1 Sensitive     CEFEPIME <=1 Sensitive     GENTAMICIN <=1 Sensitive     TOBRAMYCIN <=1 Sensitive     CIPROFLOXACIN <=0.25 Sensitive     LEVOFLOXACIN <=0.12 Sensitive     NITROFURANTOIN <=16 Sensitive     TRIMETH/SULFA* <=20 Sensitive      * NR=NOT REPORTABLE,SEE COMMENTORAL therapy:A cefazolin MIC of <32 predicts susceptibility to the oral agents cefaclor,cefdinir,cefpodoxime,cefprozil,cefuroxime,cephalexin,and loracarbef when used for therapy of uncomplicated UTIs due to E.coli,K.pneumomiae,and P.mirabilis. PARENTERAL therapy: A cefazolinMIC of >8 indicates resistance to parenteralcefazolin. An alternate test method must beperformed to confirm susceptibility to parenteralcefazolin.  Comprehensive metabolic panel  Result Value Ref Range   Sodium 142 135 - 146 mmol/L   Potassium 4.4 3.5 - 5.3 mmol/L   Chloride 101 98 - 110 mmol/L   CO2 34 (H) 20 - 31 mmol/L   Glucose, Bld 86 65 - 99 mg/dL   BUN 38 (H) 7 - 25 mg/dL   Creat 0.76 0.60 - 0.88 mg/dL   Total Bilirubin 0.4 0.2 - 1.2 mg/dL   Alkaline Phosphatase 82 33 - 130 U/L   AST 14 10 - 35 U/L   ALT 16 6 - 29 U/L   Total Protein 6.3 6.1 - 8.1 g/dL   Albumin 3.5 (L) 3.6 - 5.1 g/dL   Calcium 9.1 8.6 - 10.4 mg/dL  POCT urinalysis dipstick  Result Value Ref  Range   Color, UA yellow yellow   Clarity, UA cloudy (A) clear   Glucose, UA negative negative   Bilirubin, UA negative negative   Ketones, POC UA negative negative   Spec Grav, UA 1.020    Blood, UA moderate (A) negative   pH, UA 6.5    Protein Ur, POC negative negative   Urobilinogen, UA 0.2    Nitrite, UA Positive (A) Negative   Leukocytes, UA Trace (A) Negative  POCT Microscopic Urinalysis ([facility])  Result Value Ref Range   WBC,UR,HPF,POC Moderate (A) None WBC/hpf   RBC,UR,HPF,POC Moderate (A) None RBC/hpf   Bacteria Many (A) None, Too numerous to count   Mucus Present (A) Absent   Epithelial Cells, UR Per Microscopy Few (A) None, Too numerous to count cells/hpf  POCT CBC  Result Value Ref Range   WBC 13.9 (A) 4.6 - 10.2 K/uL   Lymph, poc 2.1 0.6 - 3.4   POC LYMPH PERCENT 15.4 10 - 50 %L   MID (cbc) 0.4 0 - 0.9   POC MID % 2.9 0 - 12 %M   POC Granulocyte 11.4 (A) 2 - 6.9   Granulocyte percent 81.7 (A) 37 - 80 %G   RBC 5.13 4.04 - 5.48 M/uL   Hemoglobin 14.1 12.2 - 16.2 g/dL   HCT, POC 44.3 37.7 - 47.9 %   MCV 86.2 80 - 97 fL   MCH, POC 27.5 27 - 31.2 pg   MCHC 31.9 31.8 - 35.4 g/dL   RDW, POC 19.5 %   Platelet Count, POC 315 142 - 424 K/uL   MPV 7.6 0 - 99.8 fL

## 2015-12-07 ENCOUNTER — Telehealth: Payer: Self-pay

## 2015-12-07 LAB — URINE CULTURE

## 2015-12-07 NOTE — Telephone Encounter (Signed)
Pt is still not any better with her UTI and would like something stronger called in   Best [phone]

## 2015-12-08 NOTE — Telephone Encounter (Signed)
Thanks, agree.

## 2015-12-08 NOTE — Telephone Encounter (Signed)
Patient states that she still feels bad. She says that she feels weak, no nausea or vomiting. Has been hydrating well, taking Septra. I reviewed results with her and let her know that she is on appropriate antibiotic therapy and this includes ceftriaxone which she received in clinic. I advised patient to continue treatment, rtc tomorrow for recheck if she continues to feel ill. She agreed.

## 2015-12-26 ENCOUNTER — Telehealth: Payer: Self-pay | Admitting: *Deleted

## 2015-12-26 ENCOUNTER — Ambulatory Visit (INDEPENDENT_AMBULATORY_CARE_PROVIDER_SITE_OTHER): Payer: Medicare Other | Admitting: Family Medicine

## 2015-12-26 VITALS — BP 118/52 | HR 80 | Temp 97.8°F | Resp 18 | Ht 67.5 in | Wt 151.0 lb

## 2015-12-26 DIAGNOSIS — N3090 Cystitis, unspecified without hematuria: Secondary | ICD-10-CM | POA: Diagnosis not present

## 2015-12-26 DIAGNOSIS — R300 Dysuria: Secondary | ICD-10-CM

## 2015-12-26 LAB — POC MICROSCOPIC URINALYSIS (UMFC): Mucus: ABSENT

## 2015-12-26 LAB — POCT URINALYSIS DIP (MANUAL ENTRY)
Bilirubin, UA: NEGATIVE
Blood, UA: NEGATIVE
Glucose, UA: NEGATIVE
Nitrite, UA: NEGATIVE
PH UA: 7
PROTEIN UA: NEGATIVE
Spec Grav, UA: 1.02
UROBILINOGEN UA: 2

## 2015-12-26 MED ORDER — NITROFURANTOIN MONOHYD MACRO 100 MG PO CAPS: 100.0000 mg | ORAL_CAPSULE | Freq: Two times a day (BID) | ORAL | Status: DC

## 2015-12-26 NOTE — Telephone Encounter (Signed)
Yep, pt needs to be seen

## 2015-12-26 NOTE — Progress Notes (Signed)
   Subjective:    Patient ID: [name], female    DOB: November 08, 1923, 82 y.o.   MRN: [id number]  HPI This is a pleasant 82 yo female who is brought in by her son. She presents today with several days of right sided low back pain and dysuria. Urine feels hot. No fever. Mild nausea this morning. No vomiting. Able to eat normally. Was treated 12/04/15 for cystitis with Septra DS with full resolution of symptoms.   Past Medical History  Diagnosis Date  . Paroxysmal atrial fibrillation (HCC)   . Left bundle branch block   . Hypertension   . Hyperlipidemia   . Chest pain   . Tobacco abuse   . COPD (chronic obstructive pulmonary disease) ([place])   . Personal history of subdural hematoma   . Cataract    Past Surgical History  Procedure Laterality Date  . Eye surgery    . Abdominal hysterectomy    . Appendectomy    . Brain surgery     Family History  Problem Relation Age of Onset  . Diabetes Mother   . Heart disease Mother   . Heart disease Father    Review of Systems  Constitutional: Negative for fever, chills and fatigue.  Respiratory: Negative for cough, chest tightness and shortness of breath.   Cardiovascular: Negative for chest pain and leg swelling.  Gastrointestinal: Positive for nausea. Negative for vomiting, abdominal pain, diarrhea and constipation.  Genitourinary: Positive for dysuria (urine feels hot) and flank pain (bilateral). Negative for urgency, frequency, hematuria and pelvic pain.       Objective:   Physical Exam  Constitutional: She is oriented to person, place, and time. She appears well-developed and well-nourished.  HENT:  Head: Normocephalic and atraumatic.  Eyes: Conjunctivae are normal.  Cardiovascular: Normal rate, regular rhythm and normal heart sounds.   Pulmonary/Chest: Effort normal and breath sounds normal.  Abdominal: Soft. Bowel sounds are normal. She exhibits no distension. There is no tenderness. There is no rebound, no guarding and no CVA  tenderness.  Musculoskeletal: She exhibits no edema.  Neurological: She is alert and oriented to person, place, and time.  Skin: Skin is warm and dry.  Psychiatric: She has a normal mood and affect. Her behavior is normal. Judgment and thought content normal.  Vitals reviewed.  BP 118/52 mmHg  Pulse 80  Temp(Src) 97.8 F (36.6 C) (Oral)  Resp 18  Ht 5' 7.5" (1.715 m)  Wt 151 lb (68.493 kg)  BMI 23.29 kg/m2  SpO2 92% Wt Readings from Last 3 Encounters:  12/26/15 151 lb (68.493 kg)  12/04/15 150 lb 6.4 oz (68.221 kg)  11/23/15 151 lb 3.2 oz (68.584 kg)       Assessment & Plan:  1. Dysuria - POCT Microscopic Urinalysis ([facility]) - POCT urinalysis dipstick  2. Cystitis - Urine culture - nitrofurantoin, macrocrystal-monohydrate, (MACROBID) 100 MG capsule; Take 1 capsule (100 mg total) by mouth 2 (two) times daily.  Dispense: 14 capsule; Refill: 0 - Reviewed RTC precautions with patient and her son  Clarene Reamer, FNP-BC  [facility]  12/29/2015 5:05 PM

## 2015-12-26 NOTE — Telephone Encounter (Signed)
Pt called and stated that she is having a the same symptoms as her last visit.  She is having back pain and her urine feels hot.  Advise that she come in to be evaluated.  Pt agrees.

## 2015-12-26 NOTE — Patient Instructions (Signed)
Please come back in if you develop a fever over 100, vomiting that won't stop or worsening pain  Nitrofurantoin tablets or capsules What is this medicine? NITROFURANTOIN (nye troe fyoor AN toyn) is an antibiotic. It is used to treat urinary tract infections. This medicine may be used for other purposes; ask your health care provider or pharmacist if you have questions. What should I tell my health care provider before I take this medicine? They need to know if you have any of these conditions: -anemia -diabetes -glucose-6-phosphate dehydrogenase deficiency -kidney disease -liver disease -lung disease -other chronic illness -an unusual or allergic reaction to nitrofurantoin, other antibiotics, other medicines, foods, dyes or preservatives -pregnant or trying to get pregnant -breast-feeding How should I use this medicine? Take this medicine by mouth with a glass of water. Follow the directions on the prescription label. Take this medicine with food or milk. Take your doses at regular intervals. Do not take your medicine more often than directed. Do not stop taking except on your doctor's advice. Talk to your pediatrician regarding the use of this medicine in children. While this drug may be prescribed for selected conditions, precautions do apply. Overdosage: If you think you have taken too much of this medicine contact a poison control center or emergency room at once. NOTE: This medicine is only for you. Do not share this medicine with others. What if I miss a dose? If you miss a dose, take it as soon as you can. If it is almost time for your next dose, take only that dose. Do not take double or extra doses. What may interact with this medicine? -antacids containing magnesium trisilicate -probenecid -quinolone antibiotics like ciprofloxacin, lomefloxacin, norfloxacin and ofloxacin -sulfinpyrazone This list may not describe all possible interactions. Give your health care provider a list  of all the medicines, herbs, non-prescription drugs, or dietary supplements you use. Also tell them if you smoke, drink alcohol, or use illegal drugs. Some items may interact with your medicine. What should I watch for while using this medicine? Tell your doctor or health care professional if your symptoms do not improve or if you get new symptoms. Drink several glasses of water a day. If you are taking this medicine for a long time, visit your doctor for regular checks on your progress. If you are diabetic, you may get a false positive result for sugar in your urine with certain brands of urine tests. Check with your doctor. What side effects may I notice from receiving this medicine? Side effects that you should report to your doctor or health care professional as soon as possible: -allergic reactions like skin rash or hives, swelling of the face, lips, or tongue -chest pain -cough -difficulty breathing -dizziness, drowsiness -fever or infection -joint aches or pains -pale or blue-tinted skin -redness, blistering, peeling or loosening of the skin, including inside the mouth -tingling, burning, pain, or numbness in hands or feet -unusual bleeding or bruising -unusually weak or tired -yellowing of eyes or skin Side effects that usually do not require medical attention (report to your doctor or health care professional if they continue or are bothersome): -dark urine -diarrhea -headache -loss of appetite -nausea or vomiting -temporary hair loss This list may not describe all possible side effects. Call your doctor for medical advice about side effects. You may report side effects to FDA at 1-800-FDA-1088. Where should I keep my medicine? Keep out of the reach of children. Store at room temperature between 15 and 30 degrees  C (59 and 86 degrees F). Protect from light. Throw away any unused medicine after the expiration date. NOTE: This sheet is a summary. It may not cover all possible  information. If you have questions about this medicine, talk to your doctor, pharmacist, or health care provider.    2016, Elsevier/Gold Standard. (2008-04-27 15:56:47) Urinary Tract Infection Urinary tract infections (UTIs) can develop anywhere along your urinary tract. Your urinary tract is your body's drainage system for removing wastes and extra water. Your urinary tract includes two kidneys, two ureters, a bladder, and a urethra. Your kidneys are a pair of bean-shaped organs. Each kidney is about the size of your fist. They are located below your ribs, one on each side of your spine. CAUSES Infections are caused by microbes, which are microscopic organisms, including fungi, viruses, and bacteria. These organisms are so small that they can only be seen through a microscope. Bacteria are the microbes that most commonly cause UTIs. SYMPTOMS  Symptoms of UTIs may vary by age and gender of the patient and by the location of the infection. Symptoms in young women typically include a frequent and intense urge to urinate and a painful, burning feeling in the bladder or urethra during urination. Older women and men are more likely to be tired, shaky, and weak and have muscle aches and abdominal pain. A fever may mean the infection is in your kidneys. Other symptoms of a kidney infection include pain in your back or sides below the ribs, nausea, and vomiting. DIAGNOSIS To diagnose a UTI, your caregiver will ask you about your symptoms. Your caregiver will also ask you to provide a urine sample. The urine sample will be tested for bacteria and white blood cells. White blood cells are made by your body to help fight infection. TREATMENT  Typically, UTIs can be treated with medication. Because most UTIs are caused by a bacterial infection, they usually can be treated with the use of antibiotics. The choice of antibiotic and length of treatment depend on your symptoms and the type of bacteria causing your  infection. HOME CARE INSTRUCTIONS  If you were prescribed antibiotics, take them exactly as your caregiver instructs you. Finish the medication even if you feel better after you have only taken some of the medication.  Drink enough water and fluids to keep your urine clear or pale yellow.  Avoid caffeine, tea, and carbonated beverages. They tend to irritate your bladder.  Empty your bladder often. Avoid holding urine for long periods of time.  Empty your bladder before and after sexual intercourse.  After a bowel movement, women should cleanse from front to back. Use each tissue only once. SEEK MEDICAL CARE IF:   You have back pain.  You develop a fever.  Your symptoms do not begin to resolve within 3 days. SEEK IMMEDIATE MEDICAL CARE IF:   You have severe back pain or lower abdominal pain.  You develop chills.  You have nausea or vomiting.  You have continued burning or discomfort with urination. MAKE SURE YOU:   Understand these instructions.  Will watch your condition.  Will get help right away if you are not doing well or get worse.   This information is not intended to replace advice given to you by your health care provider. Make sure you discuss any questions you have with your health care provider.   Document Released: 07/17/2005 Document Revised: 06/28/2015 Document Reviewed: 11/15/2011 Elsevier Interactive Patient Education [employer].

## 2015-12-30 DIAGNOSIS — J449 Chronic obstructive pulmonary disease, unspecified: Secondary | ICD-10-CM | POA: Diagnosis not present

## 2015-12-30 LAB — URINE CULTURE

## 2016-01-09 ENCOUNTER — Ambulatory Visit (INDEPENDENT_AMBULATORY_CARE_PROVIDER_SITE_OTHER): Payer: Medicare Other | Admitting: Family Medicine

## 2016-01-09 VITALS — BP 130/58 | HR 71 | Temp 99.0°F | Resp 17 | Ht 67.0 in | Wt 154.0 lb

## 2016-01-09 DIAGNOSIS — J111 Influenza due to unidentified influenza virus with other respiratory manifestations: Secondary | ICD-10-CM | POA: Diagnosis not present

## 2016-01-09 MED ORDER — OSELTAMIVIR PHOSPHATE 75 MG PO CAPS: 75.0000 mg | ORAL_CAPSULE | Freq: Two times a day (BID) | ORAL | Status: DC

## 2016-01-09 NOTE — Patient Instructions (Signed)

## 2016-01-09 NOTE — Progress Notes (Addendum)
By signing my name below, I, Moises Blood, attest that this documentation has been prepared under the direction and in the presence of Robyn Haber, MD. Electronically Signed: Moises Blood, [place]. 01/09/2016 , 10:07 AM .  Patient was seen in room 11 .   Patient ID: [name] MRN: [id number], DOB: [birth year], 82 y.o. Date of Encounter: 01/09/2016  Primary Physician: Merrilee Seashore, MD  Chief Complaint:  Chief Complaint  Patient presents with   Cough   Abdominal Pain   Nasal Congestion    HPI:  [name] is a 82 y.o. female who presents to [facility] complaining of cough with nasal congestion and abdominal pain that started 10 days ago. She also mentions night sweats, myalgia, and appetite loss. She notes clear phlegm when she coughs. She's been using alka-seltzer, mucinex and drinking sunny-d. Her grandson went to visit her and he had the flu.   Past Medical History  Diagnosis Date   Paroxysmal atrial fibrillation (HCC)    Left bundle branch block    Hypertension    Hyperlipidemia    Chest pain    Tobacco abuse    COPD (chronic obstructive pulmonary disease) (HCC)    Personal history of subdural hematoma    Cataract      Home Meds: Prior to Admission medications   Medication Sig Start Date End Date Taking? Authorizing Provider  acetaminophen (TYLENOL) 500 MG tablet Take 1,000 mg by mouth daily as needed for mild pain.   Yes Historical Provider, MD  albuterol (PROVENTIL HFA;VENTOLIN HFA) 108 (90 BASE) MCG/ACT inhaler Inhale 2 puffs into the lungs every 6 (six) hours as needed.   Yes Historical Provider, MD  aspirin 325 MG tablet Take 325 mg by mouth daily.   Yes Historical Provider, MD  atorvastatin (LIPITOR) 40 MG tablet Every other day   Yes Historical Provider, MD  captopril (CAPOTEN) 50 MG tablet Take 50 mg by mouth 2 (two) times daily.    Yes Historical Provider, MD  diazepam (VALIUM) 5 MG tablet Take 1 tablet by  mouth daily as needed for anxiety.  12/12/13  Yes Historical Provider, MD  diphenhydrAMINE (BENADRYL) 25 MG tablet Take 25 mg by mouth every 6 (six) hours as needed for allergies. Reported on 12/04/2015   Yes Historical Provider, MD  fish oil-omega-3 fatty acids 1000 MG capsule Take 1 g by mouth 2 (two) times daily.    Yes Historical Provider, MD  Heat Wraps [facility] BACK/HIP) MISC 1 patch by Does not apply route daily as needed (back pain).   Yes Historical Provider, MD  meclizine (ANTIVERT) 12.5 MG tablet Take 1 tablet (12.5 mg total) by mouth 3 (three) times daily. 07/27/15  Yes Carmin Muskrat, MD  metoprolol succinate (TOPROL-XL) 50 MG 24 hr tablet Take 50 mg by mouth daily. 25 mg at night..   Yes Historical Provider, MD  Multiple Vitamin (MULTIVITAMIN WITH MINERALS) TABS tablet Take 1 tablet by mouth daily. Reported on 12/04/2015   Yes Historical Provider, MD  nitrofurantoin, macrocrystal-monohydrate, (MACROBID) 100 MG capsule Take 1 capsule (100 mg total) by mouth 2 (two) times daily. 12/26/15  Yes Elby Beck, FNP  oxybutynin (DITROPAN XL) 5 MG 24 hr tablet Take 1 tablet (5 mg total) by mouth at bedtime. 07/13/15  Yes Robyn Haber, MD  OXYGEN Inhale 2 L into the lungs at bedtime. Reported on 12/04/2015   Yes Historical Provider, MD  verapamil (COVERA HS) 240 MG (CO) 24 hr tablet Take 240 mg  by mouth daily.    Yes Historical Provider, MD  albuterol (PROVENTIL) (2.5 MG/3ML) 0.083% nebulizer solution Take 3 mLs (2.5 mg total) by nebulization every 4 (four) hours as needed for wheezing. 04/06/12 07/27/15  Roselee Culver, MD  ipratropium (ATROVENT) 0.02 % nebulizer solution Take 1.25 mLs (250 mcg total) by nebulization 4 (four) times daily. 04/06/12 02/14/14  Roselee Culver, MD    Allergies:  Allergies  Allergen Reactions   Cefuroxime Nausea And Vomiting    Social History   Social History   Marital Status: Divorced    Spouse Name: N/A   Number of Children: N/A   Years of  Education: N/A   Occupational History   Not on file.   Social History Main Topics   Smoking status: Current Every Day Smoker -- 1.00 packs/day for 73 years    Types: Cigarettes    Start date: 07/30/1945   Smokeless tobacco: Never Used   Alcohol Use: No   Drug Use: No   Sexual Activity: Not on file   Other Topics Concern   Not on file   Social History Narrative     Review of Systems: Constitutional: negative for fever, chills, weight changes; positive for appetite loss, night sweats, fatigue HEENT: negative for vision changes, hearing loss, rhinorrhea, ST, epistaxis, or sinus pressure; positive for congestion Cardiovascular: negative for chest pain or palpitations Respiratory: negative for hemoptysis, wheezing, shortness of breath; positive for cough Abdominal: negative for nausea, vomiting, diarrhea, or constipation; positive for abdominal pain Dermatological: negative for rash Musc: positive for myalgia Neurologic: negative for headache, dizziness, or syncope All other systems reviewed and are otherwise negative with the exception to those above and in the HPI.  Physical Exam: Blood pressure 130/58, pulse 71, temperature 99 F (37.2 C), temperature source Oral, resp. rate 17, height 5\' 7"  (1.702 m), weight 154 lb (69.854 kg), SpO2 91 %., Body mass index is 24.11 kg/(m^2). General: Well developed, well nourished, in no acute distress. Head: Normocephalic, atraumatic, eyes without discharge, sclera non-icteric, nares are without discharge. Bilateral auditory canals clear, TM's are without perforation, pearly grey and translucent with reflective cone of light bilaterally. Oral cavity moist, posterior pharynx without exudate, erythema, peritonsillar abscess, or post nasal drip.  Neck: Supple. No thyromegaly. Full ROM. No lymphadenopathy. Lungs: bilateral sonorous ronchi Heart: RRR with S1 S2. No murmurs, rubs, or gallops appreciated. Abdomen: Soft, non-tender, non-distended  with normoactive bowel sounds. No hepatomegaly. No rebound/guarding. No obvious abdominal masses. Msk:  Strength and tone normal for age. Extremities/Skin: Warm and dry. No clubbing or cyanosis. No edema. No rashes or suspicious lesions. Neuro: Alert and oriented X 3. Moves all extremities spontaneously. Gait is normal. CNII-XII grossly in tact. Psych:  Responds to questions appropriately with a normal affect.   Labs: Results for orders placed or performed in visit on 12/26/15  Urine culture  Result Value Ref Range   Culture ENTEROCOCCUS SPECIES    Colony Count >=100,000 COLONIES/ML    Organism ID, Bacteria ENTEROCOCCUS SPECIES       Susceptibility   Enterococcus species -  (no method available)    AMPICILLIN <=2 Sensitive     LEVOFLOXACIN 1 Sensitive     NITROFURANTOIN <=16 Sensitive     VANCOMYCIN 1 Sensitive     TETRACYCLINE <=1 Sensitive   POCT Microscopic Urinalysis ([facility])  Result Value Ref Range   WBC,UR,HPF,POC Many (A) None WBC/hpf   RBC,UR,HPF,POC None None RBC/hpf   Bacteria Many (A) None, Too numerous to count  Mucus Absent Absent   Epithelial Cells, UR Per Microscopy Few (A) None, Too numerous to count cells/hpf  POCT urinalysis dipstick  Result Value Ref Range   Color, UA yellow yellow   Clarity, UA cloudy (A) clear   Glucose, UA negative negative   Bilirubin, UA negative negative   Ketones, POC UA small (15) (A) negative   Spec Grav, UA 1.020    Blood, UA negative negative   pH, UA 7.0    Protein Ur, POC negative negative   Urobilinogen, UA 2.0    Nitrite, UA Negative Negative   Leukocytes, UA small (1+) (A) Negative     ASSESSMENT AND PLAN:  82 y.o. year old female with  This chart was scribed in my presence and reviewed by me personally.    ICD-9-CM ICD-10-CM   1. Influenza with respiratory manifestation 487.1 J11.1 oseltamivir (TAMIFLU) 75 MG capsule      Signed, Robyn Haber, MD 01/09/2016 10:07 AM

## 2016-01-14 ENCOUNTER — Inpatient Hospital Stay (HOSPITAL_COMMUNITY)
Admission: EM | Admit: 2016-01-14 | Discharge: 2016-01-18 | DRG: 190 | Disposition: A | Discharge status: Skilled Nursing Facility | Payer: Medicare Other | Attending: Internal Medicine | Admitting: Internal Medicine

## 2016-01-14 ENCOUNTER — Emergency Department (HOSPITAL_COMMUNITY): Payer: Medicare Other

## 2016-01-14 ENCOUNTER — Encounter (HOSPITAL_COMMUNITY): Payer: Self-pay | Admitting: *Deleted

## 2016-01-14 DIAGNOSIS — J189 Pneumonia, unspecified organism: Secondary | ICD-10-CM | POA: Diagnosis not present

## 2016-01-14 DIAGNOSIS — L8995 Pressure ulcer of unspecified site, unstageable: Secondary | ICD-10-CM | POA: Diagnosis not present

## 2016-01-14 DIAGNOSIS — L8990 Pressure ulcer of unspecified site, unspecified stage: Secondary | ICD-10-CM | POA: Diagnosis present

## 2016-01-14 DIAGNOSIS — J441 Chronic obstructive pulmonary disease with (acute) exacerbation: Secondary | ICD-10-CM | POA: Diagnosis not present

## 2016-01-14 DIAGNOSIS — R509 Fever, unspecified: Secondary | ICD-10-CM | POA: Diagnosis not present

## 2016-01-14 DIAGNOSIS — Z833 Family history of diabetes mellitus: Secondary | ICD-10-CM

## 2016-01-14 DIAGNOSIS — R0602 Shortness of breath: Secondary | ICD-10-CM | POA: Diagnosis not present

## 2016-01-14 DIAGNOSIS — Z720 Tobacco use: Secondary | ICD-10-CM

## 2016-01-14 DIAGNOSIS — D649 Anemia, unspecified: Secondary | ICD-10-CM | POA: Diagnosis present

## 2016-01-14 DIAGNOSIS — F17210 Nicotine dependence, cigarettes, uncomplicated: Secondary | ICD-10-CM | POA: Diagnosis present

## 2016-01-14 DIAGNOSIS — I480 Paroxysmal atrial fibrillation: Secondary | ICD-10-CM | POA: Diagnosis present

## 2016-01-14 DIAGNOSIS — I10 Essential (primary) hypertension: Secondary | ICD-10-CM | POA: Diagnosis not present

## 2016-01-14 DIAGNOSIS — E785 Hyperlipidemia, unspecified: Secondary | ICD-10-CM | POA: Diagnosis not present

## 2016-01-14 DIAGNOSIS — J9621 Acute and chronic respiratory failure with hypoxia: Secondary | ICD-10-CM | POA: Diagnosis present

## 2016-01-14 DIAGNOSIS — J449 Chronic obstructive pulmonary disease, unspecified: Secondary | ICD-10-CM | POA: Diagnosis not present

## 2016-01-14 DIAGNOSIS — R079 Chest pain, unspecified: Secondary | ICD-10-CM | POA: Diagnosis not present

## 2016-01-14 DIAGNOSIS — J440 Chronic obstructive pulmonary disease with acute lower respiratory infection: Secondary | ICD-10-CM | POA: Diagnosis not present

## 2016-01-14 DIAGNOSIS — Z9981 Dependence on supplemental oxygen: Secondary | ICD-10-CM | POA: Diagnosis not present

## 2016-01-14 DIAGNOSIS — D72829 Elevated white blood cell count, unspecified: Secondary | ICD-10-CM | POA: Diagnosis present

## 2016-01-14 DIAGNOSIS — Z8701 Personal history of pneumonia (recurrent): Secondary | ICD-10-CM | POA: Diagnosis not present

## 2016-01-14 DIAGNOSIS — J9691 Respiratory failure, unspecified with hypoxia: Secondary | ICD-10-CM | POA: Diagnosis not present

## 2016-01-14 DIAGNOSIS — Z8249 Family history of ischemic heart disease and other diseases of the circulatory system: Secondary | ICD-10-CM

## 2016-01-14 DIAGNOSIS — R0600 Dyspnea, unspecified: Secondary | ICD-10-CM | POA: Diagnosis not present

## 2016-01-14 DIAGNOSIS — Z7982 Long term (current) use of aspirin: Secondary | ICD-10-CM

## 2016-01-14 DIAGNOSIS — R05 Cough: Secondary | ICD-10-CM | POA: Diagnosis not present

## 2016-01-14 DIAGNOSIS — E870 Hyperosmolality and hypernatremia: Secondary | ICD-10-CM | POA: Diagnosis not present

## 2016-01-14 LAB — URINALYSIS, ROUTINE W REFLEX MICROSCOPIC
Bilirubin Urine: NEGATIVE
GLUCOSE, UA: NEGATIVE mg/dL
Hgb urine dipstick: NEGATIVE
KETONES UR: NEGATIVE mg/dL
NITRITE: POSITIVE — AB
PH: 6 (ref 5.0–8.0)
PROTEIN: 30 mg/dL — AB
Specific Gravity, Urine: 1.02 (ref 1.005–1.030)

## 2016-01-14 LAB — I-STAT TROPONIN, ED: TROPONIN I, POC: 0.08 ng/mL (ref 0.00–0.08)

## 2016-01-14 LAB — URINE MICROSCOPIC-ADD ON: RBC / HPF: NONE SEEN RBC/hpf (ref 0–5)

## 2016-01-14 LAB — CBC WITH DIFFERENTIAL/PLATELET
BASOS PCT: 0 %
Basophils Absolute: 0 10*3/uL (ref 0.0–0.1)
EOS ABS: 0 10*3/uL (ref 0.0–0.7)
Eosinophils Relative: 0 %
HCT: 37.9 % (ref 36.0–46.0)
Hemoglobin: 11.2 g/dL — ABNORMAL LOW (ref 12.0–15.0)
Lymphocytes Relative: 8 %
Lymphs Abs: 1.6 10*3/uL (ref 0.7–4.0)
MCH: 26.7 pg (ref 26.0–34.0)
MCHC: 29.6 g/dL — AB (ref 30.0–36.0)
MCV: 90.2 fL (ref 78.0–100.0)
MONO ABS: 1.7 10*3/uL — AB (ref 0.1–1.0)
MONOS PCT: 8 %
NEUTROS PCT: 84 %
Neutro Abs: 18 10*3/uL — ABNORMAL HIGH (ref 1.7–7.7)
Platelets: 416 10*3/uL — ABNORMAL HIGH (ref 150–400)
RBC: 4.2 MIL/uL (ref 3.87–5.11)
RDW: 15.4 % (ref 11.5–15.5)
WBC: 21.3 10*3/uL — ABNORMAL HIGH (ref 4.0–10.5)

## 2016-01-14 LAB — STREP PNEUMONIAE URINARY ANTIGEN: STREP PNEUMO URINARY ANTIGEN: NEGATIVE

## 2016-01-14 LAB — BASIC METABOLIC PANEL
Anion gap: 9 (ref 5–15)
BUN: 15 mg/dL (ref 6–20)
CALCIUM: 8.7 mg/dL — AB (ref 8.9–10.3)
CO2: 31 mmol/L (ref 22–32)
Chloride: 103 mmol/L (ref 101–111)
Creatinine, Ser: 0.53 mg/dL (ref 0.44–1.00)
GFR calc non Af Amer: >60 mL/min (ref >60)
Glucose, Bld: 110 mg/dL — ABNORMAL HIGH (ref 65–99)
Potassium: 3.8 mmol/L (ref 3.5–5.1)
SODIUM: 143 mmol/L (ref 135–145)

## 2016-01-14 LAB — I-STAT CG4 LACTIC ACID, ED: Lactic Acid, Venous: 0.69 mmol/L (ref 0.5–2.0)

## 2016-01-14 MED ORDER — ALBUTEROL SULFATE (2.5 MG/3ML) 0.083% IN NEBU
2.5000 mg | INHALATION_SOLUTION | RESPIRATORY_TRACT | Status: DC | PRN
  Administered 2016-01-15: 2.5 mg via RESPIRATORY_TRACT
  Filled 2016-01-14: qty 3

## 2016-01-14 MED ORDER — IPRATROPIUM-ALBUTEROL 0.5-2.5 (3) MG/3ML IN SOLN
3.0000 mL | Freq: Four times a day (QID) | RESPIRATORY_TRACT | Status: DC
  Administered 2016-01-14 – 2016-01-17 (×11): 3 mL via RESPIRATORY_TRACT
  Filled 2016-01-14 (×12): qty 3

## 2016-01-14 MED ORDER — DEXTROSE 5 % IV SOLN
1.0000 g | Freq: Once | INTRAVENOUS | Status: AC
  Administered 2016-01-14: 1 g via INTRAVENOUS
  Filled 2016-01-14: qty 10

## 2016-01-14 MED ORDER — ASPIRIN 325 MG PO TABS
325.0000 mg | ORAL_TABLET | Freq: Every day | ORAL | Status: DC
  Administered 2016-01-14 – 2016-01-18 (×5): 325 mg via ORAL
  Filled 2016-01-14 (×6): qty 1

## 2016-01-14 MED ORDER — SODIUM CHLORIDE 0.9 % IV SOLN
INTRAVENOUS | Status: DC
  Administered 2016-01-14 (×2): via INTRAVENOUS

## 2016-01-14 MED ORDER — OMEGA-3 FATTY ACIDS 1000 MG PO CAPS
1.0000 g | ORAL_CAPSULE | Freq: Two times a day (BID) | ORAL | Status: DC
Start: 2016-01-14 — End: 2016-01-14

## 2016-01-14 MED ORDER — GUAIFENESIN ER 600 MG PO TB12
1200.0000 mg | ORAL_TABLET | Freq: Two times a day (BID) | ORAL | Status: DC
  Administered 2016-01-14 – 2016-01-18 (×8): 1200 mg via ORAL
  Filled 2016-01-14 (×10): qty 2

## 2016-01-14 MED ORDER — ONDANSETRON HCL 4 MG PO TABS: 4.0000 mg | ORAL_TABLET | Freq: Four times a day (QID) | ORAL | Status: DC | PRN

## 2016-01-14 MED ORDER — ONDANSETRON HCL 4 MG/2ML IJ SOLN: 4.0000 mg | Freq: Four times a day (QID) | INTRAMUSCULAR | Status: DC | PRN

## 2016-01-14 MED ORDER — METOPROLOL SUCCINATE ER 25 MG PO TB24
25.0000 mg | ORAL_TABLET | Freq: Every day | ORAL | Status: DC
  Administered 2016-01-14 – 2016-01-17 (×4): 25 mg via ORAL
  Filled 2016-01-14 (×4): qty 1

## 2016-01-14 MED ORDER — DEXTROSE 5 % IV SOLN
500.0000 mg | Freq: Once | INTRAVENOUS | Status: AC
  Administered 2016-01-14: 500 mg via INTRAVENOUS
  Filled 2016-01-14: qty 500

## 2016-01-14 MED ORDER — METHYLPREDNISOLONE SODIUM SUCC 40 MG IJ SOLR
40.0000 mg | Freq: Two times a day (BID) | INTRAMUSCULAR | Status: DC
  Administered 2016-01-14 – 2016-01-16 (×4): 40 mg via INTRAVENOUS
  Filled 2016-01-14 (×4): qty 1

## 2016-01-14 MED ORDER — IPRATROPIUM BROMIDE 0.02 % IN SOLN
0.5000 mg | Freq: Once | RESPIRATORY_TRACT | Status: AC
  Administered 2016-01-14: 0.5 mg via RESPIRATORY_TRACT
  Filled 2016-01-14: qty 2.5

## 2016-01-14 MED ORDER — METHYLPREDNISOLONE SODIUM SUCC 125 MG IJ SOLR
80.0000 mg | Freq: Once | INTRAMUSCULAR | Status: AC
  Administered 2016-01-14: 80 mg via INTRAVENOUS
  Filled 2016-01-14: qty 2

## 2016-01-14 MED ORDER — VERAPAMIL HCL ER 240 MG PO TBCR
240.0000 mg | EXTENDED_RELEASE_TABLET | Freq: Every day | ORAL | Status: DC
  Administered 2016-01-14 – 2016-01-18 (×5): 240 mg via ORAL
  Filled 2016-01-14 (×6): qty 1

## 2016-01-14 MED ORDER — SODIUM CHLORIDE 0.9% FLUSH
3.0000 mL | Freq: Two times a day (BID) | INTRAVENOUS | Status: DC
  Administered 2016-01-14 – 2016-01-18 (×5): 3 mL via INTRAVENOUS

## 2016-01-14 MED ORDER — ENOXAPARIN SODIUM 40 MG/0.4ML ~~LOC~~ SOLN
40.0000 mg | SUBCUTANEOUS | Status: DC
  Administered 2016-01-14 – 2016-01-17 (×4): 40 mg via SUBCUTANEOUS
  Filled 2016-01-14 (×5): qty 0.4

## 2016-01-14 MED ORDER — DEXTROSE 5 % IV SOLN
1.0000 g | INTRAVENOUS | Status: DC
  Administered 2016-01-15 – 2016-01-16 (×2): 1 g via INTRAVENOUS
  Filled 2016-01-14 (×3): qty 10

## 2016-01-14 MED ORDER — DIAZEPAM 5 MG PO TABS: 5.0000 mg | ORAL_TABLET | Freq: Every day | ORAL | Status: DC | PRN

## 2016-01-14 MED ORDER — METOPROLOL SUCCINATE ER 50 MG PO TB24
50.0000 mg | ORAL_TABLET | Freq: Every day | ORAL | Status: DC
  Administered 2016-01-15 – 2016-01-18 (×4): 50 mg via ORAL
  Filled 2016-01-14 (×4): qty 1

## 2016-01-14 MED ORDER — OMEGA-3-ACID ETHYL ESTERS 1 G PO CAPS
1.0000 g | ORAL_CAPSULE | Freq: Two times a day (BID) | ORAL | Status: DC
  Administered 2016-01-14 – 2016-01-18 (×8): 1 g via ORAL
  Filled 2016-01-14 (×9): qty 1

## 2016-01-14 MED ORDER — AZITHROMYCIN 500 MG PO TABS
500.0000 mg | ORAL_TABLET | ORAL | Status: DC
  Administered 2016-01-15 – 2016-01-16 (×2): 500 mg via ORAL
  Filled 2016-01-14 (×3): qty 1

## 2016-01-14 MED ORDER — ALBUTEROL SULFATE (2.5 MG/3ML) 0.083% IN NEBU
5.0000 mg | INHALATION_SOLUTION | Freq: Once | RESPIRATORY_TRACT | Status: AC
  Administered 2016-01-14: 5 mg via RESPIRATORY_TRACT
  Filled 2016-01-14: qty 6

## 2016-01-14 MED ORDER — ACETAMINOPHEN 650 MG RE SUPP: 650.0000 mg | Freq: Four times a day (QID) | RECTAL | Status: DC | PRN

## 2016-01-14 MED ORDER — ATORVASTATIN CALCIUM 40 MG PO TABS
40.0000 mg | ORAL_TABLET | ORAL | Status: DC
  Administered 2016-01-15 – 2016-01-17 (×2): 40 mg via ORAL
  Filled 2016-01-14 (×2): qty 1

## 2016-01-14 MED ORDER — CAPTOPRIL 50 MG PO TABS
50.0000 mg | ORAL_TABLET | Freq: Two times a day (BID) | ORAL | Status: DC
  Administered 2016-01-14 – 2016-01-18 (×8): 50 mg via ORAL
  Filled 2016-01-14 (×10): qty 1

## 2016-01-14 MED ORDER — METOPROLOL SUCCINATE ER 25 MG PO TB24: 25.0000 mg | ORAL_TABLET | Freq: Two times a day (BID) | ORAL | Status: DC

## 2016-01-14 MED ORDER — ACETAMINOPHEN 325 MG PO TABS: 650.0000 mg | ORAL_TABLET | Freq: Four times a day (QID) | ORAL | Status: DC | PRN

## 2016-01-14 MED ORDER — VERAPAMIL HCL 240 MG (CO) PO TB24: 240.0000 mg | ORAL_TABLET | Freq: Every day | ORAL | Status: DC

## 2016-01-14 NOTE — ED Notes (Signed)
Per EMS pt from home, with c/o flu like illness x 2-3 weeks, went ot PCP last week and was diagnosed with flu, pt reports productive cough, fever and generalized body aches and weakness. B/P enroute 160/90, hr 80 97%onm RA. Pt sts didn't take her meds today

## 2016-01-14 NOTE — H&P (Addendum)
History and Physical  [name] [id number] DOB: 1924/03/24 DOA: 01/14/2016  Referring physician: Bernerd Limbo, ED PA-C PCP: Merrilee Seashore, MD  Outpatient Specialists:  1. Cardiology: Dr. Quay Burow  Chief Complaint: Cough and dyspnea  HPI: [name] is a 82 y.o. female , single, lives alone but for the last 3 weeks has been living with her oldest son, ambulates with the help of a walker, PMH of HTN, HLD, COPD on home oxygen 2 L/m at bedtime, ongoing tobacco abuse, PAF, LBBB, recently treated influenza, presented to ED on 01/14/16 with complaints of ongoing cough, dyspnea, poor appetite, unable to sleep and sweats. Patient was in her usual state of health until approximately 3 weeks ago. 2 weeks back she was treated for an episode of UTI with antibiotics. Subsequently she started having complaints of cough and dyspnea and was seen in urgent care on 01/09/16 and treated for flu and completed a course of Tamiflu on 01/13/16. Despite this, she continues to have cough productive of clear sputum, difficulty sleeping at night due to hacking cough, sweats, dyspnea, chest pain only on coughing, poor appetite, nausea and generalized weakness. She denies headache, earache, sore throat, vomiting, abdominal pain, dysuria or urinary frequency. She continues to smoke a pack of cigarettes per day. Due to persisting symptoms, she presented to the ED where low-grade temperature of 25F, oxygen saturation of 78% on room air, WBC 21.3, hemoglobin 11.2 and chest x-ray shows patchy left lower opacity suspicious for atelectasis versus pneumonia. Given her constellation of symptoms, admitting her for possible community-acquired pneumonia. Hospitalist admission was requested.    Review of Systems: All systems reviewed and apart from history of presenting illness, are negative.  Past Medical History  Diagnosis Date  . Paroxysmal atrial fibrillation (HCC)   . Left bundle branch block   .  Hypertension   . Hyperlipidemia   . Chest pain   . Tobacco abuse   . COPD (chronic obstructive pulmonary disease) ([place])   . Personal history of subdural hematoma   . Cataract    Past Surgical History  Procedure Laterality Date  . Eye surgery    . Abdominal hysterectomy    . Appendectomy    . Brain surgery     Social History:  reports that she has been smoking Cigarettes.  She started smoking about 70 years ago. She has a 73 pack-year smoking history. She has never used smokeless tobacco. She reports that she does not drink alcohol or use illicit drugs. Rest as per history of presenting illness.   Allergies  Allergen Reactions  . Cefuroxime Nausea And Vomiting    Family History  Problem Relation Age of Onset  . Diabetes Mother   . Heart disease Mother   . Heart disease Father     Prior to Admission medications   Medication Sig Start Date End Date Taking? Authorizing Provider  acetaminophen (TYLENOL) 500 MG tablet Take 1,000 mg by mouth daily as needed for mild pain.   Yes Historical Provider, MD  albuterol (PROVENTIL HFA;VENTOLIN HFA) 108 (90 BASE) MCG/ACT inhaler Inhale 2 puffs into the lungs every 6 (six) hours as needed.   Yes Historical Provider, MD  albuterol (PROVENTIL) (2.5 MG/3ML) 0.083% nebulizer solution Take 3 mLs (2.5 mg total) by nebulization every 4 (four) hours as needed for wheezing. 04/06/12 01/14/16 Yes Roselee Culver, MD  aspirin 325 MG tablet Take 325 mg by mouth daily.   Yes Historical Provider, MD  atorvastatin (LIPITOR) 40 MG tablet  Take 40 mg by mouth every other day. Every other day   Yes Historical Provider, MD  captopril (CAPOTEN) 50 MG tablet Take 50 mg by mouth 2 (two) times daily.    Yes Historical Provider, MD  diazepam (VALIUM) 5 MG tablet Take 1 tablet by mouth daily as needed for anxiety.  12/12/13  Yes Historical Provider, MD  fish oil-omega-3 fatty acids 1000 MG capsule Take 1 g by mouth 2 (two) times daily.    Yes Historical Provider, MD    Heat Wraps [facility] BACK/HIP) MISC 1 patch by Does not apply route daily as needed (back pain).   Yes Historical Provider, MD  meclizine (ANTIVERT) 12.5 MG tablet Take 1 tablet (12.5 mg total) by mouth 3 (three) times daily. Patient taking differently: Take 12.5 mg by mouth 3 (three) times daily as needed (for veritgo).  07/27/15  Yes Carmin Muskrat, MD  metoprolol succinate (TOPROL-XL) 50 MG 24 hr tablet Take 25-50 mg by mouth 2 (two) times daily. Takes 50mg  in the morning and 25mg  at night..   Yes Historical Provider, MD  OXYGEN Inhale 2 L into the lungs at bedtime. Reported on 12/04/2015   Yes Historical Provider, MD  verapamil (COVERA HS) 240 MG (CO) 24 hr tablet Take 240 mg by mouth daily.    Yes Historical Provider, MD  ipratropium (ATROVENT) 0.02 % nebulizer solution Take 1.25 mLs (250 mcg total) by nebulization 4 (four) times daily. 04/06/12 02/14/14  Roselee Culver, MD   Physical Exam: Filed Vitals:   01/14/16 1230 01/14/16 1235 01/14/16 1509  BP: 173/69  168/62  Pulse: 89  93  Temp: 98.9 F (37.2 C)  99 F (37.2 C)  TempSrc: Oral  Oral  Resp: 24  22  SpO2: 78% 94% 95%     General exam: Moderately built and nourished pleasant elderly female patient, lying propped up in the gurney in no obvious distress but has intermittent wet sounding hacking cough.  Head, eyes and ENT: Nontraumatic and normocephalic. Pupils equally reacting to light and accommodation. Oral mucosa  mildly dry. No oral thrush or acute findings.  Neck: Supple. No JVD, carotid bruit or thyromegaly.  Lymphatics: No lymphadenopathy.  Respiratory system: reduced breath sounds bilaterally with scattered bilateral few medium pitched expiratory rhonchi and few basal crackles. No increased work of breathing. Able to speak in full sentences.   Cardiovascular system: S1 and S2 heard, RRR. No JVD, murmurs, gallops, clicks or pedal edema.  Gastrointestinal system: Abdomen is nondistended, soft and nontender.  Normal bowel sounds heard. No organomegaly or masses appreciated.  Central nervous system: Alert and oriented. No focal neurological deficits.  Extremities: Symmetric 5 x 5 power. Peripheral pulses symmetrically felt.   Skin: No rashes or acute findings.  Musculoskeletal system: Negative exam.  Psychiatry: Pleasant and cooperative.   Labs on Admission:  Basic Metabolic Panel:  Recent Labs Lab 01/14/16 1315  NA 143  K 3.8  CL 103  CO2 31  GLUCOSE 110*  BUN 15  CREATININE 0.53  CALCIUM 8.7*   Liver Function Tests: No results for input(s): AST, ALT, ALKPHOS, BILITOT, PROT, ALBUMIN in the last 168 hours. No results for input(s): LIPASE, AMYLASE in the last 168 hours. No results for input(s): AMMONIA in the last 168 hours. CBC:  Recent Labs Lab 01/14/16 1315  WBC 21.3*  NEUTROABS 18.0*  HGB 11.2*  HCT 37.9  MCV 90.2  PLT 416*   Cardiac Enzymes: No results for input(s): CKTOTAL, CKMB, CKMBINDEX, TROPONINI in the last  168 hours.  BNP (last 3 results) No results for input(s): PROBNP in the last 8760 hours. CBG: No results for input(s): GLUCAP in the last 168 hours.  Radiological Exams on Admission: Dg Chest 2 View  01/14/2016  CLINICAL DATA:  Cough, shortness of breath, nausea.  Flu. EXAM: CHEST  2 VIEW COMPARISON:  07/27/2015 FINDINGS: Increased interstitial markings, some of which may be chronic, although mild interstitial edema is possible. Patchy left lower lobe opacity, possibly atelectasis, pneumonia not excluded. Suspected small left pleural effusion. Cardiomegaly. IMPRESSION: Cardiomegaly with possible mild interstitial edema. Patchy left lower lobe opacity, atelectasis versus pneumonia. Suspected small left pleural effusion. Electronically Signed   By: Julian Hy M.D.   On: 01/14/2016 13:34    EKG: Independently reviewed. Sinus rhythm and LBBB morphology.  Assessment/Plan Principal Problem:   CAP (community acquired pneumonia) Active Problems:    Paroxysmal atrial fibrillation (HCC)   Tobacco abuse   Hyperlipidemia   Essential hypertension   COPD exacerbation (HCC)   Acute on chronic respiratory failure with hypoxia (HCC)   Leukocytosis   Community acquired pneumonia   Left lower lobe community acquired pneumonia - Likely postviral secondary bacterial infection. - Recently treated for influenza A and completed a course of Tamiflu on 3/25. - Check blood culture, sputum culture, urine streptococcal antigen - Started on IV Rocephin and azithromycin, continue same. - Aggressive pulmonary toilet.  COPD exacerbation - Precipitated by pneumonia, recent influenza A URTI complicating underlying COPD and ongoing tobacco abuse. - Tobacco cessation counseled. - Continue treatment for pneumonia as above, brief IV Solu-Medrol, bronchodilator nebulizations, oxygen, incentive spirometry, flutter valve and aggressive pulmonary toilet.  Tobacco abuse - Cessation counseled - Patient declines nicotine patch.  Acute on chronic hypoxic respiratory failure - Precipitated by pneumonia and recent viral URTI. - Oxygen saturation 78% on room air on arrival. - Oxygen supplementation and rest of management as above. - At baseline patient on 2 L oxygen via nasal cannula at bedtime.  Essential hypertension - Mildly uncontrolled. Patient has not taken her home medications today-resume.  Paroxysmal atrial fibrillation - Continue verapamil and aspirin. Likely not a candidate for anticoagulation due to advanced age and fall risk.  Leukocytosis - Follow CBCs.  Hyperlipidemia - Continue statins.  Anemia - Hemoglobin probably at baseline. Follow CBC.    DVT Prophylaxis: Lovenox Code Status: Full  Family Communication: discussed at length with patient's son at bedside Mr. Massie Bougie on 3/26. Updated care and answered questions.  Disposition Plan: DC home when medically stable, possibly in the next 3-4 days.   Time spent: 70  minutes.  Vernell Leep, MD, FACP, FHM. [facility] Pager [phone]  If 7PM-7AM, please contact night-coverage [email or web address] Password [email or web address] 01/14/2016, 4:20 PM

## 2016-01-14 NOTE — ED Provider Notes (Signed)
CSN: [id number]     Arrival date & time 01/14/16  1159 History   First MD Initiated Contact with Patient 01/14/16 1209     Chief Complaint  Patient presents with  . Influenza    HPI   [name] is a 82 y.o. female with a PMH of HTN, HLD, COPD, paroxysmal atrial fibrillation who presents to the ED with subjective fever, productive cough, generalized weakness, and body aches. She states her symptoms started about 3 weeks ago, and notes she was diagnosed with the flu by her PCP 2 weeks ago and treated with tamiflu at that time. She reports she was also told she had a UTI, and was treated with an antibiotic, though she cannot remember which one. She states she has not had any improvement in her symptoms, and notes her cough has become worse. She denies exacerbating factors. She reports she has tried OTC cough and cold medications with no significant symptom relief. She reports associated chest tightness and shortness of breath, which she attributes to her history of asthma. She denies abdominal pain, N/V/D/C, dysuria, urgency, frequency.   Past Medical History  Diagnosis Date  . Paroxysmal atrial fibrillation (HCC)   . Left bundle branch block   . Hypertension   . Hyperlipidemia   . Chest pain   . Tobacco abuse   . COPD (chronic obstructive pulmonary disease) ([place])   . Personal history of subdural hematoma   . Cataract    Past Surgical History  Procedure Laterality Date  . Eye surgery    . Abdominal hysterectomy    . Appendectomy    . Brain surgery     Family History  Problem Relation Age of Onset  . Diabetes Mother   . Heart disease Mother   . Heart disease Father    Social History  Substance Use Topics  . Smoking status: Current Every Day Smoker -- 1.00 packs/day for 73 years    Types: Cigarettes    Start date: 07/30/1945  . Smokeless tobacco: Never Used  . Alcohol Use: No   OB History    No data available       Review of Systems  Constitutional: Positive for  fever and fatigue. Negative for chills.  HENT: Positive for congestion. Negative for ear pain and sore throat.   Respiratory: Positive for cough and shortness of breath.   Cardiovascular: Positive for chest pain.  Gastrointestinal: Negative for nausea, vomiting, abdominal pain, diarrhea and constipation.  Musculoskeletal: Positive for myalgias.  Neurological: Positive for weakness.  All other systems reviewed and are negative.     Allergies  Cefuroxime  Home Medications   Prior to Admission medications   Medication Sig Start Date End Date Taking? Authorizing Provider  acetaminophen (TYLENOL) 500 MG tablet Take 1,000 mg by mouth daily as needed for mild pain.    Historical Provider, MD  albuterol (PROVENTIL HFA;VENTOLIN HFA) 108 (90 BASE) MCG/ACT inhaler Inhale 2 puffs into the lungs every 6 (six) hours as needed.    Historical Provider, MD  albuterol (PROVENTIL) (2.5 MG/3ML) 0.083% nebulizer solution Take 3 mLs (2.5 mg total) by nebulization every 4 (four) hours as needed for wheezing. 04/06/12 07/27/15  Roselee Culver, MD  aspirin 325 MG tablet Take 325 mg by mouth daily.    Historical Provider, MD  atorvastatin (LIPITOR) 40 MG tablet Every other day    Historical Provider, MD  captopril (CAPOTEN) 50 MG tablet Take 50 mg by mouth 2 (two) times daily.  Historical Provider, MD  diazepam (VALIUM) 5 MG tablet Take 1 tablet by mouth daily as needed for anxiety.  12/12/13   Historical Provider, MD  diphenhydrAMINE (BENADRYL) 25 MG tablet Take 25 mg by mouth every 6 (six) hours as needed for allergies. Reported on 12/04/2015    Historical Provider, MD  fish oil-omega-3 fatty acids 1000 MG capsule Take 1 g by mouth 2 (two) times daily.     Historical Provider, MD  Heat Wraps [facility] BACK/HIP) MISC 1 patch by Does not apply route daily as needed (back pain).    Historical Provider, MD  ipratropium (ATROVENT) 0.02 % nebulizer solution Take 1.25 mLs (250 mcg total) by nebulization 4 (four)  times daily. 04/06/12 02/14/14  Roselee Culver, MD  meclizine (ANTIVERT) 12.5 MG tablet Take 1 tablet (12.5 mg total) by mouth 3 (three) times daily. 07/27/15   Carmin Muskrat, MD  metoprolol succinate (TOPROL-XL) 50 MG 24 hr tablet Take 50 mg by mouth daily. 25 mg at night..    Historical Provider, MD  nitrofurantoin, macrocrystal-monohydrate, (MACROBID) 100 MG capsule Take 1 capsule (100 mg total) by mouth 2 (two) times daily. 12/26/15   Elby Beck, FNP  oseltamivir (TAMIFLU) 75 MG capsule Take 1 capsule (75 mg total) by mouth 2 (two) times daily. 01/09/16   Robyn Haber, MD  oxybutynin (DITROPAN XL) 5 MG 24 hr tablet Take 1 tablet (5 mg total) by mouth at bedtime. 07/13/15   Robyn Haber, MD  OXYGEN Inhale 2 L into the lungs at bedtime. Reported on 12/04/2015    Historical Provider, MD  verapamil (COVERA HS) 240 MG (CO) 24 hr tablet Take 240 mg by mouth daily.     Historical Provider, MD    BP 173/69 mmHg  Pulse 89  Temp(Src) 98.9 F (37.2 C) (Oral)  Resp 24  SpO2 94% Physical Exam  Constitutional: She is oriented to person, place, and time. She appears well-developed and well-nourished. No distress.  HENT:  Head: Normocephalic and atraumatic.  Right Ear: External ear normal.  Left Ear: External ear normal.  Nose: Nose normal.  Mouth/Throat: Uvula is midline, oropharynx is clear and moist and mucous membranes are normal.  Eyes: Conjunctivae, EOM and lids are normal. Pupils are equal, round, and reactive to light. Right eye exhibits no discharge. Left eye exhibits no discharge. No scleral icterus.  Neck: Normal range of motion. Neck supple.  Cardiovascular: Normal rate, regular rhythm, normal heart sounds, intact distal pulses and normal pulses.   Pulmonary/Chest: Effort normal and breath sounds normal. No respiratory distress. She has no wheezes. She has no rales.  Rhonchi to lung fields bilaterally. End expiratory wheezing to lung fields bilaterally.  Abdominal: Soft.  Normal appearance and bowel sounds are normal. She exhibits no distension and no mass. There is no tenderness. There is no rigidity, no rebound and no guarding.  Musculoskeletal: Normal range of motion. She exhibits no edema or tenderness.  Neurological: She is alert and oriented to person, place, and time. She has normal strength. No cranial nerve deficit or sensory deficit.  Skin: Skin is warm, dry and intact. No rash noted. She is not diaphoretic. No erythema. No pallor.  Psychiatric: She has a normal mood and affect. Her speech is normal and behavior is normal.  Nursing note and vitals reviewed.   ED Course  Procedures (including critical care time)  Labs Review Labs Reviewed  CBC WITH DIFFERENTIAL/PLATELET - Abnormal; Notable for the following:    WBC 21.3 (*)  Hemoglobin 11.2 (*)    MCHC 29.6 (*)    Platelets 416 (*)    Neutro Abs 18.0 (*)    Monocytes Absolute 1.7 (*)    All other components within normal limits  BASIC METABOLIC PANEL - Abnormal; Notable for the following:    Glucose, Bld 110 (*)    Calcium 8.7 (*)    All other components within normal limits  URINALYSIS, ROUTINE W REFLEX MICROSCOPIC (NOT AT [facility])  I-STAT TROPOININ, ED  I-STAT CG4 LACTIC ACID, ED    Imaging Review Dg Chest 2 View  01/14/2016  CLINICAL DATA:  Cough, shortness of breath, nausea.  Flu. EXAM: CHEST  2 VIEW COMPARISON:  07/27/2015 FINDINGS: Increased interstitial markings, some of which may be chronic, although mild interstitial edema is possible. Patchy left lower lobe opacity, possibly atelectasis, pneumonia not excluded. Suspected small left pleural effusion. Cardiomegaly. IMPRESSION: Cardiomegaly with possible mild interstitial edema. Patchy left lower lobe opacity, atelectasis versus pneumonia. Suspected small left pleural effusion. Electronically Signed   By: Julian Hy M.D.   On: 01/14/2016 13:34   I have personally reviewed and evaluated these images and lab results as part of my  medical decision-making.   EKG Interpretation   Date/Time:  Sunday January 14 2016 12:46:00 EDT Ventricular Rate:  92 PR Interval:  177 QRS Duration: 131 QT Interval:  410 QTC Calculation: 507 R Axis:   -14 Text Interpretation:  Sinus rhythm Left bundle branch block No significant  change since last tracing Confirmed by Ashok Cordia  MD, Lennette Bihari ([id number]) on  01/14/2016 12:55:22 PM      MDM   Final diagnoses:  CAP (community acquired pneumonia)    82 year old female presents with subjective fever, productive cough, generalized weakness, and body aches x 3 weeks. Notes she was diagnosed with the flu by her PCP 2 weeks ago and treated with tamiflu. Reports associated chest tightness and shortness of breath, which she attributes to her history of asthma. Denies abdominal pain, N/V/D/C, dysuria, urgency, frequency.  Patient is afebrile. Oxygen saturation initially 70s on room air, patient placed on 3 L O2 [place] with subsequent improvement in oxygen to mid 90s. Lungs with diffuse rhonchi and wheezing bilaterally. Patient given breathing treatment.  CBC remarkable for leukocytosis of 21.3, hemoglobin 11.2. BMP unremarkable. Lactic acid within normal limits. EKG sinus rhythm, left bundle branch block, no significant change since last tracing. Troponin negative. Chest x-ray remarkable for patchy left lower lobe opacity, atelectasis versus pneumonia. Patient started on CAP antibiotics.  Patient discussed with and seen by Dr. Ashok Cordia. Hospitalist consulted for admission given CAP in the setting of new oxygen requirement. Spoke with Dr. Algis Liming, patient to be admitted for further evaluation and management.  BP 173/69 mmHg  Pulse 89  Temp(Src) 98.9 F (37.2 C) (Oral)  Resp 24  SpO2 94%   Marella Chimes, PA-C 01/14/16 [place], MD 01/16/16 1144

## 2016-01-14 NOTE — Progress Notes (Signed)
CPT not started at this time due to no flutter device available at this time.  Rt will start therapy tomorrow.  Rt will monitor.

## 2016-01-14 NOTE — Progress Notes (Signed)
MD notified about BP 177/72. New orders received to give metoprolol and catopril. . Instructed watch watch patient's respiratory status closely. Will continue to monitor [employer]. Brigitte Pulse, RN

## 2016-01-14 NOTE — ED Notes (Signed)
Per Hospitalist, Pt will be changed to a Tele bed.

## 2016-01-15 DIAGNOSIS — L8990 Pressure ulcer of unspecified site, unspecified stage: Secondary | ICD-10-CM | POA: Insufficient documentation

## 2016-01-15 LAB — HIV ANTIBODY (ROUTINE TESTING W REFLEX): HIV Screen 4th Generation wRfx: NONREACTIVE

## 2016-01-15 LAB — CBC
HEMATOCRIT: 36.3 % (ref 36.0–46.0)
HEMOGLOBIN: 10.8 g/dL — AB (ref 12.0–15.0)
MCH: 27.1 pg (ref 26.0–34.0)
MCHC: 29.8 g/dL — AB (ref 30.0–36.0)
MCV: 91.2 fL (ref 78.0–100.0)
Platelets: 383 10*3/uL (ref 150–400)
RBC: 3.98 MIL/uL (ref 3.87–5.11)
RDW: 15.6 % — ABNORMAL HIGH (ref 11.5–15.5)
WBC: 19.3 10*3/uL — ABNORMAL HIGH (ref 4.0–10.5)

## 2016-01-15 LAB — BASIC METABOLIC PANEL
Anion gap: 8 (ref 5–15)
BUN: 18 mg/dL (ref 6–20)
CHLORIDE: 106 mmol/L (ref 101–111)
CO2: 32 mmol/L (ref 22–32)
Calcium: 8.8 mg/dL — ABNORMAL LOW (ref 8.9–10.3)
Creatinine, Ser: 0.48 mg/dL (ref 0.44–1.00)
GLUCOSE: 149 mg/dL — AB (ref 65–99)
Potassium: 4 mmol/L (ref 3.5–5.1)
SODIUM: 146 mmol/L — AB (ref 135–145)

## 2016-01-15 NOTE — Discharge Summary (Deleted)
PROGRESS NOTE    [name]  [id number]  DOB: January 20, 1924  DOA: 01/14/2016 PCP: Merrilee Seashore, MD Outpatient Specialists: Outpatient Specialists:  1. Cardiology: Dr. Lance Coon course: 82 y.o. female , single, lives alone but for the last 3 weeks has been living with her oldest son, ambulates with the help of a walker, PMH of HTN, HLD, COPD on home oxygen 2 L/m at bedtime, ongoing tobacco abuse, PAF, LBBB, recently treated influenza, presented to ED on 01/14/16 with complaints of ongoing cough, dyspnea, poor appetite, unable to sleep and sweats. Over the last 2 weeks, treated for UTI followed by influenza and completed course of Tamiflu 01/13/16. Admitted for possible community-acquired pneumonia.   Assessment & Plan:   Left lower lobe community acquired pneumonia - Likely postviral secondary bacterial infection. - Recently treated for influenza A and completed a course of Tamiflu on 3/25. - Check blood culture (negative to date), sputum culture-never sent, urine streptococcal antigen: Negative. HIV antibody: Nonreactive - Started on IV Rocephin and azithromycin, continue same. - Aggressive pulmonary toilet. Seems quite congested. - Continue management.  COPD exacerbation - Precipitated by pneumonia, recent influenza A URTI complicating underlying COPD and ongoing tobacco abuse. - Tobacco cessation counseled. - Continue treatment for pneumonia as above, brief IV Solu-Medrol, bronchodilator nebulizations, oxygen, incentive spirometry, flutter valve and aggressive pulmonary toilet. - Seems slightly better. Continue management.  Tobacco abuse - Cessation counseled - Patient declines nicotine patch.  Acute on chronic hypoxic respiratory failure - Precipitated by pneumonia and recent viral URTI. - Oxygen saturation 78% on room air on arrival. - Oxygen supplementation and rest of management as above. - At baseline patient on 2 L oxygen via nasal cannula at  bedtime.  Essential hypertension - Continue home medications. Reasonable inpatient control.  Paroxysmal atrial fibrillation - Continue verapamil and aspirin. Likely not a candidate for anticoagulation due to advanced age and fall risk. - Stable.  Leukocytosis - Improving.   Hyperlipidemia - Continue statins.  Anemia - Hemoglobin probably at baseline. Stable.   DVT Prophylaxis: Lovenox Code Status: Full  Family Communication: None at bedside.  Disposition Plan: DC home when medically stable, possibly in the next 3-4 days.    Consultants:  None  Procedures:  None  Antimicrobials:  IV Rocephin 3/26>  Azithromycin 3/26>   Subjective: Feels better. Improved dyspnea and cough. Denies any other complaints.  Objective: Filed Vitals:   01/15/16 0600 01/15/16 0824 01/15/16 1312 01/15/16 1533  BP: 145/56  120/86   Pulse: 70  71   Temp: 97.8 F (36.6 C)  98 F (36.7 C)   TempSrc: Oral  Oral   Resp: 22  22   Height:      Weight: 69.1 kg (152 lb 5.4 oz)     SpO2: 94% 94% 94% 90%    Intake/Output Summary (Last 24 hours) at 01/15/16 1802 Last data filed at 01/15/16 1635  Gross per 24 hour  Intake    236 ml  Output    775 ml  Net   -539 ml   Filed Weights   01/14/16 2023 01/15/16 0600  Weight: 69.7 kg (153 lb 10.6 oz) 69.1 kg (152 lb 5.4 oz)    Exam:  General exam: Pleasant elderly female, moderately built and frail, lying comfortably propped up in bed with congested sounding intermittent cough. Respiratory system: Improved breath sounds compared to yesterday but still harsh with occasional rhonchi. No increased work of breathing. Cardiovascular system: S1 & S2 heard, RRR. No JVD,  murmurs, gallops, clicks or pedal edema. Telemetry: PAF/SR Gastrointestinal system: Abdomen is nondistended, soft and nontender. Normal bowel sounds heard. Central nervous system: Alert and oriented to self and place. No focal neurological deficits. Extremities: Symmetric 5 x 5  power.   Data Reviewed: Basic Metabolic Panel:  Recent Labs Lab 01/14/16 1315 01/15/16 0542  NA 143 146*  K 3.8 4.0  CL 103 106  CO2 31 32  GLUCOSE 110* 149*  BUN 15 18  CREATININE 0.53 0.48  CALCIUM 8.7* 8.8*   Liver Function Tests: No results for input(s): AST, ALT, ALKPHOS, BILITOT, PROT, ALBUMIN in the last 168 hours. No results for input(s): LIPASE, AMYLASE in the last 168 hours. No results for input(s): AMMONIA in the last 168 hours. CBC:  Recent Labs Lab 01/14/16 1315 01/15/16 0542  WBC 21.3* 19.3*  NEUTROABS 18.0*  --   HGB 11.2* 10.8*  HCT 37.9 36.3  MCV 90.2 91.2  PLT 416* 383   Cardiac Enzymes: No results for input(s): CKTOTAL, CKMB, CKMBINDEX, TROPONINI in the last 168 hours. BNP (last 3 results) No results for input(s): PROBNP in the last 8760 hours. CBG: No results for input(s): GLUCAP in the last 168 hours.  Recent Results (from the past 240 hour(s))  Culture, blood (routine x 2) Call MD if unable to obtain prior to antibiotics being given     Status: None (Preliminary result)   Collection Time: 01/14/16  7:14 PM  Result Value Ref Range Status   Specimen Description BLOOD RIGHT ANTECUBITAL  Final   Special Requests BOTTLES DRAWN AEROBIC AND ANAEROBIC 5ML EA  Final   Culture   Final    NO GROWTH < 12 HOURS Performed at [facility]    Report Status PENDING  Incomplete  Culture, blood (routine x 2) Call MD if unable to obtain prior to antibiotics being given     Status: None (Preliminary result)   Collection Time: 01/14/16  7:14 PM  Result Value Ref Range Status   Specimen Description BLOOD RIGHT ARM  Final   Special Requests BOTTLES DRAWN AEROBIC ONLY 5 ML  Final   Culture   Final    NO GROWTH < 12 HOURS Performed at [facility]    Report Status PENDING  Incomplete         Studies: Dg Chest 2 View  01/14/2016  CLINICAL DATA:  Cough, shortness of breath, nausea.  Flu. EXAM: CHEST  2 VIEW COMPARISON:  07/27/2015  FINDINGS: Increased interstitial markings, some of which may be chronic, although mild interstitial edema is possible. Patchy left lower lobe opacity, possibly atelectasis, pneumonia not excluded. Suspected small left pleural effusion. Cardiomegaly. IMPRESSION: Cardiomegaly with possible mild interstitial edema. Patchy left lower lobe opacity, atelectasis versus pneumonia. Suspected small left pleural effusion. Electronically Signed   By: Julian Hy M.D.   On: 01/14/2016 13:34        Scheduled Meds: . aspirin  325 mg Oral Daily  . atorvastatin  40 mg Oral QODAY  . azithromycin  500 mg Oral Q24H  . captopril  50 mg Oral BID  . cefTRIAXone (ROCEPHIN)  IV  1 g Intravenous Q24H  . enoxaparin (LOVENOX) injection  40 mg Subcutaneous Q24H  . guaiFENesin  1,200 mg Oral BID  . ipratropium-albuterol  3 mL Nebulization Q6H  . methylPREDNISolone (SOLU-MEDROL) injection  40 mg Intravenous Q12H  . metoprolol succinate  25 mg Oral QHS  . metoprolol succinate  50 mg Oral Daily  . omega-3 acid ethyl  esters  1 g Oral BID  . sodium chloride flush  3 mL Intravenous Q12H  . verapamil  240 mg Oral Daily   Continuous Infusions:    Principal Problem:   CAP (community acquired pneumonia) Active Problems:   Paroxysmal atrial fibrillation (HCC)   Tobacco abuse   Hyperlipidemia   Essential hypertension   COPD exacerbation (HCC)   Acute on chronic respiratory failure with hypoxia (HCC)   Leukocytosis   Community acquired pneumonia   Pressure ulcer    Time spent: 25 minutes.    Vernell Leep, MD, FACP, FHM. [facility] Pager [phone]  If 7PM-7AM, please contact night-coverage [email or web address] Password [email or web address] 01/15/2016, 6:02 PM    LOS: 1 day

## 2016-01-15 NOTE — Care Management Note (Signed)
Case Management Note  Patient Details  Name: [name] MRN: [id number] Date of Birth: 10-06-1924  Subjective/Objective:   82 y/o f admitted w/ATX vs PNA,COPD. From home.PT cons-await recc.                 Action/Plan:d/c plan home.   Expected Discharge Date:                  Expected Discharge Plan:  [place]  In-House Referral:     Discharge planning Services  CM Consult  Post Acute Care Choice:    Choice offered to:     DME Arranged:    DME Agency:     HH Arranged:    [place] Agency:     Status of Service:  In process, will continue to follow  Medicare Important Message Given:    Date Medicare IM Given:    Medicare IM give by:    Date Additional Medicare IM Given:    Additional Medicare Important Message give by:     If discussed at [place] of Stay Meetings, dates discussed:    Additional Comments:  Dessa Phi, RN 01/15/2016, 1:27 PM

## 2016-01-15 NOTE — Evaluation (Addendum)
Physical Therapy Evaluation Patient Details Name: [name] MRN: [id number] DOB: [birth year] Today's Date: 01/15/2016   History of Present Illness  82 yo female admitted with CAP. hx of HTN, COPD, PAfib, OA  Clinical Impression  On eval, pt required Min assist for mobility-walked ~75 feet with RW. O2 sats 91% on 3L at rest and during ambulation. Will follow and progress activity as tolerated. Recommend HHPT as long as family can provide assistance. If 24 hour care not available, recommend ST rehab at SNF    Follow Up Recommendations Home health PT;Supervision/Assistance - 24 hour (SNF if pt will not have assistance at home)    Equipment Recommendations  Rolling walker with 5" wheels    Recommendations for Other Services       Precautions / Restrictions Precautions Precautions: Fall Precaution Comments: monitor sats Restrictions Weight Bearing Restrictions: No      Mobility  Bed Mobility Overal bed mobility: Needs Assistance Bed Mobility: Supine to Sit;Sit to Supine     Supine to sit: Min guard Sit to supine: Min guard   General bed mobility comments: close guard for safety  Transfers Overall transfer level: Needs assistance Equipment used: Rolling walker (2 wheeled) Transfers: Sit to/from Stand Sit to Stand: Min assist Stand pivot transfers: Mod assist       General transfer comment: Assist to rise, stabilize, control descent. VCs safety, hand placement.   Ambulation/Gait Ambulation/Gait assistance: Min assist Ambulation Distance (Feet): 75 Feet Assistive device: Rolling walker (2 wheeled) Gait Pattern/deviations: Step-through pattern;Decreased stride length;Trunk flexed     General Gait Details: intermittent assist to stabilize. Remained on 3L [place] O2-sats 91%. Pt tolerated distance fairly well.  Stairs            Wheelchair Mobility    Modified Rankin (Stroke Patients Only)       Balance                                              Pertinent Vitals/Pain Pain Assessment: No/denies pain    Home Living Family/patient expects to be discharged to:: Private residence Living Arrangements: Children Available Help at Discharge: Family Type of Home: House Home Access: Stairs to enter   Technical brewer of Steps: 2 Home Layout: One level Home Equipment: None Additional Comments: pt reports only a broken walker    Prior Function Level of Independence: Independent with assistive device(s)         Comments: uses RW but pt states it isn't in good condition     Hand Dominance        Extremity/Trunk Assessment   Upper Extremity Assessment: Defer to OT evaluation           Lower Extremity Assessment: Generalized weakness      Cervical / Trunk Assessment: Kyphotic  Communication   Communication: HOH  Cognition Arousal/Alertness: Awake/alert Behavior During Therapy: WFL for tasks assessed/performed Overall Cognitive Status: Within Functional Limits for tasks assessed                      General Comments      Exercises        Assessment/Plan    PT Assessment Patient needs continued PT services  PT Diagnosis Difficulty walking;Generalized weakness   PT Problem List Decreased strength;Decreased activity tolerance;Decreased balance;Decreased mobility  PT Treatment Interventions DME instruction;Gait training;Functional mobility training;Therapeutic activities;Patient/family education;Balance  training;Therapeutic exercise   PT Goals (Current goals can be found in the Care Plan section) Acute Rehab PT Goals Patient Stated Goal: to get better PT Goal Formulation: With patient Time For Goal Achievement: 01/29/16 Potential to Achieve Goals: Good    Frequency Min 3X/week   Barriers to discharge        Co-evaluation               End of Session Equipment Utilized During Treatment: Gait belt Activity Tolerance: Patient tolerated treatment well Patient left: in  bed;with call bell/phone within reach;with bed alarm set           Time: 1440-1456 PT Time Calculation (min) (ACUTE ONLY): 16 min   Charges:   PT Evaluation $PT Eval Low Complexity: 1 Procedure     PT G Codes:        Weston Anna, MPT Pager: [phone]

## 2016-01-15 NOTE — Evaluation (Signed)
Occupational Therapy Evaluation Patient Details Name: [name] MRN: [id number] DOB: 22-Sep-1924 Today's Date: 01/15/2016    History of Present Illness 82 yo female admitted with CAP. hx of HTN, COPD, PAfib, OA   Clinical Impression   Pt admitted with CAP. Pt currently with functional limitations due to the deficits listed below (see OT Problem List).  Pt will benefit from skilled OT to increase their safety and independence with ADL and functional mobility for ADL to facilitate discharge to venue listed below.    Follow Up Recommendations  SNF    Equipment Recommendations  None recommended by OT    Recommendations for Other Services       Precautions / Restrictions Precautions Precautions: Fall Precaution Comments: monitor sats Restrictions Weight Bearing Restrictions: No      Mobility Bed Mobility Overal bed mobility: Needs Assistance Bed Mobility: Supine to Sit     Supine to sit: Min assist        Transfers Overall transfer level: Needs assistance Equipment used: 1 person hand held assist Transfers: Sit to/from [employer] Transfers Sit to Stand: Mod assist Stand pivot transfers: Mod assist                 ADL Overall ADL's : Needs assistance/impaired     Grooming: Set up;Sitting               Lower Body Dressing: Maximal assistance;Sit to/from stand;Cueing for sequencing;Cueing for safety   Toilet Transfer: BSC;Moderate assistance;Stand-pivot;Squat-pivot   Toileting- Clothing Manipulation and Hygiene: Moderate assistance;Sit to/from stand;Cueing for sequencing;Cueing for safety         General ADL Comments: pt needed significant A to get to [facility] and back. Pt states her son not able to help with ADL 's               Pertinent Vitals/Pain Pain Assessment: No/denies pain     Hand Dominance     Extremity/Trunk Assessment Upper Extremity Assessment Upper Extremity Assessment: Generalized weakness            Communication Communication Communication: No difficulties   Cognition Arousal/Alertness: Awake/alert Behavior During Therapy: WFL for tasks assessed/performed Overall Cognitive Status: Within Functional Limits for tasks assessed                                Home Living Family/patient expects to be discharged to:: Private residence Living Arrangements: Children Available Help at Discharge: Family Type of Home: House Home Access: Stairs to enter Technical brewer of Steps: 2   Home Layout: One level               Home Equipment: None   Additional Comments: pt reports only a broken walker      Prior Functioning/Environment Level of Independence: Independent with assistive device(s)        Comments: uses RW but pt states it isn't in good condition    OT Diagnosis: Generalized weakness   OT Problem List: Decreased strength;Decreased activity tolerance;Decreased safety awareness   OT Treatment/Interventions: Self-care/ADL training;DME and/or AE instruction;Patient/family education    OT Goals(Current goals can be found in the care plan section) Acute Rehab OT Goals Patient Stated Goal: get stronger OT Goal Formulation: With patient Time For Goal Achievement: 01/22/16 ADL Goals Pt Will Perform Grooming: with modified independence;standing Pt Will Perform Lower Body Dressing: with modified independence;sit to/from stand Pt Will Transfer to Toilet: with modified independence;regular height toilet Pt  Will Perform Toileting - Clothing Manipulation and hygiene: with modified independence;sit to/from stand  OT Frequency: Min 2X/week   Barriers to D/C:               End of Session Nurse Communication: Mobility status  Activity Tolerance: Patient tolerated treatment well Patient left: in bed;with bed alarm set;with call bell/phone within reach   Time: 1600-1625 OT Time Calculation (min): 25 min Charges:  OT General Charges $OT Visit: 1  Procedure OT Evaluation $OT Eval Low Complexity: 1 Procedure OT Treatments $Self Care/Home Management : 8-22 mins G-Codes:    Betsy Pries 2016-02-05, 4:34 PM

## 2016-01-16 DIAGNOSIS — E870 Hyperosmolality and hypernatremia: Secondary | ICD-10-CM

## 2016-01-16 LAB — BASIC METABOLIC PANEL
ANION GAP: 8 (ref 5–15)
BUN: 26 mg/dL — AB (ref 6–20)
CO2: 32 mmol/L (ref 22–32)
Calcium: 8.9 mg/dL (ref 8.9–10.3)
Chloride: 106 mmol/L (ref 101–111)
Creatinine, Ser: 0.49 mg/dL (ref 0.44–1.00)
Glucose, Bld: 156 mg/dL — ABNORMAL HIGH (ref 65–99)
POTASSIUM: 4.4 mmol/L (ref 3.5–5.1)
SODIUM: 146 mmol/L — AB (ref 135–145)

## 2016-01-16 LAB — CBC
HEMATOCRIT: 35.2 % — AB (ref 36.0–46.0)
Hemoglobin: 10.4 g/dL — ABNORMAL LOW (ref 12.0–15.0)
MCH: 26.9 pg (ref 26.0–34.0)
MCHC: 29.5 g/dL — ABNORMAL LOW (ref 30.0–36.0)
MCV: 91 fL (ref 78.0–100.0)
Platelets: 441 10*3/uL — ABNORMAL HIGH (ref 150–400)
RBC: 3.87 MIL/uL (ref 3.87–5.11)
RDW: 15.8 % — AB (ref 11.5–15.5)
WBC: 18.2 10*3/uL — AB (ref 4.0–10.5)

## 2016-01-16 MED ORDER — PREDNISONE 20 MG PO TABS
30.0000 mg | ORAL_TABLET | Freq: Every day | ORAL | Status: DC
  Administered 2016-01-17 – 2016-01-18 (×2): 30 mg via ORAL
  Filled 2016-01-16 (×2): qty 1

## 2016-01-16 MED ORDER — FREE WATER
200.0000 mL | Status: DC
  Administered 2016-01-16 – 2016-01-18 (×14): 200 mL via ORAL

## 2016-01-16 MED ORDER — DEXTROSE 5 % IV SOLN
INTRAVENOUS | Status: AC
  Administered 2016-01-16: 08:00:00 via INTRAVENOUS

## 2016-01-16 NOTE — Progress Notes (Signed)
PROGRESS NOTE    [name]  [id number]  DOB: [birth year]  DOA: 01/14/2016 PCP: Merrilee Seashore, MD Outpatient Specialists: Outpatient Specialists:  1. Cardiology: Dr. Lance Coon course: 82 y.o. female , single, lives alone but for the last 3 weeks has been living with her oldest son, ambulates with the help of a walker, PMH of HTN, HLD, COPD on home oxygen 2 L/m at bedtime, ongoing tobacco abuse, PAF, LBBB, recently treated influenza, presented to ED on 01/14/16 with complaints of ongoing cough, dyspnea, poor appetite, unable to sleep and sweats. Over the last 2 weeks, treated for UTI followed by influenza and completed course of Tamiflu 01/13/16. Admitted for possible community-acquired pneumonia.   Assessment & Plan:   Left lower lobe community acquired pneumonia - Likely postviral secondary bacterial infection. - Recently treated for influenza A and completed a course of Tamiflu on 3/25. - Check blood culture (negative to date), sputum culture-never sent, urine streptococcal antigen: Negative. HIV antibody: Nonreactive - Started on IV Rocephin and azithromycin, continue same. - Aggressive pulmonary toilet. Seems quite congested. - Continue management.  COPD exacerbation - Precipitated by pneumonia, recent influenza A URTI complicating underlying COPD and ongoing tobacco abuse. - Tobacco cessation counseled. - Continue treatment for pneumonia as above, brief IV Solu-Medrol, bronchodilator nebulizations, oxygen, incentive spirometry, flutter valve and aggressive pulmonary toilet. - Seems slightly better. Continue management.  Tobacco abuse - Cessation counseled - Patient declines nicotine patch.  Acute on chronic hypoxic respiratory failure - Precipitated by pneumonia and recent viral URTI. - Oxygen saturation 78% on room air on arrival. - Oxygen supplementation and rest of management as above. - At baseline patient on 2 L oxygen via nasal cannula at  bedtime.  Essential hypertension - Continue home medications. Reasonable inpatient control.  Paroxysmal atrial fibrillation - Continue verapamil and aspirin. Likely not a candidate for anticoagulation due to advanced age and fall risk. - Stable.  Leukocytosis - Improving.   Hyperlipidemia - Continue statins.  Anemia - Hemoglobin probably at baseline. Stable.   DVT Prophylaxis: Lovenox Code Status: Full  Family Communication: None at bedside.  Disposition Plan: DC home when medically stable, possibly in the next 3-4 days.    Consultants:  None  Procedures:  None  Antimicrobials:  IV Rocephin 3/26>  Azithromycin 3/26>   Subjective: Feels better. Improved dyspnea and cough. Denies any other complaints.  Objective: Filed Vitals:   01/16/16 0157 01/16/16 0550 01/16/16 1404 01/16/16 1423  BP:  169/70  157/63  Pulse:  81  80  Temp:  97.4 F (36.3 C)  97.4 F (36.3 C)  TempSrc:  Oral  Oral  Resp:  22  22  Height:      Weight:  69 kg (152 lb 1.9 oz)    SpO2: 93% 93% 95% 93%    Intake/Output Summary (Last 24 hours) at 01/16/16 1459 Last data filed at 01/16/16 1424  Gross per 24 hour  Intake    320 ml  Output   2100 ml  Net  -1780 ml   Filed Weights   01/14/16 2023 01/15/16 0600 01/16/16 0550  Weight: 69.7 kg (153 lb 10.6 oz) 69.1 kg (152 lb 5.4 oz) 69 kg (152 lb 1.9 oz)    Exam:  General exam: Pleasant elderly female, moderately built and frail, lying comfortably propped up in bed with congested sounding intermittent cough. Respiratory system: Improved breath sounds compared to yesterday but still harsh with occasional rhonchi. No increased work of breathing. Cardiovascular system: S1 &  S2 heard, RRR. No JVD, murmurs, gallops, clicks or pedal edema. Telemetry: PAF/SR Gastrointestinal system: Abdomen is nondistended, soft and nontender. Normal bowel sounds heard. Central nervous system: Alert and oriented to self and place. No focal neurological  deficits. Extremities: Symmetric 5 x 5 power.   Data Reviewed: Basic Metabolic Panel:  Recent Labs Lab 01/14/16 1315 01/15/16 0542 01/16/16 0527  NA 143 146* 146*  K 3.8 4.0 4.4  CL 103 106 106  CO2 31 32 32  GLUCOSE 110* 149* 156*  BUN 15 18 26*  CREATININE 0.53 0.48 0.49  CALCIUM 8.7* 8.8* 8.9   Liver Function Tests: No results for input(s): AST, ALT, ALKPHOS, BILITOT, PROT, ALBUMIN in the last 168 hours. No results for input(s): LIPASE, AMYLASE in the last 168 hours. No results for input(s): AMMONIA in the last 168 hours. CBC:  Recent Labs Lab 01/14/16 1315 01/15/16 0542 01/16/16 0527  WBC 21.3* 19.3* 18.2*  NEUTROABS 18.0*  --   --   HGB 11.2* 10.8* 10.4*  HCT 37.9 36.3 35.2*  MCV 90.2 91.2 91.0  PLT 416* 383 441*   Cardiac Enzymes: No results for input(s): CKTOTAL, CKMB, CKMBINDEX, TROPONINI in the last 168 hours. BNP (last 3 results) No results for input(s): PROBNP in the last 8760 hours. CBG: No results for input(s): GLUCAP in the last 168 hours.  Recent Results (from the past 240 hour(s))  Culture, blood (routine x 2) Call MD if unable to obtain prior to antibiotics being given     Status: None (Preliminary result)   Collection Time: 01/14/16  7:14 PM  Result Value Ref Range Status   Specimen Description BLOOD RIGHT ANTECUBITAL  Final   Special Requests BOTTLES DRAWN AEROBIC AND ANAEROBIC 5ML EA  Final   Culture   Final    NO GROWTH 2 DAYS Performed at [facility]    Report Status PENDING  Incomplete  Culture, blood (routine x 2) Call MD if unable to obtain prior to antibiotics being given     Status: None (Preliminary result)   Collection Time: 01/14/16  7:14 PM  Result Value Ref Range Status   Specimen Description BLOOD RIGHT ARM  Final   Special Requests BOTTLES DRAWN AEROBIC ONLY 5 ML  Final   Culture   Final    NO GROWTH 2 DAYS Performed at [facility]    Report Status PENDING  Incomplete         Studies: No  results found.      Scheduled Meds: . aspirin  325 mg Oral Daily  . atorvastatin  40 mg Oral QODAY  . azithromycin  500 mg Oral Q24H  . captopril  50 mg Oral BID  . cefTRIAXone (ROCEPHIN)  IV  1 g Intravenous Q24H  . enoxaparin (LOVENOX) injection  40 mg Subcutaneous Q24H  . free water  200 mL Oral Q4H  . guaiFENesin  1,200 mg Oral BID  . ipratropium-albuterol  3 mL Nebulization Q6H  . metoprolol succinate  25 mg Oral QHS  . metoprolol succinate  50 mg Oral Daily  . omega-3 acid ethyl esters  1 g Oral BID  . [START ON 01/17/2016] predniSONE  30 mg Oral Q breakfast  . sodium chloride flush  3 mL Intravenous Q12H  . verapamil  240 mg Oral Daily   Continuous Infusions: . dextrose 60 mL/hr at 01/16/16 0806    Principal Problem:   CAP (community acquired pneumonia) Active Problems:   Paroxysmal atrial fibrillation ([place])  Tobacco abuse   Hyperlipidemia   Essential hypertension   COPD exacerbation (HCC)   Acute on chronic respiratory failure with hypoxia (HCC)   Leukocytosis   Community acquired pneumonia   Pressure ulcer    Time spent: 25 minutes.    Vernell Leep, MD, FACP, FHM. [facility] Pager [phone]  If 7PM-7AM, please contact night-coverage [email or web address] Password [email or web address] 01/16/2016, 2:59 PM    LOS: 2 days

## 2016-01-16 NOTE — Care Management Note (Signed)
Case Management Note  Patient Details  Name: KIMERA PAPAGEORGIOU MRN: [id number] Date of Birth: Jul 04, 1924  Subjective/Objective: CMCSW spoke to both sons & patient in rm about d/c plans-informed them of PT/OT recc.Provided w/HHC agency list, pvt sitter list, & CSW will fax out info to SNF.We will get back to patient to confirm d/c plan.Patient currently leaning more toward going home but willing to go to [place].                   Action/Plan:d/c plan SNF.   Expected Discharge Date:                  Expected Discharge Plan:  [place]  In-House Referral:     Discharge planning Services  CM Consult  Post Acute Care Choice:    Choice offered to:     DME Arranged:    DME Agency:     HH Arranged:    [place] Agency:     Status of Service:  In process, will continue to follow  Medicare Important Message Given:    Date Medicare IM Given:    Medicare IM give by:    Date Additional Medicare IM Given:    Additional Medicare Important Message give by:     If discussed at [place] of Stay Meetings, dates discussed:    Additional Comments:  Dessa Phi, RN 01/16/2016, 2:33 PM

## 2016-01-16 NOTE — NC FL2 (Signed)
[place] MEDICAID FL2 LEVEL OF CARE SCREENING TOOL     IDENTIFICATION  Patient Name: [name] Birthdate: 1924/03/01 Sex: female Admission Date (Current Location): 01/14/2016  [facility] and [place] Number:  Herbalist and Address:  [facility],  [place] [address] [place]      Provider Number: [id number]  Attending Physician Name and Address:  Modena Jansky, MD  Relative Name and Phone Number:       Current Level of Care: Hospital Recommended Level of Care: [place] Prior Approval Number:    Date Approved/Denied:   PASRR Number: [id number]  Discharge Plan: SNF    Current Diagnoses: Patient Active Problem List   Diagnosis Date Noted  . Pressure ulcer 01/15/2016  . CAP (community acquired pneumonia) 01/14/2016  . COPD exacerbation ([place]) 01/14/2016  . Acute on chronic respiratory failure with hypoxia ([place]) 01/14/2016  . Leukocytosis 01/14/2016  . Community acquired pneumonia 01/14/2016  . Chest pain 07/30/2013  . Paroxysmal atrial fibrillation ([place]) 07/30/2013  . Tobacco abuse 07/30/2013  . Hyperlipidemia 07/30/2013  . Essential hypertension 07/30/2013  . Family history of heart disease 07/30/2013  . Dyspnea on exertion 07/30/2013  . COPD (chronic obstructive pulmonary disease) ([place]) 04/06/2012    Orientation RESPIRATION BLADDER Height & Weight     Self, Situation, Time, Place  O2 (3L) Continent Weight: 152 lb 1.9 oz (69 kg) Height:  5' 7.5" (171.5 cm)  BEHAVIORAL SYMPTOMS/MOOD NEUROLOGICAL BOWEL NUTRITION STATUS      Continent Diet (Heart Healthy)  AMBULATORY STATUS COMMUNICATION OF NEEDS Skin   Limited Assist Verbally PU Stage and Appropriate Care (PressureUlcer03/26/17StageI-Intactskinwithnon-blanchablerednessofalocalizedareausuallyoverabonyprominence.)                       Personal Care Assistance Level of Assistance  Bathing, Dressing Bathing Assistance: Limited  assistance   Dressing Assistance: Limited assistance     Functional Limitations Info             SPECIAL CARE FACTORS FREQUENCY  PT (By licensed PT), OT (By licensed OT)     PT Frequency: 5 OT Frequency: 5            Contractures      Additional Factors Info  Code Status, Allergies Code Status Info: Fullcode Allergies Info: Allergies:  Cefuroxime           Current Medications (01/16/2016):  This is the current hospital active medication list Current Facility-Administered Medications  Medication Dose Route Frequency Provider Last Rate Last Dose  . acetaminophen (TYLENOL) tablet 650 mg  650 mg Oral Q6H PRN Modena Jansky, MD       Or  . acetaminophen (TYLENOL) suppository 650 mg  650 mg Rectal Q6H PRN Modena Jansky, MD      . albuterol (PROVENTIL) (2.5 MG/3ML) 0.083% nebulizer solution 2.5 mg  2.5 mg Nebulization Q2H PRN Modena Jansky, MD   2.5 mg at 01/15/16 1217  . aspirin tablet 325 mg  325 mg Oral Daily Modena Jansky, MD   325 mg at 01/16/16 1005  . atorvastatin (LIPITOR) tablet 40 mg  40 mg Oral QODAY Modena Jansky, MD   40 mg at 01/15/16 0908  . azithromycin (ZITHROMAX) tablet 500 mg  500 mg Oral Q24H Modena Jansky, MD   500 mg at 01/15/16 1800  . captopril (CAPOTEN) tablet 50 mg  50 mg Oral BID Modena Jansky, MD   50 mg at 01/16/16 1005  .  cefTRIAXone (ROCEPHIN) 1 g in dextrose 5 % 50 mL IVPB  1 g Intravenous Q24H Modena Jansky, MD   1 g at 01/15/16 1522  . dextrose 5 % solution   Intravenous Continuous Modena Jansky, MD 60 mL/hr at 01/16/16 0806    . diazepam (VALIUM) tablet 5 mg  5 mg Oral Daily PRN Modena Jansky, MD      . enoxaparin (LOVENOX) injection 40 mg  40 mg Subcutaneous Q24H Modena Jansky, MD   40 mg at 01/15/16 2123  . free water 200 mL  200 mL Oral Q4H Modena Jansky, MD   200 mL at 01/16/16 1200  . guaiFENesin (MUCINEX) 12 hr tablet 1,200 mg  1,200 mg Oral BID Modena Jansky, MD   1,200 mg at 01/16/16 1005  .  ipratropium-albuterol (DUONEB) 0.5-2.5 (3) MG/3ML nebulizer solution 3 mL  3 mL Nebulization Q6H Modena Jansky, MD   3 mL at 01/16/16 1404  . methylPREDNISolone sodium succinate (SOLU-MEDROL) 40 mg/mL injection 40 mg  40 mg Intravenous Q12H Modena Jansky, MD   40 mg at 01/16/16 1005  . metoprolol succinate (TOPROL-XL) 24 hr tablet 25 mg  25 mg Oral QHS Modena Jansky, MD   25 mg at 01/15/16 2123  . metoprolol succinate (TOPROL-XL) 24 hr tablet 50 mg  50 mg Oral Daily Modena Jansky, MD   50 mg at 01/16/16 1005  . omega-3 acid ethyl esters (LOVAZA) capsule 1 g  1 g Oral BID Modena Jansky, MD   1 g at 01/16/16 1005  . ondansetron (ZOFRAN) tablet 4 mg  4 mg Oral Q6H PRN Modena Jansky, MD       Or  . ondansetron (ZOFRAN) injection 4 mg  4 mg Intravenous Q6H PRN Modena Jansky, MD      . sodium chloride flush (NS) 0.9 % injection 3 mL  3 mL Intravenous Q12H Modena Jansky, MD   3 mL at 01/15/16 2124  . verapamil (CALAN-SR) CR tablet 240 mg  240 mg Oral Daily Modena Jansky, MD   240 mg at 01/16/16 1005     Discharge Medications: Please see discharge summary for a list of discharge medications.  Relevant Imaging Results:  Relevant Lab Results:   Additional Information SSN: [id number]  Standley Brooking, LCSW

## 2016-01-16 NOTE — Care Management Note (Signed)
Case Management Note  Patient Details  Name: [name] MRN: [id number] Date of Birth: 1924/09/30  Subjective/Objective: Patient informed of PT/OT recc-requested I talk to her son [name] [phone])-they will meet w/CM/CSW @ 1:30p to discuss options-HHC/SNF.                 Action/Plan:d/c plan SNF.   Expected Discharge Date:                  Expected Discharge Plan:  [place]  In-House Referral:     Discharge planning Services  CM Consult  Post Acute Care Choice:    Choice offered to:     DME Arranged:    DME Agency:     HH Arranged:    [place] Agency:     Status of Service:  In process, will continue to follow  Medicare Important Message Given:    Date Medicare IM Given:    Medicare IM give by:    Date Additional Medicare IM Given:    Additional Medicare Important Message give by:     If discussed at [place] of Stay Meetings, dates discussed:    Additional Comments:  Dessa Phi, RN 01/16/2016, 12:28 PM

## 2016-01-16 NOTE — Clinical Social Work Note (Signed)
Clinical Social Work Assessment  Patient Details  Name: [name] MRN: [id number] Date of Birth: 1924/01/30  Date of referral:  01/16/16               Reason for consult:  Facility Placement                Permission sought to share information with:  Chartered certified accountant granted to share information::  Yes, Verbal Permission Granted  Name::        Agency::     Relationship::     Contact Information:     Housing/Transportation Living arrangements for the past 2 months:  Single Family Home Source of Information:  Patient, Adult Children Patient Interpreter Needed:  None Criminal Activity/Legal Involvement Pertinent to Current Situation/Hospitalization:  No - Comment as needed Significant Relationships:  Adult Children Lives with:  Self Do you feel safe going back to the place where you live?  No Need for family participation in patient care:  Yes (Comment)  Care giving concerns:  CSW received call from [facility], Juliann Pulse that patient's sons would like to meet re: discharge planning.    Social Worker assessment / plan:  CSW reviewed PT evaluation - recommended HH & OT evaluation - recommended SNF.   Employment status:  Retired Nurse, adult PT Recommendations:  Home with [place], [place] / Referral to community resources:  [place]  Patient/Family's Response to care:  Patient's son, Dominica Severin is requesting SNF - patient is asking for [facility]. CSW confirmed with Jeani Hawking at [facility] that they would be able to accept patient when ready.   Patient/Family's Understanding of and Emotional Response to Diagnosis, Current Treatment, and Prognosis:  Patient's son informed CSW that he does not feel comfortable assisting patient with ADL's and agrees with OT recommendation of SNF at discharge.   Emotional Assessment Appearance:  Appears stated age Attitude/Demeanor/Rapport:    Affect (typically  observed):    Orientation:  Oriented to Self, Oriented to Place, Oriented to  Time, Oriented to Situation Alcohol / Substance use:    Psych involvement (Current and /or in the community):     Discharge Needs  Concerns to be addressed:    Readmission within the last 30 days:    Current discharge risk:    Barriers to Discharge:      Standley Brooking, LCSW 01/16/2016, 2:07 PM

## 2016-01-16 NOTE — Clinical Social Work Placement (Signed)
Patient has a bed at [facility]. CSW has completed FL2 & will continue to follow and assist with discharge when ready.    Raynaldo Opitz, [facility] Clinical Social Worker cell #: [phone]     CLINICAL SOCIAL WORK PLACEMENT  NOTE  Date:  01/16/2016  Patient Details  Name: [name] MRN: [id number] Date of Birth: Mar 17, 1924  Clinical Social Work is seeking post-discharge placement for this patient at the [place] level of care (*CSW will initial, date and re-position this form in  chart as items are completed):  Yes   Patient/family provided with [place] Work Department's list of facilities offering this level of care within the geographic area requested by the patient (or if unable, by the patient's family).  Yes   Patient/family informed of their freedom to choose among providers that offer the needed level of care, that participate in Medicare, Medicaid or managed care program needed by the patient, have an available bed and are willing to accept the patient.  Yes   Patient/family informed of [place]'s ownership interest in [facility] and [facility], as well as of the fact that they are under no obligation to receive care at these facilities.  PASRR submitted to EDS on 01/16/16     PASRR number received on 01/16/16     Existing PASRR number confirmed on       FL2 transmitted to all facilities in geographic area requested by pt/family on 01/16/16     FL2 transmitted to all facilities within larger geographic area on       Patient informed that his/her managed care company has contracts with or will negotiate with certain facilities, including the following:        Yes   Patient/family informed of bed offers received.  Patient chooses bed at [facility]     Physician recommends and patient chooses bed at      Patient to be transferred to [place] on  .  Patient to be transferred to  facility by       Patient family notified on   of transfer.  Name of family member notified:        PHYSICIAN       Additional Comment:    _______________________________________________ Standley Brooking, LCSW 01/16/2016, 2:10 PM

## 2016-01-16 NOTE — Discharge Summary (Deleted)
PROGRESS NOTE    IDELL DUQUAINE  [id number]  DOB: 1924/07/24  DOA: 01/14/2016 PCP: Merrilee Seashore, MD Outpatient Specialists: Outpatient Specialists:  1. Cardiology: Dr. Lance Coon course: 82 y.o. female , single, lives alone but for the last 3 weeks has been living with her oldest son, ambulates with the help of a walker, PMH of HTN, HLD, COPD on home oxygen 2 L/m at bedtime, ongoing tobacco abuse, PAF, LBBB, recently treated influenza, presented to ED on 01/14/16 with complaints of ongoing cough, dyspnea, poor appetite, unable to sleep and sweats. Over the last 2 weeks, treated for UTI followed by influenza and completed course of Tamiflu 01/13/16. Admitted for possible community-acquired pneumonia.   Assessment & Plan:   Left lower lobe community acquired pneumonia - Likely postviral secondary bacterial infection. - Recently treated for influenza A and completed a course of Tamiflu on 3/25. - Check blood culture (negative to date), sputum culture-never sent, urine streptococcal antigen: Negative. HIV antibody: Nonreactive - Started on IV Rocephin and azithromycin, continue same. - Aggressive pulmonary toilet. Seems quite congested. - Continue management.  COPD exacerbation - Precipitated by pneumonia, recent influenza A URTI complicating underlying COPD and ongoing tobacco abuse. - Tobacco cessation counseled. - Continue treatment for pneumonia as above, brief IV Solu-Medrol, bronchodilator nebulizations, oxygen, incentive spirometry, flutter valve and aggressive pulmonary toilet. - Seems slightly better. Continue management.  Tobacco abuse - Cessation counseled - Patient declines nicotine patch.  Acute on chronic hypoxic respiratory failure - Precipitated by pneumonia and recent viral URTI. - Oxygen saturation 78% on room air on arrival. - Oxygen supplementation and rest of management as above. - At baseline patient on 2 L oxygen via nasal cannula at  bedtime.  Essential hypertension - Continue home medications. Reasonable inpatient control.  Paroxysmal atrial fibrillation - Continue verapamil and aspirin. Likely not a candidate for anticoagulation due to advanced age and fall risk. - Stable.  Leukocytosis - Improving.   Hyperlipidemia - Continue statins.  Anemia - Hemoglobin probably at baseline. Stable.   DVT Prophylaxis: Lovenox Code Status: Full  Family Communication: None at bedside.  Disposition Plan: DC home when medically stable, possibly in the next 3-4 days.    Consultants:  None  Procedures:  None  Antimicrobials:  IV Rocephin 3/26>  Azithromycin 3/26>   Subjective: Feels better. Improved dyspnea and cough. Denies any other complaints.  Objective: Filed Vitals:   01/16/16 0157 01/16/16 0550 01/16/16 1404 01/16/16 1423  BP:  169/70  157/63  Pulse:  81  80  Temp:  97.4 F (36.3 C)  97.4 F (36.3 C)  TempSrc:  Oral  Oral  Resp:  22  22  Height:      Weight:  69 kg (152 lb 1.9 oz)    SpO2: 93% 93% 95% 93%    Intake/Output Summary (Last 24 hours) at 01/16/16 1457 Last data filed at 01/16/16 1424  Gross per 24 hour  Intake    320 ml  Output   2100 ml  Net  -1780 ml   Filed Weights   01/14/16 2023 01/15/16 0600 01/16/16 0550  Weight: 69.7 kg (153 lb 10.6 oz) 69.1 kg (152 lb 5.4 oz) 69 kg (152 lb 1.9 oz)    Exam:  General exam: Pleasant elderly female, moderately built and frail, lying comfortably propped up in bed with congested sounding intermittent cough. Respiratory system: Improved breath sounds compared to yesterday but still harsh with occasional rhonchi. No increased work of breathing. Cardiovascular system: S1 &  S2 heard, RRR. No JVD, murmurs, gallops, clicks or pedal edema. Telemetry: PAF/SR Gastrointestinal system: Abdomen is nondistended, soft and nontender. Normal bowel sounds heard. Central nervous system: Alert and oriented to self and place. No focal neurological  deficits. Extremities: Symmetric 5 x 5 power.   Data Reviewed: Basic Metabolic Panel:  Recent Labs Lab 01/14/16 1315 01/15/16 0542 01/16/16 0527  NA 143 146* 146*  K 3.8 4.0 4.4  CL 103 106 106  CO2 31 32 32  GLUCOSE 110* 149* 156*  BUN 15 18 26*  CREATININE 0.53 0.48 0.49  CALCIUM 8.7* 8.8* 8.9   Liver Function Tests: No results for input(s): AST, ALT, ALKPHOS, BILITOT, PROT, ALBUMIN in the last 168 hours. No results for input(s): LIPASE, AMYLASE in the last 168 hours. No results for input(s): AMMONIA in the last 168 hours. CBC:  Recent Labs Lab 01/14/16 1315 01/15/16 0542 01/16/16 0527  WBC 21.3* 19.3* 18.2*  NEUTROABS 18.0*  --   --   HGB 11.2* 10.8* 10.4*  HCT 37.9 36.3 35.2*  MCV 90.2 91.2 91.0  PLT 416* 383 441*   Cardiac Enzymes: No results for input(s): CKTOTAL, CKMB, CKMBINDEX, TROPONINI in the last 168 hours. BNP (last 3 results) No results for input(s): PROBNP in the last 8760 hours. CBG: No results for input(s): GLUCAP in the last 168 hours.  Recent Results (from the past 240 hour(s))  Culture, blood (routine x 2) Call MD if unable to obtain prior to antibiotics being given     Status: None (Preliminary result)   Collection Time: 01/14/16  7:14 PM  Result Value Ref Range Status   Specimen Description BLOOD RIGHT ANTECUBITAL  Final   Special Requests BOTTLES DRAWN AEROBIC AND ANAEROBIC 5ML EA  Final   Culture   Final    NO GROWTH 2 DAYS Performed at [facility]    Report Status PENDING  Incomplete  Culture, blood (routine x 2) Call MD if unable to obtain prior to antibiotics being given     Status: None (Preliminary result)   Collection Time: 01/14/16  7:14 PM  Result Value Ref Range Status   Specimen Description BLOOD RIGHT ARM  Final   Special Requests BOTTLES DRAWN AEROBIC ONLY 5 ML  Final   Culture   Final    NO GROWTH 2 DAYS Performed at [facility]    Report Status PENDING  Incomplete         Studies: No  results found.      Scheduled Meds: . aspirin  325 mg Oral Daily  . atorvastatin  40 mg Oral QODAY  . azithromycin  500 mg Oral Q24H  . captopril  50 mg Oral BID  . cefTRIAXone (ROCEPHIN)  IV  1 g Intravenous Q24H  . enoxaparin (LOVENOX) injection  40 mg Subcutaneous Q24H  . free water  200 mL Oral Q4H  . guaiFENesin  1,200 mg Oral BID  . ipratropium-albuterol  3 mL Nebulization Q6H  . metoprolol succinate  25 mg Oral QHS  . metoprolol succinate  50 mg Oral Daily  . omega-3 acid ethyl esters  1 g Oral BID  . [START ON 01/17/2016] predniSONE  30 mg Oral Q breakfast  . sodium chloride flush  3 mL Intravenous Q12H  . verapamil  240 mg Oral Daily   Continuous Infusions: . dextrose 60 mL/hr at 01/16/16 0806    Principal Problem:   CAP (community acquired pneumonia) Active Problems:   Paroxysmal atrial fibrillation ([place])  Tobacco abuse   Hyperlipidemia   Essential hypertension   COPD exacerbation (HCC)   Acute on chronic respiratory failure with hypoxia (HCC)   Leukocytosis   Community acquired pneumonia   Pressure ulcer    Time spent: 25 minutes.    Vernell Leep, MD, FACP, FHM. [facility] Pager [phone]  If 7PM-7AM, please contact night-coverage [email or web address] Password [email or web address] 01/16/2016, 2:57 PM    LOS: 2 days

## 2016-01-16 NOTE — Discharge Summary (Deleted)
PROGRESS NOTE    [name]  [id number]  DOB: 04/28/1924  DOA: 01/14/2016 PCP: Merrilee Seashore, MD Outpatient Specialists: Outpatient Specialists:  1. Cardiology: Dr. Lance Coon course: 82 y.o. female , single, lives alone but for the last 3 weeks has been living with her oldest son, ambulates with the help of a walker, PMH of HTN, HLD, COPD on home oxygen 2 L/m at bedtime, ongoing tobacco abuse, PAF, LBBB, recently treated influenza, presented to ED on 01/14/16 with complaints of ongoing cough, dyspnea, poor appetite, unable to sleep and sweats. Over the last 2 weeks, treated for UTI followed by influenza and completed course of Tamiflu 01/13/16. Admitted for possible community-acquired pneumonia and COPD exacerbation. Improving. Mild hypernatremia. Possible DC home in 1-2 days.   Assessment & Plan:   Left lower lobe community acquired pneumonia - Likely postviral secondary bacterial infection. - Recently treated for influenza A and completed a course of Tamiflu on 3/25. - Check blood culture (negative to date), sputum culture-never sent, urine streptococcal antigen: Negative. HIV antibody: Nonreactive - Started on IV Rocephin and azithromycin, continue same. - Aggressive pulmonary toilet. Congestion seems to be improving. Follow-up chest x-ray 3/29. - Continue management.  COPD exacerbation - Precipitated by pneumonia, recent influenza A URTI complicating underlying COPD and ongoing tobacco abuse. - Tobacco cessation counseled. - Continue treatment for pneumonia as above, brief IV Solu-Medrol, bronchodilator nebulizations, oxygen, incentive spirometry, flutter valve and aggressive pulmonary toilet. - Improved. Change IV Solu-Medrol to oral prednisone and rapid taper.  Tobacco abuse - Cessation counseled - Patient declines nicotine patch.  Acute on chronic hypoxic respiratory failure - Precipitated by pneumonia and recent viral URTI. - Oxygen  saturation 78% on room air on arrival. - Oxygen supplementation and rest of management as above. - At baseline patient on 2 L oxygen via nasal cannula at bedtime.  Essential hypertension - Continue home medications. Reasonable inpatient control.  Paroxysmal atrial fibrillation - Continue verapamil and aspirin. Likely not a candidate for anticoagulation due to advanced age and fall risk. - Stable.  Leukocytosis - Improving.   Hyperlipidemia - Continue statins.  Anemia - Hemoglobin probably at baseline. Stable.  Mild hypernatremia - Brief IV D5W and encouraged oral free water intake. Follow BMP in a.m.   DVT Prophylaxis: Lovenox Code Status: Full  Family Communication: None at bedside.  Disposition Plan: DC home when medically stable, possibly in the next 1-2 days.    Consultants:  None  Procedures:  None  Antimicrobials:  IV Rocephin 3/26>  Azithromycin 3/26>   Subjective: Overall continues to feel better with improved dyspnea-and states that her breathing is close to baseline, decreased cough and no chest pain.  Objective: Filed Vitals:   01/16/16 0157 01/16/16 0550 01/16/16 1404 01/16/16 1423  BP:  169/70  157/63  Pulse:  81  80  Temp:  97.4 F (36.3 C)  97.4 F (36.3 C)  TempSrc:  Oral  Oral  Resp:  22  22  Height:      Weight:  69 kg (152 lb 1.9 oz)    SpO2: 93% 93% 95% 93%    Intake/Output Summary (Last 24 hours) at 01/16/16 1449 Last data filed at 01/16/16 1424  Gross per 24 hour  Intake    320 ml  Output   2100 ml  Net  -1780 ml   Filed Weights   01/14/16 2023 01/15/16 0600 01/16/16 0550  Weight: 69.7 kg (153 lb 10.6 oz) 69.1 kg (152 lb 5.4 oz) 69  kg (152 lb 1.9 oz)    Exam:  General exam: Pleasant elderly female, moderately built and frail, sitting up comfortably in bed eating breakfast this morning. Appears much improved compared to the last 2 days-no congested cough. Respiratory system: Improved breath sounds. Occasional basal  crackles. Occasional rhonchi posteriorly. No increased work of breathing. Cardiovascular system: S1 & S2 heard, RRR. No JVD, murmurs, gallops, clicks or pedal edema. Telemetry: PAF/SR with BBB morphology and occasional PVCs Gastrointestinal system: Abdomen is nondistended, soft and nontender. Normal bowel sounds heard. Central nervous system: Alert and oriented to self and place. No focal neurological deficits. Extremities: Symmetric 5 x 5 power.   Data Reviewed: Basic Metabolic Panel:  Recent Labs Lab 01/14/16 1315 01/15/16 0542 01/16/16 0527  NA 143 146* 146*  K 3.8 4.0 4.4  CL 103 106 106  CO2 31 32 32  GLUCOSE 110* 149* 156*  BUN 15 18 26*  CREATININE 0.53 0.48 0.49  CALCIUM 8.7* 8.8* 8.9   Liver Function Tests: No results for input(s): AST, ALT, ALKPHOS, BILITOT, PROT, ALBUMIN in the last 168 hours. No results for input(s): LIPASE, AMYLASE in the last 168 hours. No results for input(s): AMMONIA in the last 168 hours. CBC:  Recent Labs Lab 01/14/16 1315 01/15/16 0542 01/16/16 0527  WBC 21.3* 19.3* 18.2*  NEUTROABS 18.0*  --   --   HGB 11.2* 10.8* 10.4*  HCT 37.9 36.3 35.2*  MCV 90.2 91.2 91.0  PLT 416* 383 441*   Cardiac Enzymes: No results for input(s): CKTOTAL, CKMB, CKMBINDEX, TROPONINI in the last 168 hours. BNP (last 3 results) No results for input(s): PROBNP in the last 8760 hours. CBG: No results for input(s): GLUCAP in the last 168 hours.  Recent Results (from the past 240 hour(s))  Culture, blood (routine x 2) Call MD if unable to obtain prior to antibiotics being given     Status: None (Preliminary result)   Collection Time: 01/14/16  7:14 PM  Result Value Ref Range Status   Specimen Description BLOOD RIGHT ANTECUBITAL  Final   Special Requests BOTTLES DRAWN AEROBIC AND ANAEROBIC 5ML EA  Final   Culture   Final    NO GROWTH 2 DAYS Performed at [facility]    Report Status PENDING  Incomplete  Culture, blood (routine x 2) Call MD if  unable to obtain prior to antibiotics being given     Status: None (Preliminary result)   Collection Time: 01/14/16  7:14 PM  Result Value Ref Range Status   Specimen Description BLOOD RIGHT ARM  Final   Special Requests BOTTLES DRAWN AEROBIC ONLY 5 ML  Final   Culture   Final    NO GROWTH 2 DAYS Performed at [facility]    Report Status PENDING  Incomplete         Studies: No results found.      Scheduled Meds: . aspirin  325 mg Oral Daily  . atorvastatin  40 mg Oral QODAY  . azithromycin  500 mg Oral Q24H  . captopril  50 mg Oral BID  . cefTRIAXone (ROCEPHIN)  IV  1 g Intravenous Q24H  . enoxaparin (LOVENOX) injection  40 mg Subcutaneous Q24H  . free water  200 mL Oral Q4H  . guaiFENesin  1,200 mg Oral BID  . ipratropium-albuterol  3 mL Nebulization Q6H  . methylPREDNISolone (SOLU-MEDROL) injection  40 mg Intravenous Q12H  . metoprolol succinate  25 mg Oral QHS  . metoprolol succinate  50 mg  Oral Daily  . omega-3 acid ethyl esters  1 g Oral BID  . sodium chloride flush  3 mL Intravenous Q12H  . verapamil  240 mg Oral Daily   Continuous Infusions: . dextrose 60 mL/hr at 01/16/16 0806    Principal Problem:   CAP (community acquired pneumonia) Active Problems:   Paroxysmal atrial fibrillation (HCC)   Tobacco abuse   Hyperlipidemia   Essential hypertension   COPD exacerbation (HCC)   Acute on chronic respiratory failure with hypoxia (HCC)   Leukocytosis   Community acquired pneumonia   Pressure ulcer    Time spent: 25 minutes.    Vernell Leep, MD, FACP, FHM. [facility] Pager [phone]  If 7PM-7AM, please contact night-coverage [email or web address] Password [email or web address] 01/16/2016, 2:49 PM    LOS: 2 days

## 2016-01-16 NOTE — Consult Note (Signed)
   [facility] CM Inpatient Consult   01/16/2016  [name] [birth year] [id number]   Patient screened for [facility] program services. Went to bedside but patient was speaking with inpatient RNCM. Spoke with inpatient RNCM about seeing patient prior to bedside visit. Advised that discharge plan may change to SNF. Therefore, Probation officer should wait before speaking with patient about [place] [facility] at this time. Inpatient RNCM states she will alert writer if disposition changes to home and not SNF. Will engage for [facility] Care Management if/when appropriate.    Marthenia Rolling, MSN-Ed, RN,BSN [facility] Liaison [phone]

## 2016-01-16 NOTE — Progress Notes (Signed)
PROGRESS NOTE    [name]  [id number]  DOB: 03-Jan-1924  DOA: 01/14/2016 PCP: Merrilee Seashore, MD Outpatient Specialists: Outpatient Specialists:  1. Cardiology: Dr. Lance Coon course: 82 y.o. female , single, lives alone but for the last 3 weeks has been living with her oldest son, ambulates with the help of a walker, PMH of HTN, HLD, COPD on home oxygen 2 L/m at bedtime, ongoing tobacco abuse, PAF, LBBB, recently treated influenza, presented to ED on 01/14/16 with complaints of ongoing cough, dyspnea, poor appetite, unable to sleep and sweats. Over the last 2 weeks, treated for UTI followed by influenza and completed course of Tamiflu 01/13/16. Admitted for possible community-acquired pneumonia and COPD exacerbation. Improving. Mild hypernatremia. Possible DC home in 1-2 days.   Assessment & Plan:   Left lower lobe community acquired pneumonia - Likely postviral secondary bacterial infection. - Recently treated for influenza A and completed a course of Tamiflu on 3/25. - Check blood culture (negative to date), sputum culture-never sent, urine streptococcal antigen: Negative. HIV antibody: Nonreactive - Started on IV Rocephin and azithromycin, continue same. - Aggressive pulmonary toilet. Congestion seems to be improving. Follow-up chest x-ray 3/29. - Continue management.  COPD exacerbation - Precipitated by pneumonia, recent influenza A URTI complicating underlying COPD and ongoing tobacco abuse. - Tobacco cessation counseled. - Continue treatment for pneumonia as above, brief IV Solu-Medrol, bronchodilator nebulizations, oxygen, incentive spirometry, flutter valve and aggressive pulmonary toilet. - Improved. Change IV Solu-Medrol to oral prednisone and rapid taper.  Tobacco abuse - Cessation counseled - Patient declines nicotine patch.  Acute on chronic hypoxic respiratory failure - Precipitated by pneumonia and recent viral URTI. - Oxygen  saturation 78% on room air on arrival. - Oxygen supplementation and rest of management as above. - At baseline patient on 2 L oxygen via nasal cannula at bedtime.  Essential hypertension - Continue home medications. Reasonable inpatient control.  Paroxysmal atrial fibrillation - Continue verapamil and aspirin. Likely not a candidate for anticoagulation due to advanced age and fall risk. - Stable.  Leukocytosis - Improving.   Hyperlipidemia - Continue statins.  Anemia - Hemoglobin probably at baseline. Stable.  Mild hypernatremia - Brief IV D5W and encouraged oral free water intake. Follow BMP in a.m.   DVT Prophylaxis: Lovenox Code Status: Full  Family Communication: None at bedside.  Disposition Plan: DC home when medically stable, possibly in the next 1-2 days.    Consultants:  None  Procedures:  None  Antimicrobials:  IV Rocephin 3/26>  Azithromycin 3/26>   Subjective: Overall continues to feel better with improved dyspnea-and states that her breathing is close to baseline, decreased cough and no chest pain.  Objective: Filed Vitals:   01/16/16 0157 01/16/16 0550 01/16/16 1404 01/16/16 1423  BP:  169/70  157/63  Pulse:  81  80  Temp:  97.4 F (36.3 C)  97.4 F (36.3 C)  TempSrc:  Oral  Oral  Resp:  22  22  Height:      Weight:  69 kg (152 lb 1.9 oz)    SpO2: 93% 93% 95% 93%    Intake/Output Summary (Last 24 hours) at 01/16/16 1500 Last data filed at 01/16/16 1500  Gross per 24 hour  Intake    734 ml  Output   2100 ml  Net  -1366 ml   Filed Weights   01/14/16 2023 01/15/16 0600 01/16/16 0550  Weight: 69.7 kg (153 lb 10.6 oz) 69.1 kg (152 lb 5.4 oz) 69  kg (152 lb 1.9 oz)    Exam:  General exam: Pleasant elderly female, moderately built and frail, sitting up comfortably in bed eating breakfast this morning. Appears much improved compared to the last 2 days-no congested cough. Respiratory system: Improved breath sounds. Occasional basal  crackles. Occasional rhonchi posteriorly. No increased work of breathing. Cardiovascular system: S1 & S2 heard, RRR. No JVD, murmurs, gallops, clicks or pedal edema. Telemetry: PAF/SR with BBB morphology and occasional PVCs Gastrointestinal system: Abdomen is nondistended, soft and nontender. Normal bowel sounds heard. Central nervous system: Alert and oriented to self and place. No focal neurological deficits. Extremities: Symmetric 5 x 5 power.   Data Reviewed: Basic Metabolic Panel:  Recent Labs Lab 01/14/16 1315 01/15/16 0542 01/16/16 0527  NA 143 146* 146*  K 3.8 4.0 4.4  CL 103 106 106  CO2 31 32 32  GLUCOSE 110* 149* 156*  BUN 15 18 26*  CREATININE 0.53 0.48 0.49  CALCIUM 8.7* 8.8* 8.9   Liver Function Tests: No results for input(s): AST, ALT, ALKPHOS, BILITOT, PROT, ALBUMIN in the last 168 hours. No results for input(s): LIPASE, AMYLASE in the last 168 hours. No results for input(s): AMMONIA in the last 168 hours. CBC:  Recent Labs Lab 01/14/16 1315 01/15/16 0542 01/16/16 0527  WBC 21.3* 19.3* 18.2*  NEUTROABS 18.0*  --   --   HGB 11.2* 10.8* 10.4*  HCT 37.9 36.3 35.2*  MCV 90.2 91.2 91.0  PLT 416* 383 441*   Cardiac Enzymes: No results for input(s): CKTOTAL, CKMB, CKMBINDEX, TROPONINI in the last 168 hours. BNP (last 3 results) No results for input(s): PROBNP in the last 8760 hours. CBG: No results for input(s): GLUCAP in the last 168 hours.  Recent Results (from the past 240 hour(s))  Culture, blood (routine x 2) Call MD if unable to obtain prior to antibiotics being given     Status: None (Preliminary result)   Collection Time: 01/14/16  7:14 PM  Result Value Ref Range Status   Specimen Description BLOOD RIGHT ANTECUBITAL  Final   Special Requests BOTTLES DRAWN AEROBIC AND ANAEROBIC 5ML EA  Final   Culture   Final    NO GROWTH 2 DAYS Performed at [facility]    Report Status PENDING  Incomplete  Culture, blood (routine x 2) Call MD if  unable to obtain prior to antibiotics being given     Status: None (Preliminary result)   Collection Time: 01/14/16  7:14 PM  Result Value Ref Range Status   Specimen Description BLOOD RIGHT ARM  Final   Special Requests BOTTLES DRAWN AEROBIC ONLY 5 ML  Final   Culture   Final    NO GROWTH 2 DAYS Performed at [facility]    Report Status PENDING  Incomplete         Studies: No results found.      Scheduled Meds: . aspirin  325 mg Oral Daily  . atorvastatin  40 mg Oral QODAY  . azithromycin  500 mg Oral Q24H  . captopril  50 mg Oral BID  . cefTRIAXone (ROCEPHIN)  IV  1 g Intravenous Q24H  . enoxaparin (LOVENOX) injection  40 mg Subcutaneous Q24H  . free water  200 mL Oral Q4H  . guaiFENesin  1,200 mg Oral BID  . ipratropium-albuterol  3 mL Nebulization Q6H  . metoprolol succinate  25 mg Oral QHS  . metoprolol succinate  50 mg Oral Daily  . omega-3 acid ethyl esters  1  g Oral BID  . [START ON 01/17/2016] predniSONE  30 mg Oral Q breakfast  . sodium chloride flush  3 mL Intravenous Q12H  . verapamil  240 mg Oral Daily   Continuous Infusions: . dextrose 60 mL/hr at 01/16/16 0806    Principal Problem:   CAP (community acquired pneumonia) Active Problems:   Paroxysmal atrial fibrillation (HCC)   Tobacco abuse   Hyperlipidemia   Essential hypertension   COPD exacerbation (HCC)   Acute on chronic respiratory failure with hypoxia (HCC)   Leukocytosis   Community acquired pneumonia   Pressure ulcer    Time spent: 25 minutes.    Vernell Leep, MD, FACP, FHM. [facility] Pager [phone]  If 7PM-7AM, please contact night-coverage [email or web address] Password [email or web address] 01/16/2016, 3:00 PM    LOS: 2 days

## 2016-01-17 ENCOUNTER — Inpatient Hospital Stay (HOSPITAL_COMMUNITY): Payer: Medicare Other

## 2016-01-17 DIAGNOSIS — J189 Pneumonia, unspecified organism: Secondary | ICD-10-CM

## 2016-01-17 DIAGNOSIS — J441 Chronic obstructive pulmonary disease with (acute) exacerbation: Secondary | ICD-10-CM

## 2016-01-17 DIAGNOSIS — I10 Essential (primary) hypertension: Secondary | ICD-10-CM

## 2016-01-17 DIAGNOSIS — J9621 Acute and chronic respiratory failure with hypoxia: Secondary | ICD-10-CM

## 2016-01-17 DIAGNOSIS — I480 Paroxysmal atrial fibrillation: Secondary | ICD-10-CM

## 2016-01-17 DIAGNOSIS — D72829 Elevated white blood cell count, unspecified: Secondary | ICD-10-CM

## 2016-01-17 LAB — BASIC METABOLIC PANEL
Anion gap: 7 (ref 5–15)
BUN: 28 mg/dL — AB (ref 6–20)
CHLORIDE: 104 mmol/L (ref 101–111)
CO2: 33 mmol/L — AB (ref 22–32)
CREATININE: 0.56 mg/dL (ref 0.44–1.00)
Calcium: 8.7 mg/dL — ABNORMAL LOW (ref 8.9–10.3)
GFR calc Af Amer: >60 mL/min (ref >60)
GFR calc non Af Amer: >60 mL/min (ref >60)
GLUCOSE: 111 mg/dL — AB (ref 65–99)
Potassium: 4.2 mmol/L (ref 3.5–5.1)
Sodium: 144 mmol/L (ref 135–145)

## 2016-01-17 LAB — CBC
HCT: 33.8 % — ABNORMAL LOW (ref 36.0–46.0)
Hemoglobin: 10.2 g/dL — ABNORMAL LOW (ref 12.0–15.0)
MCH: 26.6 pg (ref 26.0–34.0)
MCHC: 30.2 g/dL (ref 30.0–36.0)
MCV: 88.3 fL (ref 78.0–100.0)
PLATELETS: 451 10*3/uL — AB (ref 150–400)
RBC: 3.83 MIL/uL — AB (ref 3.87–5.11)
RDW: 15.3 % (ref 11.5–15.5)
WBC: 15.7 10*3/uL — ABNORMAL HIGH (ref 4.0–10.5)

## 2016-01-17 MED ORDER — IPRATROPIUM-ALBUTEROL 0.5-2.5 (3) MG/3ML IN SOLN
3.0000 mL | Freq: Three times a day (TID) | RESPIRATORY_TRACT | Status: DC
  Administered 2016-01-17 – 2016-01-18 (×4): 3 mL via RESPIRATORY_TRACT
  Filled 2016-01-17 (×4): qty 3

## 2016-01-17 MED ORDER — LEVOFLOXACIN 750 MG PO TABS
750.0000 mg | ORAL_TABLET | ORAL | Status: DC
  Administered 2016-01-17: 750 mg via ORAL
  Filled 2016-01-17: qty 1

## 2016-01-17 NOTE — Progress Notes (Signed)
Pharmacy Antibiotic Note  [name] is a 82 y.o. female admitted on 01/14/2016 with pneumonia.  Pharmacy has been consulted for renal-adjustment of antibiotics. 01/17/2016 switching from ceftriaxone/azithromycin to PO Levaquin 500mg  PO q24h. SCr is wnl, CrCl 47CG, 44 normalized and rounding SCr to 1.   Plan: Start Levaquin 750mg  PO q48h. Follow up renal fxn, culture results, and clinical course.  Height: 5' 7.5" (171.5 cm) Weight: 151 lb 14.4 oz (68.9 kg) IBW/kg (Calculated) : 62.75  Temp (24hrs), Avg:97.4 F (36.3 C), Min:97.2 F (36.2 C), Max:97.5 F (36.4 C)   Recent Labs Lab 01/14/16 1315 01/14/16 1327 01/15/16 0542 01/16/16 0527 01/17/16 0454  WBC 21.3*  --  19.3* 18.2* 15.7*  CREATININE 0.53  --  0.48 0.49 0.56  LATICACIDVEN  --  0.69  --   --   --     Estimated Creatinine Clearance: 47.3 mL/min (by C-G formula based on Cr of 0.56).    Allergies  Allergen Reactions  . Cefuroxime Nausea And Vomiting    Antimicrobials this admission: Ceftriazone 3/26 >> 3/29 Azithromycin 3/26 >> 3/29  Dose adjustments this admission:  Microbiology results: 3/7 UCx: enterococcus species - pan-sens  3/26 BCx: ngtd  Thank you for allowing pharmacy to be a part of this patient's care.  Romeo Rabon, PharmD, pager [phone]. 01/17/2016,9:53 AM.

## 2016-01-17 NOTE — Progress Notes (Signed)
Pt refused CPT.  RT will continue to monitor as needed.

## 2016-01-17 NOTE — Progress Notes (Signed)
No new changes noted, agree with previous nurse's assessment

## 2016-01-17 NOTE — Progress Notes (Signed)
Patient C/O left sided chest pain rated it 2/10 pain level, vital 150/52, HR 69, 95%-1L, patient stated she get  the pain at times at home. Dr. Aileen Fass notified and stated to continue monitoring patient.

## 2016-01-17 NOTE — Progress Notes (Signed)
PT was not available for 1200 CPT treatment- walking in hall with PT.

## 2016-01-17 NOTE — Care Management Important Message (Signed)
Important Message  Patient Details  Name: [name] MRN: [id number] Date of Birth: 09-20-1924   Medicare Important Message Given:  Yes    Camillo Flaming 01/17/2016, 9:00 AMImportant Message  Patient Details  Name: [name] MRN: [id number] Date of Birth: 1924/07/11   Medicare Important Message Given:  Yes    Camillo Flaming 01/17/2016, 9:00 AM

## 2016-01-17 NOTE — Progress Notes (Signed)
Pt refused her CPT at this time. RT will continue to monitor as needed

## 2016-01-17 NOTE — Progress Notes (Signed)
Physical Therapy Treatment Patient Details Name: [name] MRN: [id number] DOB: 1924-07-18 Today's Date: 01/17/2016    History of Present Illness 82 yo female admitted with CAP. hx of HTN, COPD, PAfib, OA    PT Comments    Progressing with mobility. Family cannot provide 24/7 care, per chart, so recommend ST rehab at [facility]. O2 sats 84% on RA during ambulation, dyspnea 2/4.   Follow Up Recommendations  SNF     Equipment Recommendations  Rolling walker with 5" wheels    Recommendations for Other Services       Precautions / Restrictions Precautions Precautions: Fall Precaution Comments: monitor sats Restrictions Weight Bearing Restrictions: No    Mobility  Bed Mobility   Bed Mobility: Supine to Sit;Sit to Supine     Supine to sit: Supervision Sit to supine: Supervision   General bed mobility comments: for safety  Transfers Overall transfer level: Needs assistance   Transfers: Sit to/from Stand Sit to Stand: Min guard Stand pivot transfers: Min guard       General transfer comment: close guard for safety.   Ambulation/Gait Ambulation/Gait assistance: Min guard Ambulation Distance (Feet): 60 Feet Assistive device: Rolling walker (2 wheeled) Gait Pattern/deviations: Step-through pattern     General Gait Details: close guard for safety. O2 sats 84% on RA during ambulation. dyspnea 2/4   Stairs            Wheelchair Mobility    Modified Rankin (Stroke Patients Only)       Balance Overall balance assessment: Needs assistance         Standing balance support: Bilateral upper extremity supported;During functional activity Standing balance-Leahy Scale: Poor                      Cognition Arousal/Alertness: Awake/alert Behavior During Therapy: WFL for tasks assessed/performed Overall Cognitive Status: Within Functional Limits for tasks assessed                      Exercises      General Comments         Pertinent Vitals/Pain Pain Assessment: No/denies pain    Home Living                      Prior Function            PT Goals (current goals can now be found in the care plan section) Progress towards PT goals: Progressing toward goals    Frequency  Min 3X/week    PT Plan Current plan remains appropriate;Discharge plan needs to be updated    Co-evaluation             End of Session Equipment Utilized During Treatment: Gait belt Activity Tolerance: Patient tolerated treatment well Patient left: in bed;with call bell/phone within reach;with bed alarm set     Time: [id number] PT Time Calculation (min) (ACUTE ONLY): 20 min  Charges:  $Gait Training: 8-22 mins                    G Codes:      Weston Anna, MPT Pager: [phone]

## 2016-01-17 NOTE — Progress Notes (Signed)
[facility] PROGRESS NOTE    Progress Note   LARENDA FEAST [id number] DOB: [birth year] DOA: 01/14/2016 PCP: Merrilee Seashore, MD   Brief Narrative:   [name] is an 82 y.o. female past medical history of hypertension COPD oxygen dependent paroxysmal atrial fibrillation and recently treated for influenza no presents to the ED with complaints of cough dyspnea and a poor appetite. She relates she has been unable to sleep.  Assessment/Plan:   Acute on chronic hypoxic respiratory failure due to Left lower lobe CAP (community acquired pneumonia) and COPD exacerbation: Secondary infection post viral. Start empirically on IV Rocephin and azithromycin, transition to oral antibiotics on 01/17/2016.  Her cultures remain negative to date, HIV negative. Will need a follow-up x-ray in 2-4 weeks. physical therapy evaluated the patient requested skilled nursing facility temporarily.   COPD exacerbation: Probably precipitated by the primary pulmonary infection Taper down steroids.  Tobacco abuse: Counseling was done.  Essential hypertension: Continue current home regimen. Blood pressure at goal.  Paroxysmal atrial fibrillation [facility]): Continue verapamil and aspirin, not a candidate for anticoagulation due to advanced age and fall risk.   DVT Prophylaxis - Lovenox ordered.  Family Communication: daughter Disposition Plan: SNF in am Code Status:     Code Status Orders        Start     Ordered   01/14/16 1730  Full code   Continuous     01/14/16 1729    Code Status History    Date Active Date Inactive Code Status Order ID Comments User Context   This patient has a current code status but no historical code status.        IV Access:    Peripheral IV   Procedures and diagnostic studies:   Dg Chest 2 View  01/17/2016  CLINICAL DATA:  Shortness of breath.  Pneumonia. EXAM: CHEST  2 VIEW COMPARISON:  01/14/2016 FINDINGS: Chronic interstitial coarsening  from bronchitic markings and emphysema based on 2006 chest CT. Interstitial coarsening above baseline with small but increased bilateral pleural effusion. There is asymmetric hazy density at the left base. Chronic cardiomegaly with stable aortic and hilar contours. Stable biapical pleural thickening. IMPRESSION: CHF pattern.History of pneumonia which could be superimposed at the left base. Electronically Signed   By: Monte Fantasia M.D.   On: 01/17/2016 08:49     Medical Consultants:    None.  Anti-Infectives:   Anti-infectives    Start     Dose/Rate Route Frequency Ordered Stop   01/15/16 1700  azithromycin (ZITHROMAX) tablet 500 mg     500 mg Oral Every 24 hours 01/14/16 1729 01/22/16 1659   01/15/16 1500  cefTRIAXone (ROCEPHIN) 1 g in dextrose 5 % 50 mL IVPB     1 g 100 mL/hr over 30 Minutes Intravenous Every 24 hours 01/14/16 1729 01/22/16 1459   01/14/16 1400  cefTRIAXone (ROCEPHIN) 1 g in dextrose 5 % 50 mL IVPB     1 g 100 mL/hr over 30 Minutes Intravenous  Once 01/14/16 1347 01/14/16 1538   01/14/16 1400  azithromycin (ZITHROMAX) 500 mg in dextrose 5 % 250 mL IVPB     500 mg 250 mL/hr over 60 Minutes Intravenous  Once 01/14/16 1347 01/14/16 1815      Subjective:    [name]  she will she continues to have a productive cough. Her breathing is improved.   Objective:    Filed Vitals:   01/16/16 2024 01/16/16 2056 01/17/16 0620 01/17/16 0755  BP:  143/58 142/54   Pulse:  76 65   Temp:  97.5 F (36.4 C) 97.2 F (36.2 C)   TempSrc:  Oral Oral   Resp:  22 22   Height:      Weight:   68.9 kg (151 lb 14.4 oz)   SpO2: 92% 93% 98% 94%    Intake/Output Summary (Last 24 hours) at 01/17/16 0933 Last data filed at 01/17/16 0740  Gross per 24 hour  Intake   1874 ml  Output   1100 ml  Net    774 ml   Filed Weights   01/15/16 0600 01/16/16 0550 01/17/16 0620  Weight: 69.1 kg (152 lb 5.4 oz) 69 kg (152 lb 1.9 oz) 68.9 kg (151 lb 14.4 oz)    Exam: Gen:   NAD Cardiovascular:  RRR, No M/R/G Chest and lungs:   CTAB Abdomen:  Abdomen soft, NT/ND, + BS Extremities:  No edema   Data Reviewed:    Labs: Basic Metabolic Panel:  Recent Labs Lab 01/14/16 1315 01/15/16 0542 01/16/16 0527 01/17/16 0454  NA 143 146* 146* 144  K 3.8 4.0 4.4 4.2  CL 103 106 106 104  CO2 31 32 32 33*  GLUCOSE 110* 149* 156* 111*  BUN 15 18 26* 28*  CREATININE 0.53 0.48 0.49 0.56  CALCIUM 8.7* 8.8* 8.9 8.7*   GFR Estimated Creatinine Clearance: 47.3 mL/min (by C-G formula based on Cr of 0.56). Liver Function Tests: No results for input(s): AST, ALT, ALKPHOS, BILITOT, PROT, ALBUMIN in the last 168 hours. No results for input(s): LIPASE, AMYLASE in the last 168 hours. No results for input(s): AMMONIA in the last 168 hours. Coagulation profile No results for input(s): INR, PROTIME in the last 168 hours.  CBC:  Recent Labs Lab 01/14/16 1315 01/15/16 0542 01/16/16 0527 01/17/16 0454  WBC 21.3* 19.3* 18.2* 15.7*  NEUTROABS 18.0*  --   --   --   HGB 11.2* 10.8* 10.4* 10.2*  HCT 37.9 36.3 35.2* 33.8*  MCV 90.2 91.2 91.0 88.3  PLT 416* 383 441* 451*   Cardiac Enzymes: No results for input(s): CKTOTAL, CKMB, CKMBINDEX, TROPONINI in the last 168 hours. BNP (last 3 results) No results for input(s): PROBNP in the last 8760 hours. CBG: No results for input(s): GLUCAP in the last 168 hours. D-Dimer: No results for input(s): DDIMER in the last 72 hours. Hgb A1c: No results for input(s): HGBA1C in the last 72 hours. Lipid Profile: No results for input(s): CHOL, HDL, LDLCALC, TRIG, CHOLHDL, LDLDIRECT in the last 72 hours. Thyroid function studies: No results for input(s): TSH, T4TOTAL, T3FREE, THYROIDAB in the last 72 hours.  Invalid input(s): FREET3 Anemia work up: No results for input(s): VITAMINB12, FOLATE, FERRITIN, TIBC, IRON, RETICCTPCT in the last 72 hours. Sepsis Labs:  Recent Labs Lab 01/14/16 1315 01/14/16 1327 01/15/16 0542  01/16/16 0527 01/17/16 0454  WBC 21.3*  --  19.3* 18.2* 15.7*  LATICACIDVEN  --  0.69  --   --   --    Microbiology Recent Results (from the past 240 hour(s))  Culture, blood (routine x 2) Call MD if unable to obtain prior to antibiotics being given     Status: None (Preliminary result)   Collection Time: 01/14/16  7:14 PM  Result Value Ref Range Status   Specimen Description BLOOD RIGHT ANTECUBITAL  Final   Special Requests BOTTLES DRAWN AEROBIC AND ANAEROBIC 5ML EA  Final   Culture   Final    NO GROWTH 2 DAYS Performed  at [facility]    Report Status PENDING  Incomplete  Culture, blood (routine x 2) Call MD if unable to obtain prior to antibiotics being given     Status: None (Preliminary result)   Collection Time: 01/14/16  7:14 PM  Result Value Ref Range Status   Specimen Description BLOOD RIGHT ARM  Final   Special Requests BOTTLES DRAWN AEROBIC ONLY 5 ML  Final   Culture   Final    NO GROWTH 2 DAYS Performed at [facility]    Report Status PENDING  Incomplete     Medications:   . aspirin  325 mg Oral Daily  . atorvastatin  40 mg Oral QODAY  . azithromycin  500 mg Oral Q24H  . captopril  50 mg Oral BID  . cefTRIAXone (ROCEPHIN)  IV  1 g Intravenous Q24H  . enoxaparin (LOVENOX) injection  40 mg Subcutaneous Q24H  . free water  200 mL Oral Q4H  . guaiFENesin  1,200 mg Oral BID  . ipratropium-albuterol  3 mL Nebulization TID  . metoprolol succinate  25 mg Oral QHS  . metoprolol succinate  50 mg Oral Daily  . omega-3 acid ethyl esters  1 g Oral BID  . predniSONE  30 mg Oral Q breakfast  . sodium chloride flush  3 mL Intravenous Q12H  . verapamil  240 mg Oral Daily   Continuous Infusions:   Time spent: 15 min   LOS: 3 days   Charlynne Cousins  [facility] Pager [phone]  *Please refer to [email or web address], password [email or web address] to get updated schedule on who will round on this patient, as hospitalists switch teams weekly. If 7PM-7AM, please  contact night-coverage at [email or web address], password [email or web address] for any overnight needs.  01/17/2016, 9:33 AM

## 2016-01-18 DIAGNOSIS — J9691 Respiratory failure, unspecified with hypoxia: Secondary | ICD-10-CM | POA: Diagnosis not present

## 2016-01-18 DIAGNOSIS — J449 Chronic obstructive pulmonary disease, unspecified: Secondary | ICD-10-CM | POA: Diagnosis not present

## 2016-01-18 DIAGNOSIS — R079 Chest pain, unspecified: Secondary | ICD-10-CM | POA: Diagnosis not present

## 2016-01-18 DIAGNOSIS — E785 Hyperlipidemia, unspecified: Secondary | ICD-10-CM | POA: Diagnosis not present

## 2016-01-18 DIAGNOSIS — J189 Pneumonia, unspecified organism: Secondary | ICD-10-CM | POA: Diagnosis not present

## 2016-01-18 DIAGNOSIS — J441 Chronic obstructive pulmonary disease with (acute) exacerbation: Secondary | ICD-10-CM | POA: Diagnosis not present

## 2016-01-18 DIAGNOSIS — J410 Simple chronic bronchitis: Secondary | ICD-10-CM | POA: Diagnosis not present

## 2016-01-18 DIAGNOSIS — Z8249 Family history of ischemic heart disease and other diseases of the circulatory system: Secondary | ICD-10-CM | POA: Diagnosis not present

## 2016-01-18 DIAGNOSIS — L8995 Pressure ulcer of unspecified site, unstageable: Secondary | ICD-10-CM | POA: Diagnosis not present

## 2016-01-18 DIAGNOSIS — J9621 Acute and chronic respiratory failure with hypoxia: Secondary | ICD-10-CM | POA: Diagnosis not present

## 2016-01-18 DIAGNOSIS — I10 Essential (primary) hypertension: Secondary | ICD-10-CM | POA: Diagnosis not present

## 2016-01-18 DIAGNOSIS — Z720 Tobacco use: Secondary | ICD-10-CM | POA: Diagnosis not present

## 2016-01-18 DIAGNOSIS — L89152 Pressure ulcer of sacral region, stage 2: Secondary | ICD-10-CM | POA: Diagnosis not present

## 2016-01-18 DIAGNOSIS — I480 Paroxysmal atrial fibrillation: Secondary | ICD-10-CM | POA: Diagnosis not present

## 2016-01-18 DIAGNOSIS — F322 Major depressive disorder, single episode, severe without psychotic features: Secondary | ICD-10-CM | POA: Diagnosis not present

## 2016-01-18 DIAGNOSIS — R0600 Dyspnea, unspecified: Secondary | ICD-10-CM | POA: Diagnosis not present

## 2016-01-18 DIAGNOSIS — F321 Major depressive disorder, single episode, moderate: Secondary | ICD-10-CM | POA: Diagnosis not present

## 2016-01-18 DIAGNOSIS — D72829 Elevated white blood cell count, unspecified: Secondary | ICD-10-CM | POA: Diagnosis not present

## 2016-01-18 MED ORDER — PREDNISONE 10 MG PO TABS: ORAL_TABLET | ORAL | Status: DC

## 2016-01-18 MED ORDER — LEVOFLOXACIN 750 MG PO TABS: 750.0000 mg | ORAL_TABLET | ORAL | Status: DC

## 2016-01-18 MED ORDER — METOPROLOL SUCCINATE ER 50 MG PO TB24: 50.0000 mg | ORAL_TABLET | Freq: Every day | ORAL | Status: DC

## 2016-01-18 NOTE — Consult Note (Signed)
   [facility] CM Inpatient Consult   01/18/2016  BURLEY BARTOLUCCI 03/26/1924 [id number]   [facility] Care Management follow up. Confirmed with inpatient Licensed CSW that patient is going to SNF at discharge. [facility] Care Management not needed at this time.   Marthenia Rolling, MSN-Ed, RN,BSN [facility] Liaison [phone]

## 2016-01-18 NOTE — Discharge Summary (Signed)
Physician Discharge Summary  [name] [id number] DOB: 1924/03/06 DOA: 01/14/2016  PCP: Merrilee Seashore, MD  Admit date: 01/14/2016 Discharge date: 01/18/2016  Time spent: 35 minutes  Recommendations for Outpatient Follow-up:  1. Follow up with PCP in 2-4 weeks. 2. Follow-up chest x-ray in 4 weeks.   Discharge Diagnoses:  Principal Problem:   CAP (community acquired pneumonia) Active Problems:   Paroxysmal atrial fibrillation (HCC)   Tobacco abuse   Hyperlipidemia   Essential hypertension   COPD exacerbation (HCC)   Acute on chronic respiratory failure with hypoxia (HCC)   Leukocytosis   Community acquired pneumonia   Pressure ulcer   Discharge Condition: stable  Diet recommendation: regular  Filed Weights   01/16/16 0550 01/17/16 0620 01/18/16 0552  Weight: 69 kg (152 lb 1.9 oz) 68.9 kg (151 lb 14.4 oz) 67.813 kg (149 lb 8 oz)    History of present illness:  82 y.o. female , single, lives alone but for the last 3 weeks has been living with her oldest son, ambulates with the help of a walker, PMH of HTN, HLD, COPD on home oxygen 2 L/m at bedtime, ongoing tobacco abuse, PAF, LBBB, recently treated influenza, presented to ED on 01/14/16 with complaints of ongoing cough, dyspnea, poor appetite, unable to sleep and sweats. Patient was in her usual state of health until approximately 3 weeks ago. 2 weeks back she was treated for an episode of UTI with antibiotics. Subsequently she started having complaints of cough and dyspnea and was seen in urgent care on 01/09/16 and treated for flu and completed a course of Tamiflu on 01/13/16  Hospital Course:  Acute on chronic hypoxic respiratory failure due to Left lower lobe CAP (community acquired pneumonia) and COPD exacerbation: Secondary infection post viral. Start empirically on IV Rocephin and azithromycin, transition to oral antibiotics on 01/17/2016, she will cont oral levaquin for 4 days. Her cultures remain negative to  date, HIV negative. Will need a follow-up x-ray in 2-4 weeks. physical therapy evaluated the patient requested skilled nursing facility temporarily.  COPD exacerbation: Probably precipitated by the primary pulmonary infection Continue inhalers and steroid taper.  Tobacco abuse: Counseling was done.  Essential hypertension: Continue current home regimen. Blood pressure at goal.  Paroxysmal atrial fibrillation [facility]): Continue verapamil and aspirin, not a candidate for anticoagulation due to advanced age and fall risk.    Procedures:  CXR  Consultations:  none  Discharge Exam: Filed Vitals:   01/18/16 0552 01/18/16 1003  BP: 161/70 139/52  Pulse: 76 84  Temp: 98.2 F (36.8 C)   Resp: 20     General: A&O x3 Cardiovascular: RRR Respiratory: good air movement CTA B/L  Discharge Instructions   Discharge Instructions    Diet - low sodium heart healthy    Complete by:  As directed      Increase activity slowly    Complete by:  As directed           Current Discharge Medication List    START taking these medications   Details  levofloxacin (LEVAQUIN) 750 MG tablet Take 1 tablet (750 mg total) by mouth every other day. Qty: 4 tablet, Refills: 0    predniSONE (DELTASONE) 10 MG tablet Takes 6 tablets for 1 days, then 5 tablets for 1 days, then 4 tablets for 1 days, then 3 tablets for 1 days, then 2 tabs for 1 days, then 1 tab for 1 days, and then stop. Qty: 21 tablet, Refills: 0  CONTINUE these medications which have CHANGED   Details  metoprolol succinate (TOPROL-XL) 50 MG 24 hr tablet Take 1 tablet (50 mg total) by mouth daily. Take with or immediately following a meal. Qty: 30 tablet, Refills: 0      CONTINUE these medications which have NOT CHANGED   Details  acetaminophen (TYLENOL) 500 MG tablet Take 1,000 mg by mouth daily as needed for mild pain.    albuterol (PROVENTIL HFA;VENTOLIN HFA) 108 (90 BASE) MCG/ACT inhaler Inhale 2 puffs into the lungs  every 6 (six) hours as needed.    aspirin 325 MG tablet Take 325 mg by mouth daily.    atorvastatin (LIPITOR) 40 MG tablet Take 40 mg by mouth every other day. Every other day    captopril (CAPOTEN) 50 MG tablet Take 50 mg by mouth 2 (two) times daily.     diazepam (VALIUM) 5 MG tablet Take 1 tablet by mouth daily as needed for anxiety.     fish oil-omega-3 fatty acids 1000 MG capsule Take 1 g by mouth 2 (two) times daily.     meclizine (ANTIVERT) 12.5 MG tablet Take 1 tablet (12.5 mg total) by mouth 3 (three) times daily. Qty: 21 tablet, Refills: 0    OXYGEN Inhale 2 L into the lungs at bedtime. Reported on 12/04/2015    verapamil (COVERA HS) 240 MG (CO) 24 hr tablet Take 240 mg by mouth daily.     ipratropium (ATROVENT) 0.02 % nebulizer solution Take 1.25 mLs (250 mcg total) by nebulization 4 (four) times daily. Qty: 75 mL, Refills: 12   Associated Diagnoses: COPD (chronic obstructive pulmonary disease) (HCC)      STOP taking these medications     albuterol (PROVENTIL) (2.5 MG/3ML) 0.083% nebulizer solution      Heat Wraps (THERMACARE BACK/HIP) MISC        Allergies  Allergen Reactions  . Cefuroxime Nausea And Vomiting   Follow-up Information    Follow up with [facility], [place].   Specialty:  Internal Medicine   Contact information:   [address] [place] [address] [phone]        The results of significant diagnostics from this hospitalization (including imaging, microbiology, ancillary and laboratory) are listed below for reference.    Significant Diagnostic Studies: Dg Chest 2 View  01/17/2016  CLINICAL DATA:  Shortness of breath.  Pneumonia. EXAM: CHEST  2 VIEW COMPARISON:  01/14/2016 FINDINGS: Chronic interstitial coarsening from bronchitic markings and emphysema based on 2006 chest CT. Interstitial coarsening above baseline with small but increased bilateral pleural effusion. There is asymmetric hazy density at the left base.  Chronic cardiomegaly with stable aortic and hilar contours. Stable biapical pleural thickening. IMPRESSION: CHF pattern.History of pneumonia which could be superimposed at the left base. Electronically Signed   By: Monte Fantasia M.D.   On: 01/17/2016 08:49   Dg Chest 2 View  01/14/2016  CLINICAL DATA:  Cough, shortness of breath, nausea.  Flu. EXAM: CHEST  2 VIEW COMPARISON:  07/27/2015 FINDINGS: Increased interstitial markings, some of which may be chronic, although mild interstitial edema is possible. Patchy left lower lobe opacity, possibly atelectasis, pneumonia not excluded. Suspected small left pleural effusion. Cardiomegaly. IMPRESSION: Cardiomegaly with possible mild interstitial edema. Patchy left lower lobe opacity, atelectasis versus pneumonia. Suspected small left pleural effusion. Electronically Signed   By: Julian Hy M.D.   On: 01/14/2016 13:34    Microbiology: Recent Results (from the past 240 hour(s))  Culture, blood (routine x 2) Call  MD if unable to obtain prior to antibiotics being given     Status: None (Preliminary result)   Collection Time: 01/14/16  7:14 PM  Result Value Ref Range Status   Specimen Description BLOOD RIGHT ANTECUBITAL  Final   Special Requests BOTTLES DRAWN AEROBIC AND ANAEROBIC 5ML EA  Final   Culture   Final    NO GROWTH 3 DAYS Performed at [facility]    Report Status PENDING  Incomplete  Culture, blood (routine x 2) Call MD if unable to obtain prior to antibiotics being given     Status: None (Preliminary result)   Collection Time: 01/14/16  7:14 PM  Result Value Ref Range Status   Specimen Description BLOOD RIGHT ARM  Final   Special Requests BOTTLES DRAWN AEROBIC ONLY 5 ML  Final   Culture   Final    NO GROWTH 3 DAYS Performed at [facility]    Report Status PENDING  Incomplete     Labs: Basic Metabolic Panel:  Recent Labs Lab 01/14/16 1315 01/15/16 0542 01/16/16 0527 01/17/16 0454  NA 143 146* 146* 144  K  3.8 4.0 4.4 4.2  CL 103 106 106 104  CO2 31 32 32 33*  GLUCOSE 110* 149* 156* 111*  BUN 15 18 26* 28*  CREATININE 0.53 0.48 0.49 0.56  CALCIUM 8.7* 8.8* 8.9 8.7*   Liver Function Tests: No results for input(s): AST, ALT, ALKPHOS, BILITOT, PROT, ALBUMIN in the last 168 hours. No results for input(s): LIPASE, AMYLASE in the last 168 hours. No results for input(s): AMMONIA in the last 168 hours. CBC:  Recent Labs Lab 01/14/16 1315 01/15/16 0542 01/16/16 0527 01/17/16 0454  WBC 21.3* 19.3* 18.2* 15.7*  NEUTROABS 18.0*  --   --   --   HGB 11.2* 10.8* 10.4* 10.2*  HCT 37.9 36.3 35.2* 33.8*  MCV 90.2 91.2 91.0 88.3  PLT 416* 383 441* 451*   Cardiac Enzymes: No results for input(s): CKTOTAL, CKMB, CKMBINDEX, TROPONINI in the last 168 hours. BNP: BNP (last 3 results) No results for input(s): BNP in the last 8760 hours.  ProBNP (last 3 results) No results for input(s): PROBNP in the last 8760 hours.  CBG: No results for input(s): GLUCAP in the last 168 hours.   Signed:  Charlynne Cousins MD.  [facility] 01/18/2016, 11:52 AM

## 2016-01-18 NOTE — Progress Notes (Signed)
SATURATION QUALIFICATIONS: (This note is used to comply with regulatory documentation for home oxygen)  Patient Saturations on Room Air at Rest =88 %  Patient Saturations on Room Air while Ambulating = 84 %  Patient Saturations on 2 Liters of oxygen while Ambulating = 90%  Please briefly explain why patient needs home oxygen: 

## 2016-01-18 NOTE — Clinical Social Work Placement (Signed)
Patient is set to discharge to [facility] today. Patient & son, [name] at bedside made aware. Discharge packet given to RN, Anderson Malta. [facility] called for transport.     Raynaldo Opitz, [facility] Clinical Social Worker cell #: [phone]    CLINICAL SOCIAL WORK PLACEMENT  NOTE  Date:  01/18/2016  Patient Details  Name: [name] MRN: [id number] Date of Birth: 12-29-1923  Clinical Social Work is seeking post-discharge placement for this patient at the [place] level of care (*CSW will initial, date and re-position this form in  chart as items are completed):  Yes   Patient/family provided with [place] Work Department's list of facilities offering this level of care within the geographic area requested by the patient (or if unable, by the patient's family).  Yes   Patient/family informed of their freedom to choose among providers that offer the needed level of care, that participate in Medicare, Medicaid or managed care program needed by the patient, have an available bed and are willing to accept the patient.  Yes   Patient/family informed of [facility]'s ownership interest in [facility] and [facility], as well as of the fact that they are under no obligation to receive care at these facilities.  PASRR submitted to EDS on 01/16/16     PASRR number received on 01/16/16     Existing PASRR number confirmed on       FL2 transmitted to all facilities in geographic area requested by pt/family on 01/16/16     FL2 transmitted to all facilities within larger geographic area on       Patient informed that his/her managed care company has contracts with or will negotiate with certain facilities, including the following:        Yes   Patient/family informed of bed offers received.  Patient chooses bed at [facility]     Physician recommends and patient chooses bed at      Patient to be transferred to [place]  on 01/18/16.  Patient to be transferred to facility by [facility]     Patient family notified on 01/18/16 of transfer.  Name of family member notified:  patient's son, [name] via phone     PHYSICIAN       Additional Comment:    _______________________________________________ Standley Brooking, LCSW 01/18/2016, 1:46 PM

## 2016-01-18 NOTE — Care Management Note (Signed)
Case Management Note  Patient Details  Name: [name] MRN: [id number] Date of Birth: 07-11-1924  Subjective/Objective:                    Action/Plan:d/c SNF.   Expected Discharge Date:                  Expected Discharge Plan:  [place]  In-House Referral:     Discharge planning Services  CM Consult  Post Acute Care Choice:    Choice offered to:     DME Arranged:    DME Agency:     HH Arranged:    [place] Agency:     Status of Service:  Completed, signed off  Medicare Important Message Given:  Yes Date Medicare IM Given:    Medicare IM give by:    Date Additional Medicare IM Given:    Additional Medicare Important Message give by:     If discussed at [place] of Stay Meetings, dates discussed:    Additional Comments:  Dessa Phi, RN 01/18/2016, 11:45 AM

## 2016-01-18 NOTE — Progress Notes (Signed)
Report called to [facility] RN accepting report for the facility. VSS and no acute changes noted in Pt's assessment at time of transfer.

## 2016-01-18 NOTE — Progress Notes (Signed)
Physical Therapy Treatment Patient Details Name: TILER FORGY MRN: [id number] DOB: [birth year] Today's Date: 01/18/2016   SATURATION QUALIFICATIONS: (This note is used to comply with regulatory documentation for home oxygen)  Patient Saturations on Room Air while Ambulating = 84%  Patient Saturations on 2 Liters of oxygen while Ambulating = 90%   History of Present Illness 82 yo female admitted with CAP. hx of HTN, COPD, PAfib, OA    PT Comments    Pt participated well with session.   Follow Up Recommendations  SNF     Equipment Recommendations  Rolling walker with 5" wheels    Recommendations for Other Services       Precautions / Restrictions Precautions Precautions: Fall Precaution Comments: monitor sats Restrictions Weight Bearing Restrictions: No    Mobility  Bed Mobility Overal bed mobility: Needs Assistance Bed Mobility: Supine to Sit;Sit to Supine     Supine to sit: Supervision Sit to supine: Supervision   General bed mobility comments: for safety  Transfers Overall transfer level: Needs assistance Equipment used: Rolling walker (2 wheeled) Transfers: Sit to/from Stand Sit to Stand: Min guard Stand pivot transfers: Min guard       General transfer comment: close guard for safety.   Ambulation/Gait Ambulation/Gait assistance: Min guard Ambulation Distance (Feet): 60 Feet Assistive device: Rolling walker (2 wheeled) Gait Pattern/deviations: Step-through pattern     General Gait Details: close guard for safety. O2 sats 84% on RA during ambulation, 90% on 2L. dyspnea 2/4   Stairs            Wheelchair Mobility    Modified Rankin (Stroke Patients Only)       Balance           Standing balance support: During functional activity Standing balance-Leahy Scale: Poor                      Cognition Arousal/Alertness: Awake/alert Behavior During Therapy: WFL for tasks assessed/performed Overall Cognitive Status: Within  Functional Limits for tasks assessed                      Exercises      General Comments        Pertinent Vitals/Pain Pain Assessment: No/denies pain    Home Living                      Prior Function            PT Goals (current goals can now be found in the care plan section) Progress towards PT goals: Progressing toward goals    Frequency  Min 3X/week    PT Plan Current plan remains appropriate    Co-evaluation             End of Session Equipment Utilized During Treatment: Gait belt Activity Tolerance: Patient tolerated treatment well Patient left: in bed;with call bell/phone within reach;with family/visitor present     Time: [id number] PT Time Calculation (min) (ACUTE ONLY): 18 min  Charges:  $Gait Training: 8-22 mins                    G Codes:      Weston Anna, MPT Pager: [phone]

## 2016-01-19 ENCOUNTER — Encounter: Payer: Self-pay | Admitting: Internal Medicine

## 2016-01-19 ENCOUNTER — Non-Acute Institutional Stay (SKILLED_NURSING_FACILITY): Payer: Medicare Other | Admitting: Internal Medicine

## 2016-01-19 ENCOUNTER — Other Ambulatory Visit: Payer: Self-pay | Admitting: *Deleted

## 2016-01-19 DIAGNOSIS — L8990 Pressure ulcer of unspecified site, unspecified stage: Secondary | ICD-10-CM

## 2016-01-19 DIAGNOSIS — I480 Paroxysmal atrial fibrillation: Secondary | ICD-10-CM | POA: Diagnosis not present

## 2016-01-19 DIAGNOSIS — H9193 Unspecified hearing loss, bilateral: Secondary | ICD-10-CM | POA: Diagnosis not present

## 2016-01-19 DIAGNOSIS — I10 Essential (primary) hypertension: Secondary | ICD-10-CM | POA: Diagnosis not present

## 2016-01-19 DIAGNOSIS — J189 Pneumonia, unspecified organism: Secondary | ICD-10-CM | POA: Diagnosis not present

## 2016-01-19 DIAGNOSIS — Z720 Tobacco use: Secondary | ICD-10-CM

## 2016-01-19 DIAGNOSIS — K2100 Gastro-esophageal reflux disease with esophagitis, without bleeding: Secondary | ICD-10-CM

## 2016-01-19 DIAGNOSIS — R5381 Other malaise: Secondary | ICD-10-CM

## 2016-01-19 DIAGNOSIS — D649 Anemia, unspecified: Secondary | ICD-10-CM | POA: Diagnosis not present

## 2016-01-19 DIAGNOSIS — E785 Hyperlipidemia, unspecified: Secondary | ICD-10-CM

## 2016-01-19 DIAGNOSIS — J441 Chronic obstructive pulmonary disease with (acute) exacerbation: Secondary | ICD-10-CM | POA: Diagnosis not present

## 2016-01-19 DIAGNOSIS — R413 Other amnesia: Secondary | ICD-10-CM | POA: Diagnosis not present

## 2016-01-19 DIAGNOSIS — H3530 Unspecified macular degeneration: Secondary | ICD-10-CM | POA: Diagnosis not present

## 2016-01-19 DIAGNOSIS — R739 Hyperglycemia, unspecified: Secondary | ICD-10-CM

## 2016-01-19 LAB — CULTURE, BLOOD (ROUTINE X 2)
CULTURE: NO GROWTH
Culture: NO GROWTH

## 2016-01-19 MED ORDER — DIAZEPAM 5 MG PO TABS: 5.0000 mg | ORAL_TABLET | Freq: Every day | ORAL | Status: DC | PRN

## 2016-01-19 NOTE — Telephone Encounter (Signed)
[facility] 

## 2016-01-19 NOTE — Progress Notes (Signed)
Patient ID: [name], female   DOB: 07-29-1924, 82 y.o.   MRN: [id number]  Provider:  Jeanmarie Hubert, M.D. Location:  [place] Room Number: 414A Place of Service:  SNF (31)  PCP: Estill Dooms, MD Patient Care Team: Estill Dooms, MD as PCP - General (Internal Medicine)  Extended Emergency Contact Information Primary Emergency Contact: Hedrick,Gary Address: [address].          [place], [place] [address] [place] Phone: [phone] Mobile Phone: [phone] Relation: Son  Code Status: Full Goals of Care: Advanced Directive information Advanced Directives 01/19/2016  Does patient have an advance directive? Yes  Copy of advanced directive(s) in chart? No - copy requested  Would patient like information on creating an advanced directive? -      Chief Complaint  Patient presents with  . New Admit To SNF    following hospitalization 01/14/16 to 01/18/16 for community acquired pneumonia    HPI: Patient is a 82 y.o. female seen today for admission to [facility] on 01/18/2016 following hospitalization from 01/14/2016 through 01/18/2016. Patient was diagnosis community-acquired pneumonia following presentation to the emergency room with cough, leukocytosis, dyspnea, sweats, and left lower lobe infiltrate on chest x-ray. The pneumonia was treated with Rocephin and azithromycin initially. She has been changed to levofloxacin orally for a few more doses. Prior to the diagnosis of pneumonia, she had been treated with Tamiflu for having possible influenza between 01/09/2016 and 01/13/2016.  Additional diagnoses include paroxysmal atrial fibrillation (currently in normal sinus rhythm), hypertension, hyperlipidemia, left bundle branch block, tobacco abuse (one pack per day), COPD, anemia, pressure ulcer at the sacrum, hyperglycemia while hospitalized, partial deafness, macular degeneration, and mild memory  loss.  Patient is quite weak because of her recent respiratory illness. She lives with her son. He is retired.  Past Medical History  Diagnosis Date  . Paroxysmal atrial fibrillation (HCC)   . Left bundle branch block   . Hypertension   . Hyperlipidemia   . Chest pain   . Tobacco abuse   . COPD (chronic obstructive pulmonary disease) ([place])   . Personal history of subdural hematoma   . Cataract    Past Surgical History  Procedure Laterality Date  . Eye surgery    . Abdominal hysterectomy    . Appendectomy    . Brain surgery      reports that she has been smoking Cigarettes.  She started smoking about 70 years ago. She has a 73 pack-year smoking history. She has never used smokeless tobacco. She reports that she does not drink alcohol or use illicit drugs. Social History   Social History  . Marital Status: Divorced    Spouse Name: N/A  . Number of Children: N/A  . Years of Education: N/A   Occupational History  . Not on file.   Social History Main Topics  . Smoking status: Current Every Day Smoker -- 1.00 packs/day for 73 years    Types: Cigarettes    Start date: 07/30/1945  . Smokeless tobacco: Never Used  . Alcohol Use: No  . Drug Use: No  . Sexual Activity: Not on file   Other Topics Concern  . Not on file   Social History Narrative    Functional Status Survey:    Family History  Problem Relation Age of Onset  . Diabetes Mother   . Heart disease Mother   . Heart disease Father  Health Maintenance  Topic Date Due  . TETANUS/TDAP  03/02/1945  . ZOSTAVAX  03/02/1986  . DEXA SCAN  03/03/1991  . PNA vac Low Risk Adult (1 of 2 - PCV13) 03/03/1991  . INFLUENZA VACCINE  05/22/2015    Allergies  Allergen Reactions  . Cefuroxime Nausea And Vomiting      Medication List       This list is accurate as of: 01/19/16 10:10 AM.  Always use your most recent med list.               acetaminophen 500 MG tablet  Commonly known as:  TYLENOL  Take  1,000 mg by mouth daily as needed for mild pain.     albuterol 108 (90 Base) MCG/ACT inhaler  Commonly known as:  PROVENTIL HFA;VENTOLIN HFA  Inhale 2 puffs into the lungs every 6 (six) hours as needed.     aspirin 325 MG tablet  Take 325 mg by mouth daily.     atorvastatin 40 MG tablet  Commonly known as:  LIPITOR  Take 40 mg by mouth every other day. Every other day     captopril 50 MG tablet  Commonly known as:  CAPOTEN  Take 50 mg by mouth. Take one tablet twice daily     diazepam 5 MG tablet  Commonly known as:  VALIUM  Take 1 tablet by mouth daily as needed for anxiety.     fish oil-omega-3 fatty acids 1000 MG capsule  Take 1 g by mouth 2 (two) times daily.     ipratropium 0.02 % nebulizer solution  Commonly known as:  ATROVENT  Take 1.25 mLs (250 mcg total) by nebulization 4 (four) times daily.     levofloxacin 750 MG tablet  Commonly known as:  LEVAQUIN  Take 1 tablet (750 mg total) by mouth every other day.     meclizine 12.5 MG tablet  Commonly known as:  ANTIVERT  Take 1 tablet (12.5 mg total) by mouth 3 (three) times daily.     metoprolol succinate 50 MG 24 hr tablet  Commonly known as:  TOPROL-XL  Take 1 tablet (50 mg total) by mouth daily. Take with or immediately following a meal.     OXYGEN  Inhale 2 L into the lungs at bedtime. Reported on 12/04/2015     predniSONE 10 MG tablet  Commonly known as:  DELTASONE  Takes 6 tablets for 1 days, then 5 tablets for 1 days, then 4 tablets for 1 days, then 3 tablets for 1 days, then 2 tabs for 1 days, then 1 tab for 1 days, and then stop.     verapamil 240 MG (CO) 24 hr tablet  Commonly known as:  COVERA HS  Take 240 mg by mouth daily.        Review of Systems  Constitutional: Positive for activity change and fatigue. Negative for fever, chills, diaphoresis, appetite change and unexpected weight change.  HENT: Positive for hearing loss. Negative for congestion, ear discharge, ear pain, postnasal drip,  rhinorrhea, sore throat, tinnitus, trouble swallowing and voice change.   Eyes: Positive for visual disturbance (Macular degeneration with visual loss). Negative for pain, redness and itching.  Respiratory: Positive for shortness of breath. Negative for cough, choking and wheezing.   Cardiovascular: Negative for chest pain, palpitations and leg swelling.  Gastrointestinal: Negative for nausea, abdominal pain, diarrhea, constipation and abdominal distention.  Endocrine: Negative for cold intolerance, heat intolerance, polydipsia, polyphagia and polyuria.  Hyperglycemia while hospitalized and on steroids  Genitourinary: Negative for dysuria, urgency, frequency, hematuria, flank pain, vaginal discharge, difficulty urinating and pelvic pain.  Musculoskeletal: Negative for myalgias, back pain, arthralgias, gait problem, neck pain and neck stiffness.       Generalized weakness  Skin: Negative for color change, pallor and rash.  Allergic/Immunologic: Negative.   Neurological: Negative for dizziness, tremors, seizures, syncope, weakness, numbness and headaches.  Hematological: Negative for adenopathy. Does not bruise/bleed easily.       Anemia  Psychiatric/Behavioral: Negative for suicidal ideas, hallucinations, behavioral problems, confusion, sleep disturbance, dysphoric mood and agitation. The patient is not nervous/anxious and is not hyperactive.     Filed Vitals:   01/19/16 1009  Height: 5' 7.5" (1.715 m)   There is no weight on file to calculate BMI. Physical Exam  Constitutional: She is oriented to person, place, and time. She appears well-developed and well-nourished. No distress.  Elderly and frail  HENT:  Right Ear: External ear normal.  Left Ear: External ear normal.  Nose: Nose normal.  Mouth/Throat: Oropharynx is clear and moist. No oropharyngeal exudate.  Bilateral hearing loss  Eyes: Conjunctivae and EOM are normal. Pupils are equal, round, and reactive to light. No scleral  icterus.  Prescription lenses  Neck: No JVD present. No tracheal deviation present. No thyromegaly present.  Cardiovascular: Normal rate, regular rhythm, normal heart sounds and intact distal pulses.  Exam reveals no gallop and no friction rub.   No murmur heard. Pulmonary/Chest: Effort normal. No respiratory distress. She has no wheezes. She has no rales. She exhibits no tenderness.  Oxygen supplementation  Abdominal: She exhibits no distension and no mass. There is no tenderness.  Musculoskeletal: Normal range of motion. She exhibits no edema or tenderness.  Lymphadenopathy:    She has no cervical adenopathy.  Neurological: She is alert and oriented to person, place, and time. No cranial nerve deficit. Coordination normal.  Skin: No rash noted. She is not diaphoretic. No erythema. No pallor.  Psychiatric: She has a normal mood and affect. Her behavior is normal. Judgment and thought content normal.    Labs reviewed: Basic Metabolic Panel:  Recent Labs  01/15/16 0542 01/16/16 0527 01/17/16 0454  NA 146* 146* 144  K 4.0 4.4 4.2  CL 106 106 104  CO2 32 32 33*  GLUCOSE 149* 156* 111*  BUN 18 26* 28*  CREATININE 0.48 0.49 0.56  CALCIUM 8.8* 8.9 8.7*   Liver Function Tests:  Recent Labs  12/04/15 1237  AST 14  ALT 16  ALKPHOS 82  BILITOT 0.4  PROT 6.3  ALBUMIN 3.5*   No results for input(s): LIPASE, AMYLASE in the last 8760 hours. No results for input(s): AMMONIA in the last 8760 hours. CBC:  Recent Labs  01/14/16 1315 01/15/16 0542 01/16/16 0527 01/17/16 0454  WBC 21.3* 19.3* 18.2* 15.7*  NEUTROABS 18.0*  --   --   --   HGB 11.2* 10.8* 10.4* 10.2*  HCT 37.9 36.3 35.2* 33.8*  MCV 90.2 91.2 91.0 88.3  PLT 416* 383 441* 451*   Cardiac Enzymes: No results for input(s): CKTOTAL, CKMB, CKMBINDEX, TROPONINI in the last 8760 hours. BNP: Invalid input(s): POCBNP Lab Results  Component Value Date   HGBA1C  02/21/2008    5.3 (NOTE)   The ADA recommends the  following therapeutic goals for glycemic   control related to Hgb A1C measurement:   Goal of Therapy:   < 7.0% Hgb A1C   Action Suggested:  > 8.0%  Hgb A1C   Ref:  Diabetes Care, 22, Suppl. 1, 1999   Lab Results  Component Value Date   TSH 0.246 *Test methodology is 3rd generation TSH* 02/21/2008   No results found for: VITAMINB12 No results found for: FOLATE No results found for: IRON, TIBC, FERRITIN  Imaging and Procedures obtained prior to SNF admission: Dg Chest 2 View  01/14/2016  CLINICAL DATA:  Cough, shortness of breath, nausea.  Flu. EXAM: CHEST  2 VIEW COMPARISON:  07/27/2015 FINDINGS: Increased interstitial markings, some of which may be chronic, although mild interstitial edema is possible. Patchy left lower lobe opacity, possibly atelectasis, pneumonia not excluded. Suspected small left pleural effusion. Cardiomegaly. IMPRESSION: Cardiomegaly with possible mild interstitial edema. Patchy left lower lobe opacity, atelectasis versus pneumonia. Suspected small left pleural effusion. Electronically Signed   By: Julian Hy M.D.   On: 01/14/2016 13:34    Assessment/Plan 1. CAP (community acquired pneumonia) Patient has 4 days of levofloxacin remaining. She is feeling much better.  2. Chronic obstructive pulmonary disease with acute exacerbation (HCC) Treated with steroids. She is now on a tapering course of prednisone.  3. Essential hypertension Continue metoprolol, lisinopril, and verapamil  4. Hyperlipidemia Continue Lipitor  5. Paroxysmal atrial fibrillation (HCC) Currently in normal sinus rhythm  6. Pressure ulcer Daily dressing change. Ulceration is quite shallow.  7. Tobacco abuse Patient thinks she has quit smoking now  8. Hyperglycemia Follow-up glucose. Elevations may have been related to her acute illness, use of IV and oral steroids, and IV fluids with glucose. She has no previous diagnosis of diabetes.  9. Hearing loss, bilateral Observe  10.  Macular degeneration Observe  11. Memory loss MMSE should be completed  12. Anemia -CBC,  serum iron, iron saturation, reticulocyte count -ferrous sulfate 325 mg daily  13. Debility Patient will be engaged with physical therapy and occupational therapy  14. GERD -Omeprazole 20 mg daily

## 2016-01-21 ENCOUNTER — Encounter: Payer: Self-pay | Admitting: Internal Medicine

## 2016-01-21 DIAGNOSIS — R5381 Other malaise: Secondary | ICD-10-CM

## 2016-01-21 DIAGNOSIS — H3530 Unspecified macular degeneration: Secondary | ICD-10-CM

## 2016-01-21 DIAGNOSIS — H9190 Unspecified hearing loss, unspecified ear: Secondary | ICD-10-CM

## 2016-01-21 DIAGNOSIS — R739 Hyperglycemia, unspecified: Secondary | ICD-10-CM

## 2016-01-21 DIAGNOSIS — D649 Anemia, unspecified: Secondary | ICD-10-CM | POA: Insufficient documentation

## 2016-01-21 DIAGNOSIS — R413 Other amnesia: Secondary | ICD-10-CM

## 2016-01-21 HISTORY — DX: Other malaise: R53.81

## 2016-01-21 HISTORY — DX: Unspecified hearing loss, unspecified ear: H91.90

## 2016-01-21 HISTORY — DX: Other amnesia: R41.3

## 2016-01-21 HISTORY — DX: Unspecified macular degeneration: H35.30

## 2016-01-21 HISTORY — DX: Hyperglycemia, unspecified: R73.9

## 2016-01-29 ENCOUNTER — Non-Acute Institutional Stay (SKILLED_NURSING_FACILITY): Payer: Medicare Other | Admitting: Internal Medicine

## 2016-01-29 ENCOUNTER — Encounter: Payer: Self-pay | Admitting: Internal Medicine

## 2016-01-29 DIAGNOSIS — L89152 Pressure ulcer of sacral region, stage 2: Secondary | ICD-10-CM | POA: Insufficient documentation

## 2016-01-29 DIAGNOSIS — F329 Major depressive disorder, single episode, unspecified: Secondary | ICD-10-CM

## 2016-01-29 DIAGNOSIS — F322 Major depressive disorder, single episode, severe without psychotic features: Secondary | ICD-10-CM

## 2016-01-29 HISTORY — DX: Major depressive disorder, single episode, unspecified: F32.9

## 2016-01-29 MED ORDER — SERTRALINE HCL 50 MG PO TABS: ORAL_TABLET | ORAL | Status: DC

## 2016-01-29 NOTE — Progress Notes (Signed)
Patient ID: [name], female   DOB: 03-16-1924, 82 y.o.   MRN: [id number]  Location:  [place] Room Number: [id number] of Service:  SNF (31) Provider:  Estill Dooms, MD  Patient Care Team: Estill Dooms, MD as PCP - General (Internal Medicine) Merrilee Seashore, MD as Consulting Physician (Internal Medicine)  Extended Emergency Contact Information Primary Emergency Contact: Hedrick,Gary Address: [address].          [place], [place] [address] [place] Phone: [phone] Mobile Phone: [phone] Relation: Son  Code Status:  full Goals of care: Advanced Directive information Advanced Directives 01/29/2016  Does patient have an advance directive? No  Copy of advanced directive(s) in chart? -  Would patient like information on creating an advanced directive? -     Chief Complaint  Patient presents with  . Acute Visit    area on inner left buttocks worse    HPI:  Pt is a 82 y.o. female seen today for an acute visit for evaluation of a stage II ulceration inside the left buttock crease. Current plan per the wound care nurse is to cleanse with wound cleanser and apply foam dressing every 3 days and as needed. Wound currently measures [phone] cm depth. It is mildly uncomfortable when she sits. There is no bleeding.  Second issue today is depression. Patient is becoming more overt in her remarks about how hopeless she feels.   Past Medical History  Diagnosis Date  . Paroxysmal atrial fibrillation (HCC)   . Left bundle branch block   . Hypertension   . Hyperlipidemia   . Chest pain   . Tobacco abuse   . COPD (chronic obstructive pulmonary disease) ([place])   . Personal history of subdural hematoma   . Cataract   . Subdural hematoma ([place]) 2009  . Hyperglycemia 01/21/2016  . Hearing loss 01/21/2016  . Macular degeneration 01/21/2016  . Memory loss 01/21/2016  . Debility 01/21/2016   Past Surgical History    Procedure Laterality Date  . Eye surgery    . Abdominal hysterectomy    . Appendectomy    . Brain surgery      Allergies  Allergen Reactions  . Cefuroxime Nausea And Vomiting      Medication List       This list is accurate as of: 01/29/16  3:38 PM.  Always use your most recent med list.               acetaminophen 500 MG tablet  Commonly known as:  TYLENOL  Take 1,000 mg by mouth daily as needed for mild pain.     albuterol 108 (90 Base) MCG/ACT inhaler  Commonly known as:  PROVENTIL HFA;VENTOLIN HFA  Inhale 2 puffs into the lungs every 6 (six) hours as needed.     aspirin 325 MG tablet  Take 325 mg by mouth daily.     atorvastatin 40 MG tablet  Commonly known as:  LIPITOR  Take 40 mg by mouth every other day. Every other day     captopril 50 MG tablet  Commonly known as:  CAPOTEN  Take 50 mg by mouth. Take one tablet twice daily     diazepam 5 MG tablet  Commonly known as:  VALIUM  Take 1 tablet (5 mg total) by mouth daily as needed for anxiety.     fish oil-omega-3 fatty acids 1000 MG capsule  Take 1 g by mouth 2 (two)  times daily.     ipratropium 0.02 % nebulizer solution  Commonly known as:  ATROVENT  Take 0.5 mg by nebulization 4 (four) times daily.     ipratropium 0.02 % nebulizer solution  Commonly known as:  ATROVENT  Take 1.25 mLs (250 mcg total) by nebulization 4 (four) times daily.     levofloxacin 750 MG tablet  Commonly known as:  LEVAQUIN  Take 1 tablet (750 mg total) by mouth every other day.     lisinopril 20 MG tablet  Commonly known as:  PRINIVIL,ZESTRIL  Take 20 mg by mouth daily.     meclizine 12.5 MG tablet  Commonly known as:  ANTIVERT  Take 1 tablet (12.5 mg total) by mouth 3 (three) times daily.     metoprolol succinate 50 MG 24 hr tablet  Commonly known as:  TOPROL-XL  Take 1 tablet (50 mg total) by mouth daily. Take with or immediately following a meal.     OXYGEN  Inhale 2 L into the lungs at bedtime. Reported on  12/04/2015     predniSONE 10 MG tablet  Commonly known as:  DELTASONE  Takes 6 tablets for 1 days, then 5 tablets for 1 days, then 4 tablets for 1 days, then 3 tablets for 1 days, then 2 tabs for 1 days, then 1 tab for 1 days, and then stop.     verapamil 240 MG (CO) 24 hr tablet  Commonly known as:  COVERA HS  Take 240 mg by mouth daily.        Review of Systems  Constitutional: Positive for activity change and fatigue. Negative for fever, chills, diaphoresis, appetite change and unexpected weight change.  HENT: Positive for hearing loss. Negative for congestion, ear discharge, ear pain, postnasal drip, rhinorrhea, sore throat, tinnitus, trouble swallowing and voice change.   Eyes: Positive for visual disturbance (Macular degeneration with visual loss). Negative for pain, redness and itching.  Respiratory: Positive for shortness of breath. Negative for cough, choking and wheezing.   Cardiovascular: Negative for chest pain, palpitations and leg swelling.  Gastrointestinal: Negative for nausea, abdominal pain, diarrhea, constipation and abdominal distention.  Endocrine: Negative for cold intolerance, heat intolerance, polydipsia, polyphagia and polyuria.       Hyperglycemia while hospitalized and on steroids  Genitourinary: Negative for dysuria, urgency, frequency, hematuria, flank pain, vaginal discharge, difficulty urinating and pelvic pain.  Musculoskeletal: Negative for myalgias, back pain, arthralgias, gait problem, neck pain and neck stiffness.       Generalized weakness  Skin: Negative for color change, pallor and rash.       Sacral decubitus and the inner portion of the left buttock [phone] cm. Covered with foam dressing.  Allergic/Immunologic: Negative.   Neurological: Negative for dizziness, tremors, seizures, syncope, weakness, numbness and headaches.  Hematological: Negative for adenopathy. Does not bruise/bleed easily.       Anemia  Psychiatric/Behavioral: Positive for  dysphoric mood (Verbalizes hopelessness feelings). Negative for suicidal ideas, hallucinations, behavioral problems, confusion, sleep disturbance and agitation. The patient is not nervous/anxious and is not hyperactive.      There is no immunization history on file for this patient. Pertinent  Health Maintenance Due  Topic Date Due  . DEXA SCAN  03/03/1991  . PNA vac Low Risk Adult (1 of 2 - PCV13) 03/03/1991  . INFLUENZA VACCINE  05/21/2016   Fall Risk  12/04/2015 12/04/2015  Falls in the past year? Yes No  Number falls in past yr: 1 -  Injury with  Fall? No -   Functional Status Survey:    Filed Vitals:   01/29/16 1531  BP: 128/74  Pulse: 78  Temp: 97.9 F (36.6 C)  Resp: 18  Height: 5\' 6"  (1.676 m)  Weight: 152 lb (68.947 kg)  SpO2: 97%   Body mass index is 24.55 kg/(m^2). Physical Exam  Constitutional: She is oriented to person, place, and time. She appears well-developed and well-nourished. No distress.  Elderly and frail  HENT:  Right Ear: External ear normal.  Left Ear: External ear normal.  Nose: Nose normal.  Mouth/Throat: Oropharynx is clear and moist. No oropharyngeal exudate.  Bilateral hearing loss  Eyes: Conjunctivae and EOM are normal. Pupils are equal, round, and reactive to light. No scleral icterus.  Prescription lenses  Neck: No JVD present. No tracheal deviation present. No thyromegaly present.  Cardiovascular: Normal rate, regular rhythm, normal heart sounds and intact distal pulses.  Exam reveals no gallop and no friction rub.   No murmur heard. Pulmonary/Chest: Effort normal. No respiratory distress. She has no wheezes. She has no rales. She exhibits no tenderness.  Oxygen supplementation  Abdominal: She exhibits no distension and no mass. There is no tenderness.  Musculoskeletal: Normal range of motion. She exhibits no edema or tenderness.  Lymphadenopathy:    She has no cervical adenopathy.  Neurological: She is alert and oriented to person,  place, and time. No cranial nerve deficit. Coordination normal.  Skin: No rash noted. She is not diaphoretic. No erythema. No pallor.  Sacral decubitus and the inner portion of the left buttock. Measures [phone] cm. Mildly tender to pressure. Covered with foam dressing.  Psychiatric: She has a normal mood and affect. Her behavior is normal. Judgment and thought content normal.    Labs reviewed:  Recent Labs  01/15/16 0542 01/16/16 0527 01/17/16 0454  NA 146* 146* 144  K 4.0 4.4 4.2  CL 106 106 104  CO2 32 32 33*  GLUCOSE 149* 156* 111*  BUN 18 26* 28*  CREATININE 0.48 0.49 0.56  CALCIUM 8.8* 8.9 8.7*    Recent Labs  12/04/15 1237  AST 14  ALT 16  ALKPHOS 82  BILITOT 0.4  PROT 6.3  ALBUMIN 3.5*    Recent Labs  01/14/16 1315 01/15/16 0542 01/16/16 0527 01/17/16 0454  WBC 21.3* 19.3* 18.2* 15.7*  NEUTROABS 18.0*  --   --   --   HGB 11.2* 10.8* 10.4* 10.2*  HCT 37.9 36.3 35.2* 33.8*  MCV 90.2 91.2 91.0 88.3  PLT 416* 383 441* 451*   Lab Results  Component Value Date   TSH 0.246 *Test methodology is 3rd generation TSH* 02/21/2008   Lab Results  Component Value Date   HGBA1C  02/21/2008    5.3 (NOTE)   The ADA recommends the following therapeutic goals for glycemic   control related to Hgb A1C measurement:   Goal of Therapy:   < 7.0% Hgb A1C   Action Suggested:  > 8.0% Hgb A1C   Ref:  Diabetes Care, 22, Suppl. 1, 1999   No results found for: CHOL, HDL, LDLCALC, LDLDIRECT, TRIG, CHOLHDL  Significant Diagnostic Results in last 30 days:  Dg Chest 2 View  01/17/2016  CLINICAL DATA:  Shortness of breath.  Pneumonia. EXAM: CHEST  2 VIEW COMPARISON:  01/14/2016 FINDINGS: Chronic interstitial coarsening from bronchitic markings and emphysema based on 2006 chest CT. Interstitial coarsening above baseline with small but increased bilateral pleural effusion. There is asymmetric hazy density at the left base. Chronic  cardiomegaly with stable aortic and hilar contours.  Stable biapical pleural thickening. IMPRESSION: CHF pattern.History of pneumonia which could be superimposed at the left base. Electronically Signed   By: Monte Fantasia M.D.   On: 01/17/2016 08:49   Dg Chest 2 View  01/14/2016  CLINICAL DATA:  Cough, shortness of breath, nausea.  Flu. EXAM: CHEST  2 VIEW COMPARISON:  07/27/2015 FINDINGS: Increased interstitial markings, some of which may be chronic, although mild interstitial edema is possible. Patchy left lower lobe opacity, possibly atelectasis, pneumonia not excluded. Suspected small left pleural effusion. Cardiomegaly. IMPRESSION: Cardiomegaly with possible mild interstitial edema. Patchy left lower lobe opacity, atelectasis versus pneumonia. Suspected small left pleural effusion. Electronically Signed   By: Julian Hy M.D.   On: 01/14/2016 13:34    Assessment/Plan 1. Decubitus ulcer of sacral region, stage 2 Cleansed with wound cleanser and apply foam dressing every 3 days and as needed.  2. Severe single current episode of major depressive disorder, without psychotic features ([place]) Start sertraline 50 mg daily

## 2016-01-31 ENCOUNTER — Encounter: Payer: Self-pay | Admitting: Nurse Practitioner

## 2016-01-31 NOTE — Addendum Note (Signed)
Addended by: Estill Dooms on: 01/31/2016 05:13 PM   Modules accepted: Level of Service

## 2016-01-31 NOTE — Progress Notes (Signed)
This encounter was created in error - please disregard.

## 2016-02-07 ENCOUNTER — Non-Acute Institutional Stay (SKILLED_NURSING_FACILITY): Payer: Medicare Other | Admitting: Nurse Practitioner

## 2016-02-07 ENCOUNTER — Encounter: Payer: Self-pay | Admitting: Nurse Practitioner

## 2016-02-07 DIAGNOSIS — F321 Major depressive disorder, single episode, moderate: Secondary | ICD-10-CM | POA: Diagnosis not present

## 2016-02-07 DIAGNOSIS — K219 Gastro-esophageal reflux disease without esophagitis: Secondary | ICD-10-CM | POA: Diagnosis not present

## 2016-02-07 DIAGNOSIS — D649 Anemia, unspecified: Secondary | ICD-10-CM

## 2016-02-07 DIAGNOSIS — J410 Simple chronic bronchitis: Secondary | ICD-10-CM

## 2016-02-07 DIAGNOSIS — I10 Essential (primary) hypertension: Secondary | ICD-10-CM

## 2016-02-07 DIAGNOSIS — I480 Paroxysmal atrial fibrillation: Secondary | ICD-10-CM | POA: Diagnosis not present

## 2016-02-10 DIAGNOSIS — J441 Chronic obstructive pulmonary disease with (acute) exacerbation: Secondary | ICD-10-CM | POA: Diagnosis not present

## 2016-02-10 DIAGNOSIS — R413 Other amnesia: Secondary | ICD-10-CM | POA: Diagnosis not present

## 2016-02-10 DIAGNOSIS — Z9981 Dependence on supplemental oxygen: Secondary | ICD-10-CM | POA: Diagnosis not present

## 2016-02-10 DIAGNOSIS — E785 Hyperlipidemia, unspecified: Secondary | ICD-10-CM | POA: Diagnosis not present

## 2016-02-10 DIAGNOSIS — Z8701 Personal history of pneumonia (recurrent): Secondary | ICD-10-CM | POA: Diagnosis not present

## 2016-02-10 DIAGNOSIS — F329 Major depressive disorder, single episode, unspecified: Secondary | ICD-10-CM | POA: Diagnosis not present

## 2016-02-10 DIAGNOSIS — H3530 Unspecified macular degeneration: Secondary | ICD-10-CM | POA: Diagnosis not present

## 2016-02-10 DIAGNOSIS — I10 Essential (primary) hypertension: Secondary | ICD-10-CM | POA: Diagnosis not present

## 2016-02-10 DIAGNOSIS — I480 Paroxysmal atrial fibrillation: Secondary | ICD-10-CM | POA: Diagnosis not present

## 2016-02-10 DIAGNOSIS — Z7982 Long term (current) use of aspirin: Secondary | ICD-10-CM | POA: Diagnosis not present

## 2016-02-10 DIAGNOSIS — H9193 Unspecified hearing loss, bilateral: Secondary | ICD-10-CM | POA: Diagnosis not present

## 2016-02-12 ENCOUNTER — Telehealth: Payer: Self-pay

## 2016-02-12 DIAGNOSIS — K219 Gastro-esophageal reflux disease without esophagitis: Secondary | ICD-10-CM | POA: Insufficient documentation

## 2016-02-12 NOTE — Assessment & Plan Note (Signed)
Her mood is table, continue Sertraline 50mg , Diazepam 5mg  daily prn.

## 2016-02-12 NOTE — Telephone Encounter (Signed)
Per Dr. Nyoka Cowden, call [facility] for patient to get portable oxygen. Patient was discharged from [facility] 02/07/16 to get portable oxygen. Per [facility] they needed last note stating to add portable oxygen, Rx for portable oxygen, O2 State results without oxygen

## 2016-02-12 NOTE — Assessment & Plan Note (Signed)
Blood pressure is controlled, continue Lisinopril 20mg , Metoprolol 50mg  daily.

## 2016-02-12 NOTE — Assessment & Plan Note (Addendum)
Stable, completed Levaquin in SNF, continue with Atrovent Neb 4x/day, O2, Albuterol HFA 2 puffs q6h prn. She needs a portable oxygen tank for travel.

## 2016-02-12 NOTE — Assessment & Plan Note (Signed)
Stable, continue Omeprazole 20mg daily.  

## 2016-02-12 NOTE — Telephone Encounter (Signed)
Called patient to see if  Home Health is coming to her house, we need to get O2 without oxygen, no one has come out yet. I will follow-up on it. Just wanted her to know that we are working on it.  Fax note, Rx, O2 results to [facility].

## 2016-02-12 NOTE — Assessment & Plan Note (Addendum)
Last Hgb 10.2 01/17/16, continue Fe 325mg  daily

## 2016-02-12 NOTE — Assessment & Plan Note (Signed)
Heart rate is in control, continue Metoprolol 50mg  daily.

## 2016-02-12 NOTE — Progress Notes (Signed)
Patient ID: [name], female   DOB: 1923-12-01, 82 y.o.   MRN: [id number]  Location:  [facility] Room Number: [id number] A Place of Service:  SNF (31)  Provider: Marlana Latus NP  PCP: Estill Dooms, MD Patient Care Team: Estill Dooms, MD as PCP - General (Internal Medicine) Merrilee Seashore, MD as Consulting Physician (Internal Medicine)  Extended Emergency Contact Information Primary Emergency Contact: Hedrick,Gary Address: [address]          [place], [place] [address] [place] Phone: [phone] Mobile Phone: [phone] Relation: Son  Code Status: DNR Goals of care:  Advanced Directive information Advanced Directives 02/07/2016  Does patient have an advance directive? No  Copy of advanced directive(s) in chart? No - copy requested  Would patient like information on creating an advanced directive? -     Allergies  Allergen Reactions  . Cefuroxime Nausea And Vomiting    Chief Complaint  Patient presents with  . Discharge Note    Patient will be leaving [facility] today    HPI:  82 y.o. female  Hx of COPD, recent exacerbation, stabilized, completed Levaquin at SNF, currently taking O2, Atrovent Neb qid, prn Albuterol HFA, she was noted to have diffused mild expiratory wheezes upon my examination, otherwise stable. Blood pressure and heart rate are controlled on Lisinopril 20mg  and Metoprolol 50mg  daily. Taking ASA and Atorvastatin for risk reduction. Her mood is stable, taking Sertraline 50mg  daily, Diazepam 5mg  daily prn. Last Hgb 10.2 01/17/16 while on Fe 325mg  daily. GERD is controlled on Omeprazole 20mg  daily.     Past Medical History  Diagnosis Date  . Paroxysmal atrial fibrillation (HCC)   . Left bundle branch block   . Hypertension   . Hyperlipidemia   . Chest pain   . Tobacco abuse   . COPD (chronic obstructive pulmonary disease) ([place])   . Personal history of subdural hematoma   . Cataract   .  Subdural hematoma ([place]) 2009  . Hyperglycemia 01/21/2016  . Hearing loss 01/21/2016  . Macular degeneration 01/21/2016  . Memory loss 01/21/2016  . Debility 01/21/2016  . Major depression ([place]) 01/29/2016    Past Surgical History  Procedure Laterality Date  . Eye surgery    . Abdominal hysterectomy    . Appendectomy    . Brain surgery        reports that she has been smoking Cigarettes.  She started smoking about 70 years ago. She has a 73 pack-year smoking history. She has never used smokeless tobacco. She reports that she does not drink alcohol or use illicit drugs. Social History   Social History  . Marital Status: Divorced    Spouse Name: N/A  . Number of Children: N/A  . Years of Education: N/A   Occupational History  . Not on file.   Social History Main Topics  . Smoking status: Current Every Day Smoker -- 1.00 packs/day for 73 years    Types: Cigarettes    Start date: 07/30/1945  . Smokeless tobacco: Never Used  . Alcohol Use: No  . Drug Use: No  . Sexual Activity: Not on file   Other Topics Concern  . Not on file   Social History Narrative   Functional Status Survey:    Allergies  Allergen Reactions  . Cefuroxime Nausea And Vomiting    Pertinent  Health Maintenance Due  Topic Date Due  . DEXA SCAN  03/03/1991  . PNA vac  Low Risk Adult (1 of 2 - PCV13) 03/03/1991  . INFLUENZA VACCINE  05/21/2016    Medications:   Medication List       This list is accurate as of: 02/07/16 11:59 PM.  Always use your most recent med list.               acetaminophen 500 MG tablet  Commonly known as:  TYLENOL  Take 1,000 mg by mouth daily as needed for mild pain.     albuterol 108 (90 Base) MCG/ACT inhaler  Commonly known as:  PROVENTIL HFA;VENTOLIN HFA  Inhale 2 puffs into the lungs every 6 (six) hours as needed.     aspirin 325 MG tablet  Take 325 mg by mouth daily.     atorvastatin 40 MG tablet  Commonly known as:  LIPITOR  Take 40 mg by mouth every other  day. Every other day     diazepam 5 MG tablet  Commonly known as:  VALIUM  Take 1 tablet (5 mg total) by mouth daily as needed for anxiety.     ferrous sulfate 325 (65 FE) MG tablet  Take 325 mg by mouth daily with breakfast.     fish oil-omega-3 fatty acids 1000 MG capsule  Take 1 g by mouth 2 (two) times daily.     ipratropium 0.02 % nebulizer solution  Commonly known as:  ATROVENT  Take 0.5 mg by nebulization 4 (four) times daily.     ipratropium 0.02 % nebulizer solution  Commonly known as:  ATROVENT  Take 1.25 mLs (250 mcg total) by nebulization 4 (four) times daily.     levofloxacin 750 MG tablet  Commonly known as:  LEVAQUIN  Take 1 tablet (750 mg total) by mouth every other day.     lisinopril 20 MG tablet  Commonly known as:  PRINIVIL,ZESTRIL  Take 20 mg by mouth daily.     meclizine 12.5 MG tablet  Commonly known as:  ANTIVERT  Take 1 tablet (12.5 mg total) by mouth 3 (three) times daily.     metoprolol succinate 50 MG 24 hr tablet  Commonly known as:  TOPROL-XL  Take 1 tablet (50 mg total) by mouth daily. Take with or immediately following a meal.     omeprazole 20 MG capsule  Commonly known as:  PRILOSEC  Take 20 mg by mouth daily.     OXYGEN  Inhale 2 L into the lungs at bedtime. Reported on 12/04/2015     sertraline 50 MG tablet  Commonly known as:  ZOLOFT  One daily to help depression     verapamil 240 MG (CO) 24 hr tablet  Commonly known as:  COVERA HS  Take 240 mg by mouth daily.        Review of Systems  Constitutional: Positive for activity change and fatigue. Negative for fever, chills, diaphoresis, appetite change and unexpected weight change.  HENT: Positive for hearing loss. Negative for congestion, ear discharge, ear pain, postnasal drip, rhinorrhea, sore throat, tinnitus, trouble swallowing and voice change.   Eyes: Positive for visual disturbance (Macular degeneration with visual loss). Negative for pain, redness and itching.    Respiratory: Positive for shortness of breath. Negative for cough, choking and wheezing.   Cardiovascular: Negative for chest pain, palpitations and leg swelling.  Gastrointestinal: Negative for nausea, abdominal pain, diarrhea, constipation and abdominal distention.  Endocrine: Negative for cold intolerance, heat intolerance, polydipsia, polyphagia and polyuria.       Hyperglycemia while hospitalized and on  steroids  Genitourinary: Negative for dysuria, urgency, frequency, hematuria, flank pain, vaginal discharge, difficulty urinating and pelvic pain.  Musculoskeletal: Negative for myalgias, back pain, arthralgias, gait problem, neck pain and neck stiffness.       Generalized weakness  Skin: Negative for color change, pallor and rash.       Sacral decubitus and the inner portion of the left buttock [phone] cm. Covered with foam dressing.  Allergic/Immunologic: Negative.   Neurological: Negative for dizziness, tremors, seizures, syncope, weakness, numbness and headaches.  Hematological: Negative for adenopathy. Does not bruise/bleed easily.       Anemia  Psychiatric/Behavioral: Positive for dysphoric mood (Verbalizes hopelessness feelings). Negative for suicidal ideas, hallucinations, behavioral problems, confusion, sleep disturbance and agitation. The patient is not nervous/anxious and is not hyperactive.     Filed Vitals:   02/07/16 1454  BP: 124/70  Pulse: 72  Temp: 97.9 F (36.6 C)  TempSrc: Oral  Resp: 18  Height: 5\' 6"  (1.676 m)  Weight: 154 lb (69.854 kg)  SpO2: 97%   Body mass index is 24.87 kg/(m^2). Physical Exam  Constitutional: She is oriented to person, place, and time. She appears well-developed and well-nourished. No distress.  Elderly and frail  HENT:  Right Ear: External ear normal.  Left Ear: External ear normal.  Nose: Nose normal.  Mouth/Throat: Oropharynx is clear and moist. No oropharyngeal exudate.  Bilateral hearing loss  Eyes: Conjunctivae and EOM  are normal. Pupils are equal, round, and reactive to light. No scleral icterus.  Prescription lenses  Neck: No JVD present. No tracheal deviation present. No thyromegaly present.  Cardiovascular: Normal rate, regular rhythm, normal heart sounds and intact distal pulses.  Exam reveals no gallop and no friction rub.   No murmur heard. Pulmonary/Chest: Effort normal. No respiratory distress. She has wheezes. She has no rales. She exhibits no tenderness.  Oxygen supplementation. Diffused mild expiratory wheezes posterior lungs.   Abdominal: She exhibits no distension and no mass. There is no tenderness.  Musculoskeletal: Normal range of motion. She exhibits no edema or tenderness.  Lymphadenopathy:    She has no cervical adenopathy.  Neurological: She is alert and oriented to person, place, and time. No cranial nerve deficit. Coordination normal.  Skin: No rash noted. She is not diaphoretic. No erythema. No pallor.  Sacral decubitus and the inner portion of the left buttock. Measures [phone] cm. Mildly tender to pressure. Covered with foam dressing.  Psychiatric: She has a normal mood and affect. Her behavior is normal. Judgment and thought content normal.    Labs reviewed: Basic Metabolic Panel:  Recent Labs  01/15/16 0542 01/16/16 0527 01/17/16 0454  NA 146* 146* 144  K 4.0 4.4 4.2  CL 106 106 104  CO2 32 32 33*  GLUCOSE 149* 156* 111*  BUN 18 26* 28*  CREATININE 0.48 0.49 0.56  CALCIUM 8.8* 8.9 8.7*   Liver Function Tests:  Recent Labs  12/04/15 1237  AST 14  ALT 16  ALKPHOS 82  BILITOT 0.4  PROT 6.3  ALBUMIN 3.5*   No results for input(s): LIPASE, AMYLASE in the last 8760 hours. No results for input(s): AMMONIA in the last 8760 hours. CBC:  Recent Labs  01/14/16 1315 01/15/16 0542 01/16/16 0527 01/17/16 0454  WBC 21.3* 19.3* 18.2* 15.7*  NEUTROABS 18.0*  --   --   --   HGB 11.2* 10.8* 10.4* 10.2*  HCT 37.9 36.3 35.2* 33.8*  MCV 90.2 91.2 91.0 88.3  PLT  416* 383 441* 451*  Cardiac Enzymes: No results for input(s): CKTOTAL, CKMB, CKMBINDEX, TROPONINI in the last 8760 hours. BNP: Invalid input(s): POCBNP CBG: No results for input(s): GLUCAP in the last 8760 hours.  Procedures and Imaging Studies During Stay: Dg Chest 2 View  01/17/2016  CLINICAL DATA:  Shortness of breath.  Pneumonia. EXAM: CHEST  2 VIEW COMPARISON:  01/14/2016 FINDINGS: Chronic interstitial coarsening from bronchitic markings and emphysema based on 2006 chest CT. Interstitial coarsening above baseline with small but increased bilateral pleural effusion. There is asymmetric hazy density at the left base. Chronic cardiomegaly with stable aortic and hilar contours. Stable biapical pleural thickening. IMPRESSION: CHF pattern.History of pneumonia which could be superimposed at the left base. Electronically Signed   By: Monte Fantasia M.D.   On: 01/17/2016 08:49   Dg Chest 2 View  01/14/2016  CLINICAL DATA:  Cough, shortness of breath, nausea.  Flu. EXAM: CHEST  2 VIEW COMPARISON:  07/27/2015 FINDINGS: Increased interstitial markings, some of which may be chronic, although mild interstitial edema is possible. Patchy left lower lobe opacity, possibly atelectasis, pneumonia not excluded. Suspected small left pleural effusion. Cardiomegaly. IMPRESSION: Cardiomegaly with possible mild interstitial edema. Patchy left lower lobe opacity, atelectasis versus pneumonia. Suspected small left pleural effusion. Electronically Signed   By: Julian Hy M.D.   On: 01/14/2016 13:34    Assessment/Plan:   COPD (chronic obstructive pulmonary disease) Stable, completed Levaquin in SNF, continue with Atrovent Neb 4x/day, O2, Albuterol HFA 2 puffs q6h prn.   Essential hypertension Blood pressure is controlled, continue Lisinopril 20mg , Metoprolol 50mg  daily.   Major depression ([place]) Her mood is table, continue Sertraline 50mg , Diazepam 5mg  daily prn.   Paroxysmal atrial fibrillation  (HCC) Heart rate is in control, continue Metoprolol 50mg  daily.   GERD (gastroesophageal reflux disease) Stable, continue Omeprazole 20mg  daily.   Anemia Last Hgb 10.2 01/17/16, continue Fe 325mg  daily    Patient is being discharged with the following home health services:    Patient is being discharged with the following durable medical equipment:    Patient has been advised to f/u with their PCP in 1-2 weeks to bring them up to date on their rehab stay.  Social services at facility was responsible for arranging this appointment.  Pt was provided with a 30 day supply of prescriptions for medications and refills must be obtained from their PCP.  For controlled substances, a more limited supply may be provided adequate until PCP appointment only.  Future labs/tests needed:  none

## 2016-02-14 DIAGNOSIS — I10 Essential (primary) hypertension: Secondary | ICD-10-CM | POA: Diagnosis not present

## 2016-02-14 DIAGNOSIS — R413 Other amnesia: Secondary | ICD-10-CM | POA: Diagnosis not present

## 2016-02-14 DIAGNOSIS — Z9981 Dependence on supplemental oxygen: Secondary | ICD-10-CM | POA: Diagnosis not present

## 2016-02-14 DIAGNOSIS — Z7982 Long term (current) use of aspirin: Secondary | ICD-10-CM | POA: Diagnosis not present

## 2016-02-14 DIAGNOSIS — J441 Chronic obstructive pulmonary disease with (acute) exacerbation: Secondary | ICD-10-CM | POA: Diagnosis not present

## 2016-02-14 DIAGNOSIS — H9193 Unspecified hearing loss, bilateral: Secondary | ICD-10-CM | POA: Diagnosis not present

## 2016-02-14 DIAGNOSIS — I480 Paroxysmal atrial fibrillation: Secondary | ICD-10-CM | POA: Diagnosis not present

## 2016-02-14 DIAGNOSIS — E785 Hyperlipidemia, unspecified: Secondary | ICD-10-CM | POA: Diagnosis not present

## 2016-02-14 DIAGNOSIS — H3530 Unspecified macular degeneration: Secondary | ICD-10-CM | POA: Diagnosis not present

## 2016-02-14 NOTE — Progress Notes (Signed)
Patient ID: [name], female   DOB: 1924-06-29, 82 y.o.   MRN: [id number] The patient has diagnosis of COPD, she needs a portable oxygen tank for travel.

## 2016-02-14 NOTE — Telephone Encounter (Signed)
Called [facility] [phone], spoke with Zigmund Daniel, they will be able to get someone to check patient's oxygen with 02 and fax it to our office. They need order for testing to qualify for portable oxygen fax to [phone]. They will do the 3 step test.

## 2016-02-15 DIAGNOSIS — I480 Paroxysmal atrial fibrillation: Secondary | ICD-10-CM | POA: Diagnosis not present

## 2016-02-15 DIAGNOSIS — R413 Other amnesia: Secondary | ICD-10-CM | POA: Diagnosis not present

## 2016-02-15 DIAGNOSIS — J441 Chronic obstructive pulmonary disease with (acute) exacerbation: Secondary | ICD-10-CM | POA: Diagnosis not present

## 2016-02-15 DIAGNOSIS — H9193 Unspecified hearing loss, bilateral: Secondary | ICD-10-CM | POA: Diagnosis not present

## 2016-02-15 DIAGNOSIS — E785 Hyperlipidemia, unspecified: Secondary | ICD-10-CM | POA: Diagnosis not present

## 2016-02-15 DIAGNOSIS — I10 Essential (primary) hypertension: Secondary | ICD-10-CM | POA: Diagnosis not present

## 2016-02-15 DIAGNOSIS — Z9981 Dependence on supplemental oxygen: Secondary | ICD-10-CM | POA: Diagnosis not present

## 2016-02-15 DIAGNOSIS — Z7982 Long term (current) use of aspirin: Secondary | ICD-10-CM | POA: Diagnosis not present

## 2016-02-15 DIAGNOSIS — H3530 Unspecified macular degeneration: Secondary | ICD-10-CM | POA: Diagnosis not present

## 2016-02-15 NOTE — Telephone Encounter (Signed)
Received oxygen results from [facility] 02/14/16 89-90 without 02 at rest; 93 with 02 at rest Upon exertion on 2 minute walk test completing 64 ft with 02 slightly SOB SPO2 dropped to 91 and recovery to 92 in 1 minute HR 66 2 minute walk 78 ft with 02 slight SOB, HR 68, SP02 down to 86, recovery to 89 and 90 after 52 secs.  BORG 13/20; dyspnea 3/10 4 mins 16 secs being up at slow paced activity standing; roughly 100 fl slow walk to bathroom, sit and stand on commode, walk to kitchen SPO2 83 up to 87 in 1 minute. Then used 02 and was up to 93 in < 2 minutes.   Fax oxygen report, Order for portable oxygen and 02/07/16 nursing home note from [facility] with Dr. Rolly Salter signature to Ace Gins [phone].

## 2016-02-16 DIAGNOSIS — Z7982 Long term (current) use of aspirin: Secondary | ICD-10-CM | POA: Diagnosis not present

## 2016-02-16 DIAGNOSIS — R413 Other amnesia: Secondary | ICD-10-CM | POA: Diagnosis not present

## 2016-02-16 DIAGNOSIS — H3530 Unspecified macular degeneration: Secondary | ICD-10-CM | POA: Diagnosis not present

## 2016-02-16 DIAGNOSIS — I10 Essential (primary) hypertension: Secondary | ICD-10-CM | POA: Diagnosis not present

## 2016-02-16 DIAGNOSIS — Z9981 Dependence on supplemental oxygen: Secondary | ICD-10-CM | POA: Diagnosis not present

## 2016-02-16 DIAGNOSIS — I480 Paroxysmal atrial fibrillation: Secondary | ICD-10-CM | POA: Diagnosis not present

## 2016-02-16 DIAGNOSIS — J441 Chronic obstructive pulmonary disease with (acute) exacerbation: Secondary | ICD-10-CM | POA: Diagnosis not present

## 2016-02-16 DIAGNOSIS — E785 Hyperlipidemia, unspecified: Secondary | ICD-10-CM | POA: Diagnosis not present

## 2016-02-16 DIAGNOSIS — H9193 Unspecified hearing loss, bilateral: Secondary | ICD-10-CM | POA: Diagnosis not present

## 2016-02-18 ENCOUNTER — Other Ambulatory Visit: Payer: Self-pay | Admitting: Nurse Practitioner

## 2016-02-20 DIAGNOSIS — Z7982 Long term (current) use of aspirin: Secondary | ICD-10-CM | POA: Diagnosis not present

## 2016-02-20 DIAGNOSIS — I10 Essential (primary) hypertension: Secondary | ICD-10-CM | POA: Diagnosis not present

## 2016-02-20 DIAGNOSIS — Z9981 Dependence on supplemental oxygen: Secondary | ICD-10-CM | POA: Diagnosis not present

## 2016-02-20 DIAGNOSIS — E785 Hyperlipidemia, unspecified: Secondary | ICD-10-CM | POA: Diagnosis not present

## 2016-02-20 DIAGNOSIS — H9193 Unspecified hearing loss, bilateral: Secondary | ICD-10-CM | POA: Diagnosis not present

## 2016-02-20 DIAGNOSIS — R413 Other amnesia: Secondary | ICD-10-CM | POA: Diagnosis not present

## 2016-02-20 DIAGNOSIS — I480 Paroxysmal atrial fibrillation: Secondary | ICD-10-CM | POA: Diagnosis not present

## 2016-02-20 DIAGNOSIS — H3530 Unspecified macular degeneration: Secondary | ICD-10-CM | POA: Diagnosis not present

## 2016-02-20 DIAGNOSIS — J441 Chronic obstructive pulmonary disease with (acute) exacerbation: Secondary | ICD-10-CM | POA: Diagnosis not present

## 2016-02-21 DIAGNOSIS — Z7982 Long term (current) use of aspirin: Secondary | ICD-10-CM | POA: Diagnosis not present

## 2016-02-21 DIAGNOSIS — I10 Essential (primary) hypertension: Secondary | ICD-10-CM | POA: Diagnosis not present

## 2016-02-21 DIAGNOSIS — Z9981 Dependence on supplemental oxygen: Secondary | ICD-10-CM | POA: Diagnosis not present

## 2016-02-21 DIAGNOSIS — I480 Paroxysmal atrial fibrillation: Secondary | ICD-10-CM | POA: Diagnosis not present

## 2016-02-21 DIAGNOSIS — H3530 Unspecified macular degeneration: Secondary | ICD-10-CM | POA: Diagnosis not present

## 2016-02-21 DIAGNOSIS — H9193 Unspecified hearing loss, bilateral: Secondary | ICD-10-CM | POA: Diagnosis not present

## 2016-02-21 DIAGNOSIS — E785 Hyperlipidemia, unspecified: Secondary | ICD-10-CM | POA: Diagnosis not present

## 2016-02-21 DIAGNOSIS — R413 Other amnesia: Secondary | ICD-10-CM | POA: Diagnosis not present

## 2016-02-21 DIAGNOSIS — J441 Chronic obstructive pulmonary disease with (acute) exacerbation: Secondary | ICD-10-CM | POA: Diagnosis not present

## 2016-02-22 DIAGNOSIS — R413 Other amnesia: Secondary | ICD-10-CM | POA: Diagnosis not present

## 2016-02-22 DIAGNOSIS — I480 Paroxysmal atrial fibrillation: Secondary | ICD-10-CM | POA: Diagnosis not present

## 2016-02-22 DIAGNOSIS — Z7982 Long term (current) use of aspirin: Secondary | ICD-10-CM | POA: Diagnosis not present

## 2016-02-22 DIAGNOSIS — Z9981 Dependence on supplemental oxygen: Secondary | ICD-10-CM | POA: Diagnosis not present

## 2016-02-22 DIAGNOSIS — E785 Hyperlipidemia, unspecified: Secondary | ICD-10-CM | POA: Diagnosis not present

## 2016-02-22 DIAGNOSIS — H3530 Unspecified macular degeneration: Secondary | ICD-10-CM | POA: Diagnosis not present

## 2016-02-22 DIAGNOSIS — I10 Essential (primary) hypertension: Secondary | ICD-10-CM | POA: Diagnosis not present

## 2016-02-22 DIAGNOSIS — J441 Chronic obstructive pulmonary disease with (acute) exacerbation: Secondary | ICD-10-CM | POA: Diagnosis not present

## 2016-02-22 DIAGNOSIS — H9193 Unspecified hearing loss, bilateral: Secondary | ICD-10-CM | POA: Diagnosis not present

## 2016-02-23 DIAGNOSIS — H3530 Unspecified macular degeneration: Secondary | ICD-10-CM | POA: Diagnosis not present

## 2016-02-23 DIAGNOSIS — H9193 Unspecified hearing loss, bilateral: Secondary | ICD-10-CM | POA: Diagnosis not present

## 2016-02-23 DIAGNOSIS — I480 Paroxysmal atrial fibrillation: Secondary | ICD-10-CM | POA: Diagnosis not present

## 2016-02-23 DIAGNOSIS — E785 Hyperlipidemia, unspecified: Secondary | ICD-10-CM | POA: Diagnosis not present

## 2016-02-23 DIAGNOSIS — Z7982 Long term (current) use of aspirin: Secondary | ICD-10-CM | POA: Diagnosis not present

## 2016-02-23 DIAGNOSIS — J441 Chronic obstructive pulmonary disease with (acute) exacerbation: Secondary | ICD-10-CM | POA: Diagnosis not present

## 2016-02-23 DIAGNOSIS — R413 Other amnesia: Secondary | ICD-10-CM | POA: Diagnosis not present

## 2016-02-23 DIAGNOSIS — Z9981 Dependence on supplemental oxygen: Secondary | ICD-10-CM | POA: Diagnosis not present

## 2016-02-23 DIAGNOSIS — I10 Essential (primary) hypertension: Secondary | ICD-10-CM | POA: Diagnosis not present

## 2016-02-25 ENCOUNTER — Other Ambulatory Visit: Payer: Self-pay | Admitting: Nurse Practitioner

## 2016-02-26 DIAGNOSIS — I480 Paroxysmal atrial fibrillation: Secondary | ICD-10-CM | POA: Diagnosis not present

## 2016-02-26 DIAGNOSIS — G4733 Obstructive sleep apnea (adult) (pediatric): Secondary | ICD-10-CM | POA: Diagnosis not present

## 2016-02-26 DIAGNOSIS — R413 Other amnesia: Secondary | ICD-10-CM | POA: Diagnosis not present

## 2016-02-26 DIAGNOSIS — Z7982 Long term (current) use of aspirin: Secondary | ICD-10-CM | POA: Diagnosis not present

## 2016-02-26 DIAGNOSIS — H3530 Unspecified macular degeneration: Secondary | ICD-10-CM | POA: Diagnosis not present

## 2016-02-26 DIAGNOSIS — J432 Centrilobular emphysema: Secondary | ICD-10-CM | POA: Diagnosis not present

## 2016-02-26 DIAGNOSIS — H9193 Unspecified hearing loss, bilateral: Secondary | ICD-10-CM | POA: Diagnosis not present

## 2016-02-26 DIAGNOSIS — I10 Essential (primary) hypertension: Secondary | ICD-10-CM | POA: Diagnosis not present

## 2016-02-26 DIAGNOSIS — E785 Hyperlipidemia, unspecified: Secondary | ICD-10-CM | POA: Diagnosis not present

## 2016-02-26 DIAGNOSIS — I482 Chronic atrial fibrillation: Secondary | ICD-10-CM | POA: Diagnosis not present

## 2016-02-26 DIAGNOSIS — J441 Chronic obstructive pulmonary disease with (acute) exacerbation: Secondary | ICD-10-CM | POA: Diagnosis not present

## 2016-02-26 DIAGNOSIS — E782 Mixed hyperlipidemia: Secondary | ICD-10-CM | POA: Diagnosis not present

## 2016-02-26 DIAGNOSIS — D509 Iron deficiency anemia, unspecified: Secondary | ICD-10-CM | POA: Diagnosis not present

## 2016-02-26 DIAGNOSIS — Z9981 Dependence on supplemental oxygen: Secondary | ICD-10-CM | POA: Diagnosis not present

## 2016-02-27 DIAGNOSIS — H9193 Unspecified hearing loss, bilateral: Secondary | ICD-10-CM | POA: Diagnosis not present

## 2016-02-27 DIAGNOSIS — I480 Paroxysmal atrial fibrillation: Secondary | ICD-10-CM | POA: Diagnosis not present

## 2016-02-27 DIAGNOSIS — J441 Chronic obstructive pulmonary disease with (acute) exacerbation: Secondary | ICD-10-CM | POA: Diagnosis not present

## 2016-02-27 DIAGNOSIS — H3530 Unspecified macular degeneration: Secondary | ICD-10-CM | POA: Diagnosis not present

## 2016-02-27 DIAGNOSIS — I10 Essential (primary) hypertension: Secondary | ICD-10-CM | POA: Diagnosis not present

## 2016-02-27 DIAGNOSIS — Z7982 Long term (current) use of aspirin: Secondary | ICD-10-CM | POA: Diagnosis not present

## 2016-02-27 DIAGNOSIS — R413 Other amnesia: Secondary | ICD-10-CM | POA: Diagnosis not present

## 2016-02-27 DIAGNOSIS — E785 Hyperlipidemia, unspecified: Secondary | ICD-10-CM | POA: Diagnosis not present

## 2016-02-27 DIAGNOSIS — Z9981 Dependence on supplemental oxygen: Secondary | ICD-10-CM | POA: Diagnosis not present

## 2016-02-28 DIAGNOSIS — J441 Chronic obstructive pulmonary disease with (acute) exacerbation: Secondary | ICD-10-CM | POA: Diagnosis not present

## 2016-02-28 DIAGNOSIS — Z7982 Long term (current) use of aspirin: Secondary | ICD-10-CM | POA: Diagnosis not present

## 2016-02-28 DIAGNOSIS — I10 Essential (primary) hypertension: Secondary | ICD-10-CM | POA: Diagnosis not present

## 2016-02-28 DIAGNOSIS — R413 Other amnesia: Secondary | ICD-10-CM | POA: Diagnosis not present

## 2016-02-28 DIAGNOSIS — H9193 Unspecified hearing loss, bilateral: Secondary | ICD-10-CM | POA: Diagnosis not present

## 2016-02-28 DIAGNOSIS — E785 Hyperlipidemia, unspecified: Secondary | ICD-10-CM | POA: Diagnosis not present

## 2016-02-28 DIAGNOSIS — Z9981 Dependence on supplemental oxygen: Secondary | ICD-10-CM | POA: Diagnosis not present

## 2016-02-28 DIAGNOSIS — H3530 Unspecified macular degeneration: Secondary | ICD-10-CM | POA: Diagnosis not present

## 2016-02-28 DIAGNOSIS — I480 Paroxysmal atrial fibrillation: Secondary | ICD-10-CM | POA: Diagnosis not present

## 2016-02-29 DIAGNOSIS — J441 Chronic obstructive pulmonary disease with (acute) exacerbation: Secondary | ICD-10-CM | POA: Diagnosis not present

## 2016-02-29 DIAGNOSIS — H9193 Unspecified hearing loss, bilateral: Secondary | ICD-10-CM | POA: Diagnosis not present

## 2016-02-29 DIAGNOSIS — H3530 Unspecified macular degeneration: Secondary | ICD-10-CM | POA: Diagnosis not present

## 2016-02-29 DIAGNOSIS — Z7982 Long term (current) use of aspirin: Secondary | ICD-10-CM | POA: Diagnosis not present

## 2016-02-29 DIAGNOSIS — Z9981 Dependence on supplemental oxygen: Secondary | ICD-10-CM | POA: Diagnosis not present

## 2016-02-29 DIAGNOSIS — E785 Hyperlipidemia, unspecified: Secondary | ICD-10-CM | POA: Diagnosis not present

## 2016-02-29 DIAGNOSIS — I10 Essential (primary) hypertension: Secondary | ICD-10-CM | POA: Diagnosis not present

## 2016-02-29 DIAGNOSIS — J449 Chronic obstructive pulmonary disease, unspecified: Secondary | ICD-10-CM | POA: Diagnosis not present

## 2016-02-29 DIAGNOSIS — I480 Paroxysmal atrial fibrillation: Secondary | ICD-10-CM | POA: Diagnosis not present

## 2016-02-29 DIAGNOSIS — R413 Other amnesia: Secondary | ICD-10-CM | POA: Diagnosis not present

## 2016-03-01 DIAGNOSIS — H9193 Unspecified hearing loss, bilateral: Secondary | ICD-10-CM | POA: Diagnosis not present

## 2016-03-01 DIAGNOSIS — Z9981 Dependence on supplemental oxygen: Secondary | ICD-10-CM | POA: Diagnosis not present

## 2016-03-01 DIAGNOSIS — I480 Paroxysmal atrial fibrillation: Secondary | ICD-10-CM | POA: Diagnosis not present

## 2016-03-01 DIAGNOSIS — E785 Hyperlipidemia, unspecified: Secondary | ICD-10-CM | POA: Diagnosis not present

## 2016-03-01 DIAGNOSIS — Z7982 Long term (current) use of aspirin: Secondary | ICD-10-CM | POA: Diagnosis not present

## 2016-03-01 DIAGNOSIS — H3530 Unspecified macular degeneration: Secondary | ICD-10-CM | POA: Diagnosis not present

## 2016-03-01 DIAGNOSIS — R413 Other amnesia: Secondary | ICD-10-CM | POA: Diagnosis not present

## 2016-03-01 DIAGNOSIS — J441 Chronic obstructive pulmonary disease with (acute) exacerbation: Secondary | ICD-10-CM | POA: Diagnosis not present

## 2016-03-01 DIAGNOSIS — I10 Essential (primary) hypertension: Secondary | ICD-10-CM | POA: Diagnosis not present

## 2016-03-04 DIAGNOSIS — I482 Chronic atrial fibrillation: Secondary | ICD-10-CM | POA: Diagnosis not present

## 2016-03-04 DIAGNOSIS — Z7982 Long term (current) use of aspirin: Secondary | ICD-10-CM | POA: Diagnosis not present

## 2016-03-04 DIAGNOSIS — Z9981 Dependence on supplemental oxygen: Secondary | ICD-10-CM | POA: Diagnosis not present

## 2016-03-04 DIAGNOSIS — J441 Chronic obstructive pulmonary disease with (acute) exacerbation: Secondary | ICD-10-CM | POA: Diagnosis not present

## 2016-03-04 DIAGNOSIS — I480 Paroxysmal atrial fibrillation: Secondary | ICD-10-CM | POA: Diagnosis not present

## 2016-03-04 DIAGNOSIS — E785 Hyperlipidemia, unspecified: Secondary | ICD-10-CM | POA: Diagnosis not present

## 2016-03-04 DIAGNOSIS — E782 Mixed hyperlipidemia: Secondary | ICD-10-CM | POA: Diagnosis not present

## 2016-03-04 DIAGNOSIS — J449 Chronic obstructive pulmonary disease, unspecified: Secondary | ICD-10-CM | POA: Diagnosis not present

## 2016-03-04 DIAGNOSIS — R413 Other amnesia: Secondary | ICD-10-CM | POA: Diagnosis not present

## 2016-03-04 DIAGNOSIS — H3530 Unspecified macular degeneration: Secondary | ICD-10-CM | POA: Diagnosis not present

## 2016-03-04 DIAGNOSIS — H9193 Unspecified hearing loss, bilateral: Secondary | ICD-10-CM | POA: Diagnosis not present

## 2016-03-04 DIAGNOSIS — I10 Essential (primary) hypertension: Secondary | ICD-10-CM | POA: Diagnosis not present

## 2016-03-05 DIAGNOSIS — J441 Chronic obstructive pulmonary disease with (acute) exacerbation: Secondary | ICD-10-CM | POA: Diagnosis not present

## 2016-03-05 DIAGNOSIS — E785 Hyperlipidemia, unspecified: Secondary | ICD-10-CM | POA: Diagnosis not present

## 2016-03-05 DIAGNOSIS — H9193 Unspecified hearing loss, bilateral: Secondary | ICD-10-CM | POA: Diagnosis not present

## 2016-03-05 DIAGNOSIS — R413 Other amnesia: Secondary | ICD-10-CM | POA: Diagnosis not present

## 2016-03-05 DIAGNOSIS — I480 Paroxysmal atrial fibrillation: Secondary | ICD-10-CM | POA: Diagnosis not present

## 2016-03-05 DIAGNOSIS — Z7982 Long term (current) use of aspirin: Secondary | ICD-10-CM | POA: Diagnosis not present

## 2016-03-05 DIAGNOSIS — Z9981 Dependence on supplemental oxygen: Secondary | ICD-10-CM | POA: Diagnosis not present

## 2016-03-05 DIAGNOSIS — H3530 Unspecified macular degeneration: Secondary | ICD-10-CM | POA: Diagnosis not present

## 2016-03-05 DIAGNOSIS — I10 Essential (primary) hypertension: Secondary | ICD-10-CM | POA: Diagnosis not present

## 2016-03-06 DIAGNOSIS — H9193 Unspecified hearing loss, bilateral: Secondary | ICD-10-CM | POA: Diagnosis not present

## 2016-03-06 DIAGNOSIS — I480 Paroxysmal atrial fibrillation: Secondary | ICD-10-CM | POA: Diagnosis not present

## 2016-03-06 DIAGNOSIS — J441 Chronic obstructive pulmonary disease with (acute) exacerbation: Secondary | ICD-10-CM | POA: Diagnosis not present

## 2016-03-06 DIAGNOSIS — H3530 Unspecified macular degeneration: Secondary | ICD-10-CM | POA: Diagnosis not present

## 2016-03-06 DIAGNOSIS — R413 Other amnesia: Secondary | ICD-10-CM | POA: Diagnosis not present

## 2016-03-06 DIAGNOSIS — Z7982 Long term (current) use of aspirin: Secondary | ICD-10-CM | POA: Diagnosis not present

## 2016-03-06 DIAGNOSIS — Z9981 Dependence on supplemental oxygen: Secondary | ICD-10-CM | POA: Diagnosis not present

## 2016-03-06 DIAGNOSIS — E785 Hyperlipidemia, unspecified: Secondary | ICD-10-CM | POA: Diagnosis not present

## 2016-03-06 DIAGNOSIS — I10 Essential (primary) hypertension: Secondary | ICD-10-CM | POA: Diagnosis not present

## 2016-03-08 DIAGNOSIS — R413 Other amnesia: Secondary | ICD-10-CM | POA: Diagnosis not present

## 2016-03-08 DIAGNOSIS — Z9981 Dependence on supplemental oxygen: Secondary | ICD-10-CM | POA: Diagnosis not present

## 2016-03-08 DIAGNOSIS — H9193 Unspecified hearing loss, bilateral: Secondary | ICD-10-CM | POA: Diagnosis not present

## 2016-03-08 DIAGNOSIS — J441 Chronic obstructive pulmonary disease with (acute) exacerbation: Secondary | ICD-10-CM | POA: Diagnosis not present

## 2016-03-08 DIAGNOSIS — I480 Paroxysmal atrial fibrillation: Secondary | ICD-10-CM | POA: Diagnosis not present

## 2016-03-08 DIAGNOSIS — I10 Essential (primary) hypertension: Secondary | ICD-10-CM | POA: Diagnosis not present

## 2016-03-08 DIAGNOSIS — E785 Hyperlipidemia, unspecified: Secondary | ICD-10-CM | POA: Diagnosis not present

## 2016-03-08 DIAGNOSIS — Z7982 Long term (current) use of aspirin: Secondary | ICD-10-CM | POA: Diagnosis not present

## 2016-03-08 DIAGNOSIS — H3530 Unspecified macular degeneration: Secondary | ICD-10-CM | POA: Diagnosis not present

## 2016-03-11 DIAGNOSIS — E785 Hyperlipidemia, unspecified: Secondary | ICD-10-CM | POA: Diagnosis not present

## 2016-03-11 DIAGNOSIS — Z9981 Dependence on supplemental oxygen: Secondary | ICD-10-CM | POA: Diagnosis not present

## 2016-03-11 DIAGNOSIS — I480 Paroxysmal atrial fibrillation: Secondary | ICD-10-CM | POA: Diagnosis not present

## 2016-03-11 DIAGNOSIS — H3530 Unspecified macular degeneration: Secondary | ICD-10-CM | POA: Diagnosis not present

## 2016-03-11 DIAGNOSIS — J441 Chronic obstructive pulmonary disease with (acute) exacerbation: Secondary | ICD-10-CM | POA: Diagnosis not present

## 2016-03-11 DIAGNOSIS — I10 Essential (primary) hypertension: Secondary | ICD-10-CM | POA: Diagnosis not present

## 2016-03-11 DIAGNOSIS — R413 Other amnesia: Secondary | ICD-10-CM | POA: Diagnosis not present

## 2016-03-11 DIAGNOSIS — Z7982 Long term (current) use of aspirin: Secondary | ICD-10-CM | POA: Diagnosis not present

## 2016-03-11 DIAGNOSIS — H9193 Unspecified hearing loss, bilateral: Secondary | ICD-10-CM | POA: Diagnosis not present

## 2016-03-13 DIAGNOSIS — I10 Essential (primary) hypertension: Secondary | ICD-10-CM | POA: Diagnosis not present

## 2016-03-13 DIAGNOSIS — H3530 Unspecified macular degeneration: Secondary | ICD-10-CM | POA: Diagnosis not present

## 2016-03-13 DIAGNOSIS — E785 Hyperlipidemia, unspecified: Secondary | ICD-10-CM | POA: Diagnosis not present

## 2016-03-13 DIAGNOSIS — I480 Paroxysmal atrial fibrillation: Secondary | ICD-10-CM | POA: Diagnosis not present

## 2016-03-13 DIAGNOSIS — Z9981 Dependence on supplemental oxygen: Secondary | ICD-10-CM | POA: Diagnosis not present

## 2016-03-13 DIAGNOSIS — R413 Other amnesia: Secondary | ICD-10-CM | POA: Diagnosis not present

## 2016-03-13 DIAGNOSIS — H9193 Unspecified hearing loss, bilateral: Secondary | ICD-10-CM | POA: Diagnosis not present

## 2016-03-13 DIAGNOSIS — J441 Chronic obstructive pulmonary disease with (acute) exacerbation: Secondary | ICD-10-CM | POA: Diagnosis not present

## 2016-03-13 DIAGNOSIS — Z7982 Long term (current) use of aspirin: Secondary | ICD-10-CM | POA: Diagnosis not present

## 2016-03-14 DIAGNOSIS — I480 Paroxysmal atrial fibrillation: Secondary | ICD-10-CM | POA: Diagnosis not present

## 2016-03-14 DIAGNOSIS — Z7982 Long term (current) use of aspirin: Secondary | ICD-10-CM | POA: Diagnosis not present

## 2016-03-14 DIAGNOSIS — I10 Essential (primary) hypertension: Secondary | ICD-10-CM | POA: Diagnosis not present

## 2016-03-14 DIAGNOSIS — H3530 Unspecified macular degeneration: Secondary | ICD-10-CM | POA: Diagnosis not present

## 2016-03-14 DIAGNOSIS — E785 Hyperlipidemia, unspecified: Secondary | ICD-10-CM | POA: Diagnosis not present

## 2016-03-14 DIAGNOSIS — H9193 Unspecified hearing loss, bilateral: Secondary | ICD-10-CM | POA: Diagnosis not present

## 2016-03-14 DIAGNOSIS — R413 Other amnesia: Secondary | ICD-10-CM | POA: Diagnosis not present

## 2016-03-14 DIAGNOSIS — J441 Chronic obstructive pulmonary disease with (acute) exacerbation: Secondary | ICD-10-CM | POA: Diagnosis not present

## 2016-03-14 DIAGNOSIS — Z9981 Dependence on supplemental oxygen: Secondary | ICD-10-CM | POA: Diagnosis not present

## 2016-03-15 DIAGNOSIS — H3530 Unspecified macular degeneration: Secondary | ICD-10-CM | POA: Diagnosis not present

## 2016-03-15 DIAGNOSIS — I10 Essential (primary) hypertension: Secondary | ICD-10-CM | POA: Diagnosis not present

## 2016-03-15 DIAGNOSIS — H9193 Unspecified hearing loss, bilateral: Secondary | ICD-10-CM | POA: Diagnosis not present

## 2016-03-15 DIAGNOSIS — J441 Chronic obstructive pulmonary disease with (acute) exacerbation: Secondary | ICD-10-CM | POA: Diagnosis not present

## 2016-03-15 DIAGNOSIS — Z9981 Dependence on supplemental oxygen: Secondary | ICD-10-CM | POA: Diagnosis not present

## 2016-03-15 DIAGNOSIS — Z7982 Long term (current) use of aspirin: Secondary | ICD-10-CM | POA: Diagnosis not present

## 2016-03-15 DIAGNOSIS — E785 Hyperlipidemia, unspecified: Secondary | ICD-10-CM | POA: Diagnosis not present

## 2016-03-15 DIAGNOSIS — I480 Paroxysmal atrial fibrillation: Secondary | ICD-10-CM | POA: Diagnosis not present

## 2016-03-15 DIAGNOSIS — R413 Other amnesia: Secondary | ICD-10-CM | POA: Diagnosis not present

## 2016-03-19 DIAGNOSIS — E785 Hyperlipidemia, unspecified: Secondary | ICD-10-CM | POA: Diagnosis not present

## 2016-03-19 DIAGNOSIS — H3530 Unspecified macular degeneration: Secondary | ICD-10-CM | POA: Diagnosis not present

## 2016-03-19 DIAGNOSIS — Z7982 Long term (current) use of aspirin: Secondary | ICD-10-CM | POA: Diagnosis not present

## 2016-03-19 DIAGNOSIS — I480 Paroxysmal atrial fibrillation: Secondary | ICD-10-CM | POA: Diagnosis not present

## 2016-03-19 DIAGNOSIS — Z9981 Dependence on supplemental oxygen: Secondary | ICD-10-CM | POA: Diagnosis not present

## 2016-03-19 DIAGNOSIS — J441 Chronic obstructive pulmonary disease with (acute) exacerbation: Secondary | ICD-10-CM | POA: Diagnosis not present

## 2016-03-19 DIAGNOSIS — R413 Other amnesia: Secondary | ICD-10-CM | POA: Diagnosis not present

## 2016-03-19 DIAGNOSIS — I10 Essential (primary) hypertension: Secondary | ICD-10-CM | POA: Diagnosis not present

## 2016-03-19 DIAGNOSIS — H9193 Unspecified hearing loss, bilateral: Secondary | ICD-10-CM | POA: Diagnosis not present

## 2016-03-20 DIAGNOSIS — R413 Other amnesia: Secondary | ICD-10-CM | POA: Diagnosis not present

## 2016-03-20 DIAGNOSIS — E785 Hyperlipidemia, unspecified: Secondary | ICD-10-CM | POA: Diagnosis not present

## 2016-03-20 DIAGNOSIS — Z7982 Long term (current) use of aspirin: Secondary | ICD-10-CM | POA: Diagnosis not present

## 2016-03-20 DIAGNOSIS — I10 Essential (primary) hypertension: Secondary | ICD-10-CM | POA: Diagnosis not present

## 2016-03-20 DIAGNOSIS — J441 Chronic obstructive pulmonary disease with (acute) exacerbation: Secondary | ICD-10-CM | POA: Diagnosis not present

## 2016-03-20 DIAGNOSIS — I480 Paroxysmal atrial fibrillation: Secondary | ICD-10-CM | POA: Diagnosis not present

## 2016-03-20 DIAGNOSIS — H9193 Unspecified hearing loss, bilateral: Secondary | ICD-10-CM | POA: Diagnosis not present

## 2016-03-20 DIAGNOSIS — H3530 Unspecified macular degeneration: Secondary | ICD-10-CM | POA: Diagnosis not present

## 2016-03-20 DIAGNOSIS — Z9981 Dependence on supplemental oxygen: Secondary | ICD-10-CM | POA: Diagnosis not present

## 2016-03-21 DIAGNOSIS — I10 Essential (primary) hypertension: Secondary | ICD-10-CM | POA: Diagnosis not present

## 2016-03-21 DIAGNOSIS — H3530 Unspecified macular degeneration: Secondary | ICD-10-CM | POA: Diagnosis not present

## 2016-03-21 DIAGNOSIS — Z7982 Long term (current) use of aspirin: Secondary | ICD-10-CM | POA: Diagnosis not present

## 2016-03-21 DIAGNOSIS — R413 Other amnesia: Secondary | ICD-10-CM | POA: Diagnosis not present

## 2016-03-21 DIAGNOSIS — Z9981 Dependence on supplemental oxygen: Secondary | ICD-10-CM | POA: Diagnosis not present

## 2016-03-21 DIAGNOSIS — E785 Hyperlipidemia, unspecified: Secondary | ICD-10-CM | POA: Diagnosis not present

## 2016-03-21 DIAGNOSIS — H9193 Unspecified hearing loss, bilateral: Secondary | ICD-10-CM | POA: Diagnosis not present

## 2016-03-21 DIAGNOSIS — J441 Chronic obstructive pulmonary disease with (acute) exacerbation: Secondary | ICD-10-CM | POA: Diagnosis not present

## 2016-03-21 DIAGNOSIS — I480 Paroxysmal atrial fibrillation: Secondary | ICD-10-CM | POA: Diagnosis not present

## 2016-03-25 DIAGNOSIS — J441 Chronic obstructive pulmonary disease with (acute) exacerbation: Secondary | ICD-10-CM | POA: Diagnosis not present

## 2016-03-25 DIAGNOSIS — Z9981 Dependence on supplemental oxygen: Secondary | ICD-10-CM | POA: Diagnosis not present

## 2016-03-25 DIAGNOSIS — H3530 Unspecified macular degeneration: Secondary | ICD-10-CM | POA: Diagnosis not present

## 2016-03-25 DIAGNOSIS — I10 Essential (primary) hypertension: Secondary | ICD-10-CM | POA: Diagnosis not present

## 2016-03-25 DIAGNOSIS — H9193 Unspecified hearing loss, bilateral: Secondary | ICD-10-CM | POA: Diagnosis not present

## 2016-03-25 DIAGNOSIS — R413 Other amnesia: Secondary | ICD-10-CM | POA: Diagnosis not present

## 2016-03-25 DIAGNOSIS — E785 Hyperlipidemia, unspecified: Secondary | ICD-10-CM | POA: Diagnosis not present

## 2016-03-25 DIAGNOSIS — Z7982 Long term (current) use of aspirin: Secondary | ICD-10-CM | POA: Diagnosis not present

## 2016-03-25 DIAGNOSIS — I480 Paroxysmal atrial fibrillation: Secondary | ICD-10-CM | POA: Diagnosis not present

## 2016-03-26 DIAGNOSIS — H9193 Unspecified hearing loss, bilateral: Secondary | ICD-10-CM | POA: Diagnosis not present

## 2016-03-26 DIAGNOSIS — H3530 Unspecified macular degeneration: Secondary | ICD-10-CM | POA: Diagnosis not present

## 2016-03-26 DIAGNOSIS — E785 Hyperlipidemia, unspecified: Secondary | ICD-10-CM | POA: Diagnosis not present

## 2016-03-26 DIAGNOSIS — J441 Chronic obstructive pulmonary disease with (acute) exacerbation: Secondary | ICD-10-CM | POA: Diagnosis not present

## 2016-03-26 DIAGNOSIS — R413 Other amnesia: Secondary | ICD-10-CM | POA: Diagnosis not present

## 2016-03-26 DIAGNOSIS — Z9981 Dependence on supplemental oxygen: Secondary | ICD-10-CM | POA: Diagnosis not present

## 2016-03-26 DIAGNOSIS — I480 Paroxysmal atrial fibrillation: Secondary | ICD-10-CM | POA: Diagnosis not present

## 2016-03-26 DIAGNOSIS — Z7982 Long term (current) use of aspirin: Secondary | ICD-10-CM | POA: Diagnosis not present

## 2016-03-26 DIAGNOSIS — I10 Essential (primary) hypertension: Secondary | ICD-10-CM | POA: Diagnosis not present

## 2016-03-27 DIAGNOSIS — E785 Hyperlipidemia, unspecified: Secondary | ICD-10-CM | POA: Diagnosis not present

## 2016-03-27 DIAGNOSIS — H9193 Unspecified hearing loss, bilateral: Secondary | ICD-10-CM | POA: Diagnosis not present

## 2016-03-27 DIAGNOSIS — H3530 Unspecified macular degeneration: Secondary | ICD-10-CM | POA: Diagnosis not present

## 2016-03-27 DIAGNOSIS — Z7982 Long term (current) use of aspirin: Secondary | ICD-10-CM | POA: Diagnosis not present

## 2016-03-27 DIAGNOSIS — I480 Paroxysmal atrial fibrillation: Secondary | ICD-10-CM | POA: Diagnosis not present

## 2016-03-27 DIAGNOSIS — I10 Essential (primary) hypertension: Secondary | ICD-10-CM | POA: Diagnosis not present

## 2016-03-27 DIAGNOSIS — J441 Chronic obstructive pulmonary disease with (acute) exacerbation: Secondary | ICD-10-CM | POA: Diagnosis not present

## 2016-03-27 DIAGNOSIS — Z9981 Dependence on supplemental oxygen: Secondary | ICD-10-CM | POA: Diagnosis not present

## 2016-03-27 DIAGNOSIS — R413 Other amnesia: Secondary | ICD-10-CM | POA: Diagnosis not present

## 2016-03-28 DIAGNOSIS — E785 Hyperlipidemia, unspecified: Secondary | ICD-10-CM | POA: Diagnosis not present

## 2016-03-28 DIAGNOSIS — R413 Other amnesia: Secondary | ICD-10-CM | POA: Diagnosis not present

## 2016-03-28 DIAGNOSIS — H9193 Unspecified hearing loss, bilateral: Secondary | ICD-10-CM | POA: Diagnosis not present

## 2016-03-28 DIAGNOSIS — J441 Chronic obstructive pulmonary disease with (acute) exacerbation: Secondary | ICD-10-CM | POA: Diagnosis not present

## 2016-03-28 DIAGNOSIS — Z7982 Long term (current) use of aspirin: Secondary | ICD-10-CM | POA: Diagnosis not present

## 2016-03-28 DIAGNOSIS — I10 Essential (primary) hypertension: Secondary | ICD-10-CM | POA: Diagnosis not present

## 2016-03-28 DIAGNOSIS — H3530 Unspecified macular degeneration: Secondary | ICD-10-CM | POA: Diagnosis not present

## 2016-03-28 DIAGNOSIS — Z9981 Dependence on supplemental oxygen: Secondary | ICD-10-CM | POA: Diagnosis not present

## 2016-03-28 DIAGNOSIS — I480 Paroxysmal atrial fibrillation: Secondary | ICD-10-CM | POA: Diagnosis not present

## 2016-03-31 DIAGNOSIS — J449 Chronic obstructive pulmonary disease, unspecified: Secondary | ICD-10-CM | POA: Diagnosis not present

## 2016-04-01 DIAGNOSIS — H9193 Unspecified hearing loss, bilateral: Secondary | ICD-10-CM | POA: Diagnosis not present

## 2016-04-01 DIAGNOSIS — R413 Other amnesia: Secondary | ICD-10-CM | POA: Diagnosis not present

## 2016-04-01 DIAGNOSIS — H3530 Unspecified macular degeneration: Secondary | ICD-10-CM | POA: Diagnosis not present

## 2016-04-01 DIAGNOSIS — I480 Paroxysmal atrial fibrillation: Secondary | ICD-10-CM | POA: Diagnosis not present

## 2016-04-01 DIAGNOSIS — Z9981 Dependence on supplemental oxygen: Secondary | ICD-10-CM | POA: Diagnosis not present

## 2016-04-01 DIAGNOSIS — E785 Hyperlipidemia, unspecified: Secondary | ICD-10-CM | POA: Diagnosis not present

## 2016-04-01 DIAGNOSIS — I10 Essential (primary) hypertension: Secondary | ICD-10-CM | POA: Diagnosis not present

## 2016-04-01 DIAGNOSIS — Z7982 Long term (current) use of aspirin: Secondary | ICD-10-CM | POA: Diagnosis not present

## 2016-04-01 DIAGNOSIS — J441 Chronic obstructive pulmonary disease with (acute) exacerbation: Secondary | ICD-10-CM | POA: Diagnosis not present

## 2016-04-03 DIAGNOSIS — H9193 Unspecified hearing loss, bilateral: Secondary | ICD-10-CM | POA: Diagnosis not present

## 2016-04-03 DIAGNOSIS — E785 Hyperlipidemia, unspecified: Secondary | ICD-10-CM | POA: Diagnosis not present

## 2016-04-03 DIAGNOSIS — H3530 Unspecified macular degeneration: Secondary | ICD-10-CM | POA: Diagnosis not present

## 2016-04-03 DIAGNOSIS — R413 Other amnesia: Secondary | ICD-10-CM | POA: Diagnosis not present

## 2016-04-03 DIAGNOSIS — Z7982 Long term (current) use of aspirin: Secondary | ICD-10-CM | POA: Diagnosis not present

## 2016-04-03 DIAGNOSIS — I10 Essential (primary) hypertension: Secondary | ICD-10-CM | POA: Diagnosis not present

## 2016-04-03 DIAGNOSIS — I480 Paroxysmal atrial fibrillation: Secondary | ICD-10-CM | POA: Diagnosis not present

## 2016-04-03 DIAGNOSIS — Z9981 Dependence on supplemental oxygen: Secondary | ICD-10-CM | POA: Diagnosis not present

## 2016-04-03 DIAGNOSIS — J441 Chronic obstructive pulmonary disease with (acute) exacerbation: Secondary | ICD-10-CM | POA: Diagnosis not present

## 2016-04-04 DIAGNOSIS — R413 Other amnesia: Secondary | ICD-10-CM | POA: Diagnosis not present

## 2016-04-04 DIAGNOSIS — J441 Chronic obstructive pulmonary disease with (acute) exacerbation: Secondary | ICD-10-CM | POA: Diagnosis not present

## 2016-04-04 DIAGNOSIS — Z9981 Dependence on supplemental oxygen: Secondary | ICD-10-CM | POA: Diagnosis not present

## 2016-04-04 DIAGNOSIS — I480 Paroxysmal atrial fibrillation: Secondary | ICD-10-CM | POA: Diagnosis not present

## 2016-04-04 DIAGNOSIS — H9193 Unspecified hearing loss, bilateral: Secondary | ICD-10-CM | POA: Diagnosis not present

## 2016-04-04 DIAGNOSIS — H3530 Unspecified macular degeneration: Secondary | ICD-10-CM | POA: Diagnosis not present

## 2016-04-04 DIAGNOSIS — E785 Hyperlipidemia, unspecified: Secondary | ICD-10-CM | POA: Diagnosis not present

## 2016-04-04 DIAGNOSIS — Z7982 Long term (current) use of aspirin: Secondary | ICD-10-CM | POA: Diagnosis not present

## 2016-04-04 DIAGNOSIS — I10 Essential (primary) hypertension: Secondary | ICD-10-CM | POA: Diagnosis not present

## 2016-04-30 DIAGNOSIS — J449 Chronic obstructive pulmonary disease, unspecified: Secondary | ICD-10-CM | POA: Diagnosis not present

## 2016-05-28 ENCOUNTER — Ambulatory Visit (INDEPENDENT_AMBULATORY_CARE_PROVIDER_SITE_OTHER): Payer: Medicare Other | Admitting: Urgent Care

## 2016-05-28 ENCOUNTER — Encounter: Payer: Self-pay | Admitting: Urgent Care

## 2016-05-28 VITALS — BP 132/74 | HR 72 | Temp 98.2°F | Resp 15 | Ht 66.0 in | Wt 157.0 lb

## 2016-05-28 DIAGNOSIS — I480 Paroxysmal atrial fibrillation: Secondary | ICD-10-CM

## 2016-05-28 DIAGNOSIS — J410 Simple chronic bronchitis: Secondary | ICD-10-CM

## 2016-05-28 DIAGNOSIS — I10 Essential (primary) hypertension: Secondary | ICD-10-CM | POA: Diagnosis not present

## 2016-05-28 DIAGNOSIS — R609 Edema, unspecified: Secondary | ICD-10-CM | POA: Diagnosis not present

## 2016-05-28 LAB — POCT CBC
Granulocyte percent: 69.1 %G (ref 37–80)
HEMATOCRIT: 26.6 % — AB (ref 37.7–47.9)
HEMOGLOBIN: 8 g/dL — AB (ref 12.2–16.2)
LYMPH, POC: 2.4 (ref 0.6–3.4)
MCH, POC: 22.2 pg — AB (ref 27–31.2)
MCHC: 30.2 g/dL — AB (ref 31.8–35.4)
MCV: 73.7 fL — AB (ref 80–97)
MID (CBC): 0.8 (ref 0–0.9)
MPV: 7.9 fL (ref 0–99.8)
POC GRANULOCYTE: 7 — AB (ref 2–6.9)
POC LYMPH %: 23.3 % (ref 10–50)
POC MID %: 7.6 % (ref 0–12)
Platelet Count, POC: 332 10*3/uL (ref 142–424)
RBC: 3.6 M/uL — AB (ref 4.04–5.48)
RDW, POC: 17.6 %
WBC: 10.2 10*3/uL (ref 4.6–10.2)

## 2016-05-28 LAB — POCT URINALYSIS DIP (MANUAL ENTRY)
BILIRUBIN UA: NEGATIVE
BILIRUBIN UA: NEGATIVE
Blood, UA: NEGATIVE
GLUCOSE UA: NEGATIVE
NITRITE UA: NEGATIVE
Protein Ur, POC: NEGATIVE
Spec Grav, UA: 1.015
Urobilinogen, UA: 0.2
pH, UA: 6.5

## 2016-05-28 LAB — POC MICROSCOPIC URINALYSIS (UMFC): MUCUS RE: ABSENT

## 2016-05-28 NOTE — Patient Instructions (Addendum)
Please purchase compression stockings with a pressure of 10-52mmHg. I will call you with the results and decide on our next step then.   Edema Edema is an abnormal buildup of fluids in your bodytissues. Edema is somewhatdependent on gravity to pull the fluid to the lowest place in your body. That makes the condition more common in the legs and thighs (lower extremities). Painless swelling of the feet and ankles is common and becomes more likely as you get older. It is also common in looser tissues, like around your eyes.  When the affected area is squeezed, the fluid may move out of that spot and leave a dent for a few moments. This dent is called pitting.  CAUSES  There are many possible causes of edema. Eating too much salt and being on your feet or sitting for a long time can cause edema in your legs and ankles. Hot weather may make edema worse. Common medical causes of edema include:  Heart failure.  Liver disease.  Kidney disease.  Weak blood vessels in your legs.  Cancer.  An injury.  Pregnancy.  Some medications.  Obesity. SYMPTOMS  Edema is usually painless.Your skin may look swollen or shiny.  DIAGNOSIS  Your health care provider may be able to diagnose edema by asking about your medical history and doing a physical exam. You may need to have tests such as X-rays, an electrocardiogram, or blood tests to check for medical conditions that may cause edema.  TREATMENT  Edema treatment depends on the cause. If you have heart, liver, or kidney disease, you need the treatment appropriate for these conditions. General treatment may include:  Elevation of the affected body part above the level of your heart.  Compression of the affected body part. Pressure from elastic bandages or support stockings squeezes the tissues and forces fluid back into the blood vessels. This keeps fluid from entering the tissues.  Restriction of fluid and salt intake.  Use of a water pill  (diuretic). These medications are appropriate only for some types of edema. They pull fluid out of your body and make you urinate more often. This gets rid of fluid and reduces swelling, but diuretics can have side effects. Only use diuretics as directed by your health care provider. HOME CARE INSTRUCTIONS   Keep the affected body part above the level of your heart when you are lying down.   Do not sit still or stand for prolonged periods.   Do not put anything directly under your knees when lying down.  Do not wear constricting clothing or garters on your upper legs.   Exercise your legs to work the fluid back into your blood vessels. This may help the swelling go down.   Wear elastic bandages or support stockings to reduce ankle swelling as directed by your health care provider.   Eat a low-salt diet to reduce fluid if your health care provider recommends it.   Only take medicines as directed by your health care provider. SEEK MEDICAL CARE IF:   Your edema is not responding to treatment.  You have heart, liver, or kidney disease and notice symptoms of edema.  You have edema in your legs that does not improve after elevating them.   You have sudden and unexplained weight gain. SEEK IMMEDIATE MEDICAL CARE IF:   You develop shortness of breath or chest pain.   You cannot breathe when you lie down.  You develop pain, redness, or warmth in the swollen areas.  You have heart, liver, or kidney disease and suddenly get edema.  You have a fever and your symptoms suddenly get worse. MAKE SURE YOU:   Understand these instructions.  Will watch your condition.  Will get help right away if you are not doing well or get worse.   This information is not intended to replace advice given to you by your health care provider. Make sure you discuss any questions you have with your health care provider.   Document Released: 10/07/2005 Document Revised: 10/28/2014 Document  Reviewed: 07/30/2013 Elsevier Interactive Patient Education 2016 [employer].     IF you received an x-ray today, you will receive an invoice from [facility]. Please contact [facility] at [phone] with questions or concerns regarding your invoice.   IF you received labwork today, you will receive an invoice from [employer]. Please contact [facility] at [phone] with questions or concerns regarding your invoice.   Our billing staff will not be able to assist you with questions regarding bills from these companies.  You will be contacted with the lab results as soon as they are available. The fastest way to get your results is to activate your My Chart account. Instructions are located on the last page of this paperwork. If you have not heard from [place] regarding the results in 2 weeks, please contact this office.

## 2016-05-28 NOTE — Progress Notes (Addendum)
MRN: [id number] DOB: 07/12/1924  Subjective:   [name] is a 82 y.o. female presenting for chief complaint of Leg Swelling (Bilateral. Started in march ) and Foot Swelling (Bilateral. Started in march.)  Reports 2 month history of intermittent lower leg swelling. She has tried to prop her legs up with some temporary improvement. Denies chest pain, shob, heart racing, heart fluttering, weight gain, erythematous calves, painful calves, dyspnea, orthopnea. Patient last saw her cardiologist, Dr. Gwenlyn Found ~6 months ago. She continues to smoke against medical advice. He manages her atrial fibrillation, HTN, has a history of LBBB.   [name] has a current medication list which includes the following prescription(s): albuterol, aspirin, atorvastatin, budesonide, diazepam, fish oil-omega-3 fatty acids, ipratropium, metoprolol succinate, oxygen-helium, ferrous sulfate, ipratropium, meclizine, and verapamil. Also is allergic to cefuroxime.  [name]  has a past medical history of Cataract; Chest pain; COPD (chronic obstructive pulmonary disease) ([place]); Debility (01/21/2016); Hearing loss (01/21/2016); Hyperglycemia (01/21/2016); Hyperlipidemia; Hypertension; Left bundle branch block; Macular degeneration (01/21/2016); Major depression ([place]) (01/29/2016); Memory loss (01/21/2016); Paroxysmal atrial fibrillation ([place]); Personal history of subdural hematoma; Subdural hematoma ([place]) (2009); and Tobacco abuse. Also  has a past surgical history that includes Eye surgery; Abdominal hysterectomy; Appendectomy; and Brain surgery.  Objective:   Vitals: BP 132/74 (BP Location: Left Arm, Patient Position: Sitting, Cuff Size: Normal)   Pulse 72   Temp 98.2 F (36.8 C) (Oral)   Resp 15   Ht 5\' 6"  (1.676 m)   Wt 157 lb (71.2 kg)   SpO2 90%   BMI 25.34 kg/m   Wt Readings from Last 3 Encounters:  05/28/16 157 lb (71.2 kg)  02/07/16 154 lb (69.9 kg)  01/29/16 152 lb (68.9 kg)    BP Readings from Last 3 Encounters:  05/28/16  132/74  02/07/16 124/70  01/29/16 128/74    Physical Exam  Constitutional: She is oriented to person, place, and time. She appears well-developed and well-nourished.  HENT:  Mouth/Throat: Oropharynx is clear and moist.  Eyes: No scleral icterus.  Cardiovascular: Normal rate, regular rhythm and intact distal pulses.  Exam reveals no gallop and no friction rub.   No murmur heard. Pulmonary/Chest: No respiratory distress. She has no wheezes. She has no rales.  Musculoskeletal: She exhibits edema (1+ pitting edema up to right knee and up to mid left calf). She exhibits no tenderness.  Negative Homann sign.  Neurological: She is alert and oriented to person, place, and time.  Skin: Skin is warm and dry.   Results for orders placed or performed in visit on 05/28/16 (from the past 24 hour(s))  POCT CBC     Status: Abnormal   Collection Time: 05/28/16  3:26 PM  Result Value Ref Range   WBC 10.2 4.6 - 10.2 K/uL   Lymph, poc 2.4 0.6 - 3.4   POC LYMPH PERCENT 23.3 10 - 50 %L   MID (cbc) 0.8 0 - 0.9   POC MID % 7.6 0 - 12 %M   POC Granulocyte 7.0 (A) 2 - 6.9   Granulocyte percent 69.1 37 - 80 %G   RBC 3.60 (A) 4.04 - 5.48 M/uL   Hemoglobin 8.0 (A) 12.2 - 16.2 g/dL   HCT, POC 26.6 (A) 37.7 - 47.9 %   MCV 73.7 (A) 80 - 97 fL   MCH, POC 22.2 (A) 27 - 31.2 pg   MCHC 30.2 (A) 31.8 - 35.4 g/dL   RDW, POC 17.6 %   Platelet Count, POC 332 142 -  424 K/uL   MPV 7.9 0 - 99.8 fL  POCT urinalysis dipstick     Status: Abnormal   Collection Time: 05/28/16  3:26 PM  Result Value Ref Range   Color, UA yellow yellow   Clarity, UA clear clear   Glucose, UA negative negative   Bilirubin, UA negative negative   Ketones, POC UA negative negative   Spec Grav, UA 1.015    Blood, UA negative negative   pH, UA 6.5    Protein Ur, POC negative negative   Urobilinogen, UA 0.2    Nitrite, UA Negative Negative   Leukocytes, UA Trace (A) Negative  POCT Microscopic Urinalysis ([facility])     Status: Abnormal    Collection Time: 05/28/16  3:32 PM  Result Value Ref Range   WBC,UR,HPF,POC Few (A) None WBC/hpf   RBC,UR,HPF,POC None None RBC/hpf   Bacteria None None, Too numerous to count   Mucus Absent Absent   Epithelial Cells, UR Per Microscopy None None, Too numerous to count cells/hpf    Assessment and Plan :   1. Paroxysmal atrial fibrillation (HCC) 2. Essential hypertension 3. Simple chronic bronchitis ([place]) 4. Peripheral edema - Discussed sources for her lower leg swelling including cardiac, pulmonary, venous insufficiency versus malignancy. Patient refused any further work up. She did not want me to pursue a referral back to her pulmonologist or cardiologist Dr. Alvester Chou. She agreed to use compression stockings, I will follow up with results and see if she is in agreement for referrals at that point.   Brandi Eagles, PA-C [facility] [phone] 05/28/2016 2:46 PM

## 2016-05-29 LAB — COMPREHENSIVE METABOLIC PANEL
ALBUMIN: 4 g/dL (ref 3.6–5.1)
ALK PHOS: 87 U/L (ref 33–130)
ALT: 8 U/L (ref 6–29)
AST: 12 U/L (ref 10–35)
BUN: 17 mg/dL (ref 7–25)
CHLORIDE: 100 mmol/L (ref 98–110)
CO2: 32 mmol/L — ABNORMAL HIGH (ref 20–31)
CREATININE: 0.65 mg/dL (ref 0.60–0.88)
Calcium: 9 mg/dL (ref 8.6–10.4)
Glucose, Bld: 102 mg/dL — ABNORMAL HIGH (ref 65–99)
POTASSIUM: 4.8 mmol/L (ref 3.5–5.3)
Sodium: 144 mmol/L (ref 135–146)
TOTAL PROTEIN: 6.5 g/dL (ref 6.1–8.1)
Total Bilirubin: 0.3 mg/dL (ref 0.2–1.2)

## 2016-05-29 LAB — BRAIN NATRIURETIC PEPTIDE: Brain Natriuretic Peptide: 174.5 pg/mL — ABNORMAL HIGH (ref <100)

## 2016-05-30 ENCOUNTER — Other Ambulatory Visit: Payer: Self-pay | Admitting: Urgent Care

## 2016-05-30 DIAGNOSIS — R7989 Other specified abnormal findings of blood chemistry: Secondary | ICD-10-CM

## 2016-05-30 DIAGNOSIS — R609 Edema, unspecified: Secondary | ICD-10-CM

## 2016-05-30 DIAGNOSIS — I4891 Unspecified atrial fibrillation: Secondary | ICD-10-CM

## 2016-05-31 DIAGNOSIS — J449 Chronic obstructive pulmonary disease, unspecified: Secondary | ICD-10-CM | POA: Diagnosis not present

## 2016-06-06 ENCOUNTER — Telehealth: Payer: Self-pay

## 2016-06-06 DIAGNOSIS — I480 Paroxysmal atrial fibrillation: Secondary | ICD-10-CM

## 2016-06-06 DIAGNOSIS — R2240 Localized swelling, mass and lump, unspecified lower limb: Secondary | ICD-10-CM

## 2016-06-06 DIAGNOSIS — I10 Essential (primary) hypertension: Secondary | ICD-10-CM

## 2016-06-06 NOTE — Telephone Encounter (Signed)
Pt was seen on 05/28/16 by Dr. Carlota Raspberry for Paroxysmal atrial fibrillation ([place]) +3 more. She would like a CB concerning her last OV. Please advise at [phone]

## 2016-06-07 NOTE — Telephone Encounter (Signed)
Patient would like to know if you tried to reach Dr. Alvester Chou as you guys discuss at her appointment.

## 2016-06-08 NOTE — Telephone Encounter (Signed)
Patient seen by Rosario Adie, PA-C. I'm routing the message to him.

## 2016-06-10 ENCOUNTER — Telehealth: Payer: Self-pay | Admitting: Cardiovascular Disease

## 2016-06-10 NOTE — Telephone Encounter (Signed)
Spoke with pt, she reports swelling in her feet and legs since march. They are swollen all the time and feel tight. She denies SOB but is not active and wears oxygen all the time. She does elevate her legs while sitting, does not really watch her salt. She does not weigh. She has support hose but is unable to use them because she can not get them on. She reports seeing her medical doctor for the swelling but they told her she would need to see her cardiologist. Pt educated on salt and fluid restrictions and to elevated legs and feet while sitting. Follow up scheduled with hao meng.

## 2016-06-10 NOTE — Telephone Encounter (Signed)
Patient had previously declined referrals to Dr. Alvester Chou against my recommendations. I placed an urgent referral for this to happen.

## 2016-06-10 NOTE — Telephone Encounter (Signed)
[name] is calling because her legs and feet  are swollen and they are really tight . Please call   Thanks

## 2016-06-28 ENCOUNTER — Ambulatory Visit (INDEPENDENT_AMBULATORY_CARE_PROVIDER_SITE_OTHER): Payer: Medicare Other | Admitting: Physician Assistant

## 2016-06-28 ENCOUNTER — Encounter: Payer: Self-pay | Admitting: Physician Assistant

## 2016-06-28 VITALS — BP 156/58 | HR 65 | Ht 66.0 in | Wt 158.0 lb

## 2016-06-28 DIAGNOSIS — E785 Hyperlipidemia, unspecified: Secondary | ICD-10-CM

## 2016-06-28 DIAGNOSIS — I10 Essential (primary) hypertension: Secondary | ICD-10-CM | POA: Diagnosis not present

## 2016-06-28 DIAGNOSIS — I480 Paroxysmal atrial fibrillation: Secondary | ICD-10-CM | POA: Diagnosis not present

## 2016-06-28 DIAGNOSIS — M7989 Other specified soft tissue disorders: Secondary | ICD-10-CM | POA: Diagnosis not present

## 2016-06-28 DIAGNOSIS — Z720 Tobacco use: Secondary | ICD-10-CM | POA: Diagnosis not present

## 2016-06-28 LAB — BASIC METABOLIC PANEL
BUN: 19 mg/dL (ref 7–25)
CO2: 31 mmol/L (ref 20–31)
CREATININE: 0.57 mg/dL — AB (ref 0.60–0.88)
Calcium: 9.1 mg/dL (ref 8.6–10.4)
Chloride: 104 mmol/L (ref 98–110)
GLUCOSE: 86 mg/dL (ref 65–99)
Potassium: 4.3 mmol/L (ref 3.5–5.3)
SODIUM: 142 mmol/L (ref 135–146)

## 2016-06-28 LAB — CBC WITH DIFFERENTIAL/PLATELET
BASOS ABS: 0 {cells}/uL (ref 0–200)
Basophils Relative: 0 %
EOS ABS: 552 {cells}/uL — AB (ref 15–500)
Eosinophils Relative: 6 %
HCT: 25.8 % — ABNORMAL LOW (ref 35.0–45.0)
HEMOGLOBIN: 7 g/dL — AB (ref 11.7–15.5)
LYMPHS ABS: 2024 {cells}/uL (ref 850–3900)
Lymphocytes Relative: 22 %
MCH: 20.1 pg — ABNORMAL LOW (ref 27.0–33.0)
MCHC: 26.7 g/dL — ABNORMAL LOW (ref 32.0–36.0)
MCV: 74.1 fL — AB (ref 80.0–100.0)
MPV: 9.8 fL (ref 7.5–12.5)
Monocytes Absolute: 920 cells/uL (ref 200–950)
Monocytes Relative: 10 %
NEUTROS ABS: 5704 {cells}/uL (ref 1500–7800)
NEUTROS PCT: 62 %
Platelets: 415 10*3/uL — ABNORMAL HIGH (ref 140–400)
RBC: 3.48 MIL/uL — ABNORMAL LOW (ref 3.80–5.10)
RDW: 16.3 % — ABNORMAL HIGH (ref 11.0–15.0)
WBC: 9.2 10*3/uL (ref 3.8–10.8)

## 2016-06-28 LAB — TSH: TSH: 1.93 m[IU]/L

## 2016-06-28 NOTE — Patient Instructions (Signed)
Increase Metoprolol to 50 mg twice a day   Start Lasix 20 mg daily    Lab work today ( cbc,bmet,tsh )  Lab work in 1 week Friday 07/05/16   Schedule Echo   Your physician recommends that you schedule a follow-up appointment in: 2 weeks with Dr.Berry or Almyra Deforest PA if schedule appointment with PA schedule on a day when Dr.Berry in office.

## 2016-06-28 NOTE — Progress Notes (Signed)
Cardiology Office Note    Date:  06/28/2016   ID:  [name], DOB 09-30-1924, MRN [id number]  PCP:  Merrilee Seashore, MD  Cardiologist:  Dr. Gwenlyn Found  Chief Complaint  Patient presents with  . Follow-up    seen for Dr. Gwenlyn Found    History of Present Illness:  [name] is a 82 y.o. female with PMH of tobacco abuse, hypertension, hyperlipidemia, PAF and a family history of CAD with her brother having bypass surgery and father died of sudden cardiac death. She never had his heart attack or stroke. She did have episodes of chest pain the past, however echocardiogram and Myoview was normal, therefore her chest pain was attributed to noncardiac causes. Her last echocardiogram obtained on 08/16/2013 showed EF 60-65%, severe septal hypertrophy with associated SAM of the mitral valve suggesting HOCM, grade 1 diastolic dysfunction, mild MR. It was felt that some of her dyspnea may be related to low preload in the setting of dynamic LVOT obstruction based on echocardiogram. The last Myoview obtained in October 2014 was normal. Patient's last office visit was on 11/14/2015, she still have chest pain, however has not change in frequency or severity. It appears patient was admitted in the hospital in 12/2015 for community-acquired pneumonia, during the hospitalization, it was mentioned that she had a history of paroxysmal atrial fibrillation, but it appears she was able to stay in NSR during her admission. Due to her advanced age, she was felt not to be a candidate for systemic anticoagulation therapy.  She was recently seen in the urgent care 05/28/2016 for bilateral foot swelling started in March. It was discussed with the patient regarding etiology of heart lower leg swelling could be cardiac, pulmonary or venous insufficiency, she refused any further workup at the time. She was agreeable to use compression stocking. She called a cardiology office on 06/10/2016 to report persistent swelling of her feet and  leg since March. She presents today for further evaluation. She denies any paroxysmal nocturnal dyspnea symptoms. She does have occasional sharp shooting chest discomfort. She has some degree of baseline dementia and hearing problem. On physical exam, she does have diminished breath sound bilaterally, however I did not appreciate significant sign of fluid overload when listening to her lung. She has at least 2-3+ pitting edema bilaterally, review of recent records shows her hemoglobin has dropped significantly since March. I will obtain a CBC to make sure she is not anemic. As for the swelling, I will also obtain a basic metabolic panel and TSH along with outpatient echocardiogram. We will also repeat a basic metabolic panel in one week after starting her on 20 mg daily of Lasix. Given her age, I think it is best to pursue conservative therapy and I would avoid any aggressive measures. Also her blood pressure is elevated today at 156/58, looking over her previous record, her blood pressure has always been elevated but never as high as this. She says her metoprolol was increased to 50 mg in a.m. and 25 mg in p.m. Despite this, her blood pressure is still elevated, her heart rate today is 65, I will increase her metoprolol to 50 mg twice a day to better control her blood pressure. We'll see her back in 2 weeks to reassess volume status. /  Past Medical History:  Diagnosis Date  . Cataract   . Chest pain   . COPD (chronic obstructive pulmonary disease) ([place])   . Debility 01/21/2016  . Hearing loss 01/21/2016  . Hyperglycemia  01/21/2016  . Hyperlipidemia   . Hypertension   . Left bundle branch block   . Macular degeneration 01/21/2016  . Major depression ([place]) 01/29/2016  . Memory loss 01/21/2016  . Paroxysmal atrial fibrillation (HCC)   . Personal history of subdural hematoma   . Subdural hematoma ([place]) 2009  . Tobacco abuse     Past Surgical History:  Procedure Laterality Date  . ABDOMINAL HYSTERECTOMY     . APPENDECTOMY    . BRAIN SURGERY    . EYE SURGERY      Current Medications: Outpatient Medications Prior to Visit  Medication Sig Dispense Refill  . albuterol (PROVENTIL HFA;VENTOLIN HFA) 108 (90 BASE) MCG/ACT inhaler Inhale 2 puffs into the lungs every 6 (six) hours as needed for wheezing or shortness of breath.     Marland Kitchen aspirin 325 MG tablet Take 325 mg by mouth daily.    Marland Kitchen atorvastatin (LIPITOR) 40 MG tablet Take 40 mg by mouth every other day. Every other day    . budesonide (PULMICORT) 0.5 MG/2ML nebulizer solution Take 0.5 mg by nebulization 2 (two) times daily.    . diazepam (VALIUM) 5 MG tablet TAKE 1 TABLET BY MOUTH EVERY DAY AS NEEDED FOR ANXIETY 30 tablet 0  . ferrous sulfate 325 (65 FE) MG tablet Take 325 mg by mouth daily with breakfast.    . fish oil-omega-3 fatty acids 1000 MG capsule Take 1 g by mouth 2 (two) times daily.     Marland Kitchen ipratropium (ATROVENT) 0.02 % nebulizer solution Take 1.25 mLs (250 mcg total) by nebulization 4 (four) times daily. 75 mL 12  . ipratropium (ATROVENT) 0.02 % nebulizer solution Take 0.5 mg by nebulization 4 (four) times daily.    . meclizine (ANTIVERT) 12.5 MG tablet Take 1 tablet (12.5 mg total) by mouth 3 (three) times daily. 21 tablet 0  . metoprolol succinate (TOPROL-XL) 50 MG 24 hr tablet Take 1 tablet (50 mg total) by mouth daily. Take with or immediately following a meal. 30 tablet 0  . OXYGEN Inhale 2 L into the lungs at bedtime. Reported on 12/04/2015    . verapamil (COVERA HS) 240 MG (CO) 24 hr tablet Take 240 mg by mouth daily.      No facility-administered medications prior to visit.      Allergies:   Cefuroxime   Social History   Social History  . Marital status: Divorced    Spouse name: N/A  . Number of children: N/A  . Years of education: N/A   Social History Main Topics  . Smoking status: Current Every Day Smoker    Packs/day: 1.00    Years: 73.00    Types: Cigarettes    Start date: 07/30/1945  . Smokeless tobacco: Never  Used  . Alcohol use No  . Drug use: No  . Sexual activity: Not Asked   Other Topics Concern  . None   Social History Narrative  . None     Family History:  The patient's family history includes Diabetes in her mother; Heart disease in her father and mother.   ROS:   Please see the history of present illness.    ROS All other systems reviewed and are negative.   PHYSICAL EXAM:   VS:  BP (!) 156/58   Pulse 65   Ht 5\' 6"  (1.676 m)   Wt 158 lb (71.7 kg)   BMI 25.50 kg/m    GEN: Well nourished, well developed, in no acute distress  HEENT: normal  Neck: no JVD, carotid bruits, or masses Cardiac: RRR; no murmurs, rubs, or gallops. 2-3+ pitting edema bilaterally Respiratory:  clear to auscultation bilaterally, normal work of breathing GI: soft, nontender, nondistended, + BS MS: no deformity or atrophy  Skin: warm and dry, no rash Neuro:  Alert and Oriented x 3, Strength and sensation are intact Psych: euthymic mood, full affect  Wt Readings from Last 3 Encounters:  06/28/16 158 lb (71.7 kg)  05/28/16 157 lb (71.2 kg)  02/07/16 154 lb (69.9 kg)      Studies/Labs Reviewed:   EKG:  EKG is ordered today.  The ekg ordered today demonstrates NSR with LBBB  Recent Labs: 01/17/2016: Platelets 451 05/28/2016: ALT 8; Brain Natriuretic Peptide 174.5; BUN 17; Creat 0.65; Hemoglobin 8.0; Potassium 4.8; Sodium 144   Lipid Panel No results found for: CHOL, TRIG, HDL, CHOLHDL, VLDL, LDLCALC, LDLDIRECT  Additional studies/ records that were reviewed today include:   Echo 08/16/2013 LV EF: 60% -  65%  ------------------------------------------------------------ Indications:   786.05 Dyspnea.  ------------------------------------------------------------ History:  PMH: Hyperlipidemia Atrial fibrillation. Chronic obstructive pulmonary disease.  ------------------------------------------------------------ Study Conclusions  - Left ventricle: The cavity size was normal.  There was moderate concentric hypertrophy with severe septal hypertrophy (1.7 cm) and associated SAM of the mitral valve, suggesting HOCM. The outflow tract gradient, however, is minimal at 5-8 mmHg at rest (valsalva not performed). Systolic function was normal. The estimated ejection fraction was in the range of 60% to 65%. Doppler parameters are consistent with abnormal left ventricular relaxation (grade 1 diastolic dysfunction). The E/e' ratio is >10, suggesting elevated LV filling pressure. - Mitral valve: Mildly thickened leaflets- SAM is noted. Mild regurgitation. - Left atrium: LA Volume/ BSA = 20.6 ml/m2 The atrium was normal in size. - Systemic veins: The IVC is small (<1.2 cm) and collapses spontaneously, suggesting volume depletion and a low RA pressure of 2 mmHg. Impressions:  - Dypnea could be related to low preload in the setting of dynamic LVOT obstruction - clinical correlation is advised.   Myoview 08/10/2013 Impression Exercise Capacity:  Lexiscan with no exercise. BP Response:  Normal blood pressure response. Clinical Symptoms:  No symptoms. ECG Impression:  No significant ECG changes with Lexiscan. Comparison with Prior Nuclear Study: No previous nuclear study performed   Overall Impression:  Normal stress nuclear study.   ASSESSMENT:    1. Leg swelling   2. Essential hypertension   3. Hyperlipidemia   4. Tobacco abuse   5. PAF (paroxysmal atrial fibrillation) (HCC)      PLAN:  In order of problems listed above:  1. Bilateral LE edema - Not sure if this is cardiac in nature, however she does have 2-3+ pitting edema bilaterally. Fortunately it appears her lung is clear. She does not have any PND or orthopnea episodes. I think majority is a fluid accumulation occurred in her leg. I wonder if this could be related to her recent anemia, I will obtain a CBC to recheck for worsening anemia. I will also check a TSH and  basic metabolic panel. We have discussed different treatment options, we eventually opted for 20 mg daily of Lasix. She will need to have a recheck basic metabolic panel in one week. I will also obtain outpatient echocardiogram to reassess her ejection fraction.  2. Anemia: She denies any bleeding issues. Her previous hemoglobin was 10, her most recent hemoglobin was 8.0 on 05/28/2016. Recheck CBC to make sure there is no worsening anemia.  PAF:   - This  patients CHA2DS2-VASc Score and unadjusted Ischemic Stroke Rate (% per year) is equal to 4.8 % stroke rate/year from a score of 4 Above score calculated as 1 point each if present [CHF, HTN, DM, Vascular=MI/PAD/Aortic Plaque, Age if 65-74, or Female] Above score calculated as 2 points each if present [Age > 75, or Stroke/TIA/TE]  - She is in sinus rhythm. She is only on aspirin, as she is likely not a good candidate for systemic anticoagulation based on her advanced age and anemia.  3. HTN: Her blood pressure is 156/58, looking back, her blood pressure has always been elevated, however never this high. Her record shows she is taking 50 mg Toprol-XL daily, however according to the patient and her son, she is actually taking 50 mg in a.m. and 25 mg in p.m. Toprol-XL. Given persistent high blood pressure, I will increase the current dose to 50 mg twice a day.  4. HLD: She is on Lipitor 40 mg every other day.  5. Tobacco abuse: She continued to smoke and does not seems to be interested to stop smoking.   Medication Adjustments/Labs and Tests Ordered: Current medicines are reviewed at length with the patient today.  Concerns regarding medicines are outlined above.  Medication changes, Labs and Tests ordered today are listed in the Patient Instructions below. Patient Instructions  Increase Metoprolol to 50 mg twice a day   Start Lasix 20 mg daily    Lab work today ( cbc,bmet,tsh )  Lab work in 1 week Friday 07/05/16   Schedule Echo   Your  physician recommends that you schedule a follow-up appointment in: 2 weeks with Dr.Berry or Almyra Deforest PA if schedule appointment with PA schedule on a day when Dr.Berry in office.     Hilbert Corrigan, [place]  06/28/2016 10:28 PM    [address], [place], [place]  [address] Phone: [phone]; Fax: [phone]

## 2016-07-01 ENCOUNTER — Observation Stay (HOSPITAL_COMMUNITY)
Admission: EM | Admit: 2016-07-01 | Discharge: 2016-07-04 | Disposition: A | Discharge status: Skilled Nursing Facility | Payer: Medicare Other | Attending: Internal Medicine | Admitting: Internal Medicine

## 2016-07-01 ENCOUNTER — Telehealth: Payer: Self-pay

## 2016-07-01 ENCOUNTER — Encounter (HOSPITAL_COMMUNITY): Payer: Self-pay | Admitting: Emergency Medicine

## 2016-07-01 DIAGNOSIS — Z9981 Dependence on supplemental oxygen: Secondary | ICD-10-CM | POA: Diagnosis not present

## 2016-07-01 DIAGNOSIS — F17210 Nicotine dependence, cigarettes, uncomplicated: Secondary | ICD-10-CM | POA: Insufficient documentation

## 2016-07-01 DIAGNOSIS — I480 Paroxysmal atrial fibrillation: Secondary | ICD-10-CM

## 2016-07-01 DIAGNOSIS — I110 Hypertensive heart disease with heart failure: Secondary | ICD-10-CM | POA: Insufficient documentation

## 2016-07-01 DIAGNOSIS — N390 Urinary tract infection, site not specified: Secondary | ICD-10-CM | POA: Insufficient documentation

## 2016-07-01 DIAGNOSIS — I10 Essential (primary) hypertension: Secondary | ICD-10-CM | POA: Diagnosis not present

## 2016-07-01 DIAGNOSIS — H3530 Unspecified macular degeneration: Secondary | ICD-10-CM | POA: Diagnosis present

## 2016-07-01 DIAGNOSIS — D649 Anemia, unspecified: Secondary | ICD-10-CM | POA: Diagnosis not present

## 2016-07-01 DIAGNOSIS — Z7982 Long term (current) use of aspirin: Secondary | ICD-10-CM | POA: Insufficient documentation

## 2016-07-01 DIAGNOSIS — Z7951 Long term (current) use of inhaled steroids: Secondary | ICD-10-CM | POA: Insufficient documentation

## 2016-07-01 DIAGNOSIS — Z79899 Other long term (current) drug therapy: Secondary | ICD-10-CM | POA: Diagnosis not present

## 2016-07-01 DIAGNOSIS — R531 Weakness: Secondary | ICD-10-CM | POA: Diagnosis not present

## 2016-07-01 DIAGNOSIS — I5032 Chronic diastolic (congestive) heart failure: Secondary | ICD-10-CM | POA: Diagnosis present

## 2016-07-01 DIAGNOSIS — E785 Hyperlipidemia, unspecified: Secondary | ICD-10-CM | POA: Diagnosis not present

## 2016-07-01 DIAGNOSIS — D62 Acute posthemorrhagic anemia: Secondary | ICD-10-CM | POA: Diagnosis not present

## 2016-07-01 DIAGNOSIS — K219 Gastro-esophageal reflux disease without esophagitis: Secondary | ICD-10-CM | POA: Diagnosis present

## 2016-07-01 DIAGNOSIS — Z66 Do not resuscitate: Secondary | ICD-10-CM | POA: Diagnosis not present

## 2016-07-01 DIAGNOSIS — J449 Chronic obstructive pulmonary disease, unspecified: Secondary | ICD-10-CM | POA: Diagnosis not present

## 2016-07-01 DIAGNOSIS — J410 Simple chronic bronchitis: Secondary | ICD-10-CM | POA: Diagnosis not present

## 2016-07-01 DIAGNOSIS — I447 Left bundle-branch block, unspecified: Secondary | ICD-10-CM | POA: Diagnosis not present

## 2016-07-01 DIAGNOSIS — R42 Dizziness and giddiness: Secondary | ICD-10-CM | POA: Diagnosis not present

## 2016-07-01 LAB — CBC WITH DIFFERENTIAL/PLATELET
Basophils Absolute: 0 10*3/uL (ref 0.0–0.1)
Basophils Relative: 0 %
EOS ABS: 0.6 10*3/uL (ref 0.0–0.7)
Eosinophils Relative: 6 %
HEMATOCRIT: 27 % — AB (ref 36.0–46.0)
HEMOGLOBIN: 7.2 g/dL — AB (ref 12.0–15.0)
LYMPHS ABS: 2.2 10*3/uL (ref 0.7–4.0)
Lymphocytes Relative: 22 %
MCH: 20.3 pg — AB (ref 26.0–34.0)
MCHC: 26.7 g/dL — AB (ref 30.0–36.0)
MCV: 76.1 fL — ABNORMAL LOW (ref 78.0–100.0)
MONO ABS: 1.3 10*3/uL — AB (ref 0.1–1.0)
MONOS PCT: 13 %
NEUTROS ABS: 6 10*3/uL (ref 1.7–7.7)
NEUTROS PCT: 59 %
Platelets: 385 10*3/uL (ref 150–400)
RBC: 3.55 MIL/uL — ABNORMAL LOW (ref 3.87–5.11)
RDW: 17.3 % — AB (ref 11.5–15.5)
WBC: 10.1 10*3/uL (ref 4.0–10.5)

## 2016-07-01 LAB — I-STAT CHEM 8, ED
BUN: 20 mg/dL (ref 6–20)
CALCIUM ION: 1.16 mmol/L (ref 1.15–1.40)
CREATININE: 0.6 mg/dL (ref 0.44–1.00)
Chloride: 99 mmol/L — ABNORMAL LOW (ref 101–111)
Glucose, Bld: 92 mg/dL (ref 65–99)
HCT: 29 % — ABNORMAL LOW (ref 36.0–46.0)
HEMOGLOBIN: 9.9 g/dL — AB (ref 12.0–15.0)
Potassium: 4.1 mmol/L (ref 3.5–5.1)
Sodium: 145 mmol/L (ref 135–145)
TCO2: 32 mmol/L (ref 0–100)

## 2016-07-01 LAB — FOLATE: FOLATE: 12.9 ng/mL (ref >5.9)

## 2016-07-01 LAB — IRON AND TIBC
IRON: 19 ug/dL — AB (ref 28–170)
Saturation Ratios: 4 % — ABNORMAL LOW (ref 10.4–31.8)
TIBC: 438 ug/dL (ref 250–450)
UIBC: 419 ug/dL

## 2016-07-01 LAB — RETICULOCYTES
RBC.: 3.52 MIL/uL — ABNORMAL LOW (ref 3.87–5.11)
RETIC CT PCT: 1.6 % (ref 0.4–3.1)
Retic Count, Absolute: 56.3 10*3/uL (ref 19.0–186.0)

## 2016-07-01 LAB — POC OCCULT BLOOD, ED: Fecal Occult Bld: NEGATIVE

## 2016-07-01 LAB — PREPARE RBC (CROSSMATCH)

## 2016-07-01 LAB — VITAMIN B12: Vitamin B-12: 331 pg/mL (ref 180–914)

## 2016-07-01 MED ORDER — IPRATROPIUM BROMIDE 0.02 % IN SOLN
RESPIRATORY_TRACT | Status: AC
  Filled 2016-07-01: qty 2.5

## 2016-07-01 MED ORDER — METOPROLOL SUCCINATE ER 50 MG PO TB24
50.0000 mg | ORAL_TABLET | Freq: Every day | ORAL | Status: DC
  Administered 2016-07-02 – 2016-07-04 (×3): 50 mg via ORAL
  Filled 2016-07-01 (×3): qty 1

## 2016-07-01 MED ORDER — ATORVASTATIN CALCIUM 40 MG PO TABS
40.0000 mg | ORAL_TABLET | ORAL | Status: DC
  Administered 2016-07-03: 40 mg via ORAL
  Filled 2016-07-01: qty 1

## 2016-07-01 MED ORDER — ONDANSETRON HCL 4 MG PO TABS: 4.0000 mg | ORAL_TABLET | Freq: Four times a day (QID) | ORAL | Status: DC | PRN

## 2016-07-01 MED ORDER — SODIUM CHLORIDE 0.9 % IV SOLN
Freq: Once | INTRAVENOUS | Status: AC
  Administered 2016-07-01: 18:00:00 via INTRAVENOUS

## 2016-07-01 MED ORDER — VERAPAMIL HCL ER 240 MG PO TBCR
240.0000 mg | EXTENDED_RELEASE_TABLET | ORAL | Status: DC
  Administered 2016-07-02 – 2016-07-04 (×3): 240 mg via ORAL
  Filled 2016-07-01 (×3): qty 1

## 2016-07-01 MED ORDER — IPRATROPIUM BROMIDE 0.02 % IN SOLN
0.5000 mg | Freq: Four times a day (QID) | RESPIRATORY_TRACT | Status: DC
  Administered 2016-07-01 (×2): 0.5 mg via RESPIRATORY_TRACT
  Filled 2016-07-01: qty 2.5

## 2016-07-01 MED ORDER — FUROSEMIDE 20 MG PO TABS: 20.0000 mg | ORAL_TABLET | Freq: Every day | ORAL | 3 refills | Status: DC

## 2016-07-01 MED ORDER — BUDESONIDE 0.5 MG/2ML IN SUSP
0.5000 mg | Freq: Two times a day (BID) | RESPIRATORY_TRACT | Status: DC
  Administered 2016-07-01 – 2016-07-02 (×2): 0.5 mg via RESPIRATORY_TRACT
  Filled 2016-07-01 (×2): qty 2

## 2016-07-01 MED ORDER — SODIUM CHLORIDE 0.9% FLUSH
3.0000 mL | Freq: Two times a day (BID) | INTRAVENOUS | Status: DC
  Administered 2016-07-01 – 2016-07-04 (×5): 3 mL via INTRAVENOUS

## 2016-07-01 MED ORDER — ACETAMINOPHEN 650 MG RE SUPP: 650.0000 mg | Freq: Four times a day (QID) | RECTAL | Status: DC | PRN

## 2016-07-01 MED ORDER — MECLIZINE HCL 25 MG PO TABS
12.5000 mg | ORAL_TABLET | Freq: Three times a day (TID) | ORAL | Status: DC
  Administered 2016-07-01 – 2016-07-02 (×4): 12.5 mg via ORAL
  Filled 2016-07-01 (×4): qty 1

## 2016-07-01 MED ORDER — DIAZEPAM 5 MG PO TABS
5.0000 mg | ORAL_TABLET | Freq: Every day | ORAL | Status: DC | PRN
  Administered 2016-07-02 – 2016-07-04 (×3): 5 mg via ORAL
  Filled 2016-07-01 (×3): qty 1

## 2016-07-01 MED ORDER — OMEGA-3 FATTY ACIDS 1000 MG PO CAPS
1.0000 g | ORAL_CAPSULE | Freq: Two times a day (BID) | ORAL | Status: DC
  Filled 2016-07-01: qty 1

## 2016-07-01 MED ORDER — ONDANSETRON HCL 4 MG/2ML IJ SOLN: 4.0000 mg | Freq: Four times a day (QID) | INTRAMUSCULAR | Status: DC | PRN

## 2016-07-01 MED ORDER — OMEGA-3-ACID ETHYL ESTERS 1 G PO CAPS
1.0000 g | ORAL_CAPSULE | Freq: Two times a day (BID) | ORAL | Status: DC
  Administered 2016-07-01 – 2016-07-04 (×6): 1 g via ORAL
  Filled 2016-07-01 (×6): qty 1

## 2016-07-01 MED ORDER — FERROUS SULFATE 325 (65 FE) MG PO TABS
325.0000 mg | ORAL_TABLET | Freq: Every day | ORAL | Status: DC
  Administered 2016-07-02 – 2016-07-04 (×3): 325 mg via ORAL
  Filled 2016-07-01 (×3): qty 1

## 2016-07-01 MED ORDER — FUROSEMIDE 20 MG PO TABS
20.0000 mg | ORAL_TABLET | Freq: Every day | ORAL | Status: DC
  Administered 2016-07-02 – 2016-07-04 (×3): 20 mg via ORAL
  Filled 2016-07-01 (×3): qty 1

## 2016-07-01 MED ORDER — IPRATROPIUM-ALBUTEROL 0.5-2.5 (3) MG/3ML IN SOLN
3.0000 mL | Freq: Four times a day (QID) | RESPIRATORY_TRACT | Status: DC
  Administered 2016-07-02 (×2): 3 mL via RESPIRATORY_TRACT
  Filled 2016-07-01 (×2): qty 3

## 2016-07-01 MED ORDER — ACETAMINOPHEN 325 MG PO TABS
650.0000 mg | ORAL_TABLET | Freq: Four times a day (QID) | ORAL | Status: DC | PRN
  Administered 2016-07-02: 650 mg via ORAL
  Filled 2016-07-01: qty 2

## 2016-07-01 MED ORDER — ALBUTEROL SULFATE (2.5 MG/3ML) 0.083% IN NEBU: 3.0000 mL | INHALATION_SOLUTION | Freq: Four times a day (QID) | RESPIRATORY_TRACT | Status: DC | PRN

## 2016-07-01 NOTE — H&P (Addendum)
History and Physical    [name] [id number] DOB: 1924/01/25 DOA: 07/01/2016  PCP: Merrilee Seashore, MD  Outpatient Specialists: cardiology, Dr. Gwenlyn Found, GI, Dr. Carlean Purl Patient coming from: home   Chief Complaint: was called to come for low blood counts  HPI: [name] is a 82 y.o. female with medical history significant of HTN, HLD, COPD on home oxygen 2 L/m at bedtime, ongoing tobacco abuse, PAF, LBB, comes to the ER as she was called to come due to low blood counts. Patient has no specific complaints, however states that she has been feeling generally weaker the last 1-2 weeks. She denies any chest pain, denies any palpitations. She was seen in cardiology office last Friday, and does when she got her CBC done. Chest found to have a low hemoglobin of 7.0,and was called today and directed to the emergency room. Patient denies any blood in her stool or dark tarry stools. She is a very poor historian, she tells me she has required blood transfusions in the past, but is unable to detail this further. She has no fever or chills. She has no shortness of breath. She denies any focal weakness. States that she is not sure she wants to stay in the hospital, however is willing to stay overnight for blood transfusions.  ED Course: In the ED, vital signs are stable, she was found to have a hemoglobin of 7.2, and a fecal occult was negative. She was ordered a unit of packed red blood cells and TRH asked for admission for symptomatic anemia  Review of Systems: As per HPI otherwise 10 point review of systems negative.   Past Medical History:  Diagnosis Date  . Cataract   . Chest pain   . COPD (chronic obstructive pulmonary disease) ([place])   . Debility 01/21/2016  . Hearing loss 01/21/2016  . Hyperglycemia 01/21/2016  . Hyperlipidemia   . Hypertension   . Left bundle branch block   . Macular degeneration 01/21/2016  . Major depression ([place]) 01/29/2016  . Memory loss 01/21/2016  . Paroxysmal atrial  fibrillation (HCC)   . Personal history of subdural hematoma   . Subdural hematoma ([place]) 2009  . Tobacco abuse     Past Surgical History:  Procedure Laterality Date  . ABDOMINAL HYSTERECTOMY    . APPENDECTOMY    . BRAIN SURGERY    . EYE SURGERY       reports that she has been smoking Cigarettes.  She started smoking about 70 years ago. She has a 73.00 pack-year smoking history. She has never used smokeless tobacco. She reports that she does not drink alcohol or use drugs.  Allergies  Allergen Reactions  . Cefuroxime Nausea And Vomiting    Family History  Problem Relation Age of Onset  . Diabetes Mother   . Heart disease Mother   . Heart disease Father     Prior to Admission medications   Medication Sig Start Date End Date Taking? Authorizing Provider  albuterol (PROVENTIL HFA;VENTOLIN HFA) 108 (90 BASE) MCG/ACT inhaler Inhale 2 puffs into the lungs every 6 (six) hours as needed for wheezing or shortness of breath.     Historical Provider, MD  aspirin 325 MG tablet Take 325 mg by mouth daily.    Historical Provider, MD  atorvastatin (LIPITOR) 40 MG tablet Take 40 mg by mouth every other day. Every other day    Historical Provider, MD  budesonide (PULMICORT) 0.5 MG/2ML nebulizer solution Take 0.5 mg by nebulization 2 (two) times daily.  Historical Provider, MD  diazepam (VALIUM) 5 MG tablet TAKE 1 TABLET BY MOUTH EVERY DAY AS NEEDED FOR ANXIETY 02/19/16   Tiffany L Reed, DO  ferrous sulfate 325 (65 FE) MG tablet Take 325 mg by mouth daily with breakfast.    Historical Provider, MD  fish oil-omega-3 fatty acids 1000 MG capsule Take 1 g by mouth 2 (two) times daily.     Historical Provider, MD  furosemide (LASIX) 20 MG tablet Take 1 tablet (20 mg total) by mouth daily. 07/01/16 09/29/16  Almyra Deforest, PA  ipratropium (ATROVENT) 0.02 % nebulizer solution Take 1.25 mLs (250 mcg total) by nebulization 4 (four) times daily. 04/06/12 06/28/16  Roselee Culver, MD  ipratropium (ATROVENT) 0.02  % nebulizer solution Take 0.5 mg by nebulization 4 (four) times daily.    Historical Provider, MD  meclizine (ANTIVERT) 12.5 MG tablet Take 1 tablet (12.5 mg total) by mouth 3 (three) times daily. 07/27/15   Carmin Muskrat, MD  metoprolol succinate (TOPROL-XL) 50 MG 24 hr tablet Take 1 tablet (50 mg total) by mouth daily. Take with or immediately following a meal. 01/18/16   Charlynne Cousins, MD  OXYGEN Inhale 2 L into the lungs at bedtime. Reported on 12/04/2015    Historical Provider, MD  verapamil (COVERA HS) 240 MG (CO) 24 hr tablet Take 240 mg by mouth daily.     Historical Provider, MD    Physical Exam: Vitals:   07/01/16 1216 07/01/16 1231 07/01/16 1431 07/01/16 1432  BP:  (!) 159/52  (!) 124/103  Pulse:  99 62 61  Resp:  15 18 18   TempSrc:  Oral    SpO2: 99% 100% 100% 100%      Constitutional: NAD, pale Vitals:   07/01/16 1216 07/01/16 1231 07/01/16 1431 07/01/16 1432  BP:  (!) 159/52  (!) 124/103  Pulse:  99 62 61  Resp:  15 18 18   TempSrc:  Oral    SpO2: 99% 100% 100% 100%   Eyes: PERRL, lids and conjunctivae normal ENMT: Mucous membranes are moist. Posterior pharynx clear of any exudate or lesions. Respiratory: clear to auscultation bilaterally, no wheezing, no crackles. Normal respiratory effort. No accessory muscle use.  Cardiovascular: Regular rate and rhythm. No extremity edema. 2+ pedal pulses.  Abdomen: no tenderness, no masses palpated. Bowel sounds positive.  Musculoskeletal: no clubbing / cyanosis. Normal muscle tone.  Skin: no rashes, lesions, ulcers. No induration Neurologic: non focal  Psychiatric: Normal judgment and insight. Alert and oriented x 3. Normal mood.   Labs on Admission: I have personally reviewed following labs and imaging studies  CBC:  Recent Labs Lab 06/28/16 1154 07/01/16 1230 07/01/16 1253  WBC 9.2 10.1  --   NEUTROABS 5,704 6.0  --   HGB 7.0* 7.2* 9.9*  HCT 25.8* 27.0* 29.0*  MCV 74.1* 76.1*  --   PLT 415* 385  --     Basic Metabolic Panel:  Recent Labs Lab 06/28/16 1154 07/01/16 1253  NA 142 145  K 4.3 4.1  CL 104 99*  CO2 31  --   GLUCOSE 86 92  BUN 19 20  CREATININE 0.57* 0.60  CALCIUM 9.1  --    GFR: Estimated Creatinine Clearance: 47.4 mL/min (by C-G formula based on SCr of 0.8 mg/dL). Liver Function Tests: No results for input(s): AST, ALT, ALKPHOS, BILITOT, PROT, ALBUMIN in the last 168 hours. No results for input(s): LIPASE, AMYLASE in the last 168 hours. No results for input(s): AMMONIA in the last  168 hours. Coagulation Profile: No results for input(s): INR, PROTIME in the last 168 hours. Cardiac Enzymes: No results for input(s): CKTOTAL, CKMB, CKMBINDEX, TROPONINI in the last 168 hours. BNP (last 3 results) No results for input(s): PROBNP in the last 8760 hours. HbA1C: No results for input(s): HGBA1C in the last 72 hours. CBG: No results for input(s): GLUCAP in the last 168 hours. Lipid Profile: No results for input(s): CHOL, HDL, LDLCALC, TRIG, CHOLHDL, LDLDIRECT in the last 72 hours. Thyroid Function Tests: No results for input(s): TSH, T4TOTAL, FREET4, T3FREE, THYROIDAB in the last 72 hours. Anemia Panel: No results for input(s): VITAMINB12, FOLATE, FERRITIN, TIBC, IRON, RETICCTPCT in the last 72 hours. Urine analysis:    Component Value Date/Time   COLORURINE YELLOW 01/14/2016 1713   APPEARANCEUR CLOUDY (A) 01/14/2016 1713   LABSPEC 1.020 01/14/2016 1713   PHURINE 6.0 01/14/2016 1713   GLUCOSEU NEGATIVE 01/14/2016 1713   HGBUR NEGATIVE 01/14/2016 1713   BILIRUBINUR negative 05/28/2016 1526   KETONESUR negative 05/28/2016 [place] 01/14/2016 1713   PROTEINUR negative 05/28/2016 1526   PROTEINUR 30 (A) 01/14/2016 1713   UROBILINOGEN 0.2 05/28/2016 1526   UROBILINOGEN 1.0 07/27/2015 1413   NITRITE Negative 05/28/2016 1526   NITRITE POSITIVE (A) 01/14/2016 1713   LEUKOCYTESUR Trace (A) 05/28/2016 1526    Assessment/Plan Active Problems:    COPD (chronic obstructive pulmonary disease) (HCC)   Paroxysmal atrial fibrillation (HCC)   Hyperlipidemia   Essential hypertension   Macular degeneration   Anemia   GERD (gastroesophageal reflux disease)   Symptomatic anemia   Chronic diastolic CHF (congestive heart failure) (HCC)   Symptomatic anemia - Admit patient to the hospital, telemetry, provide one unit of packed red blood cells and repeat CBC in the morning - Fecal occult testing was negative, repeat with her next BM - Will probably benefit from gastroenterology evaluation despite negative fecal occult, but without active bleed this can probably be done as an outpatient - Monitor overnight, if blood count dropped further will ask for gastroenterology evaluation - Continue iron supplementation - Check an anemia panel  COPD - Stable, no wheezing, on nightly oxygen - Resume home medications with Pulmicort, Atrovent  Chronic diastolic CHF - No significant fluid overload, resume home Lasix  Hypertension - Continue metoprolol and verapamil  Hyperlipidemia - On atorvastatin   DVT prophylaxis: SCD  Code Status: DNR  Family Communication: no family bedside Disposition Plan: admit to tele Consults called: none  Admission status: observation    Marzetta Board, MD [facility] Pager [phone]  If 7PM-7AM, please contact night-coverage [email or web address] Password [email or web address]  07/01/2016, 2:41 PM

## 2016-07-01 NOTE — ED Notes (Addendum)
Pt can go up to floor at 15;00.

## 2016-07-01 NOTE — ED Provider Notes (Signed)
[facility] DEPT Provider Note   CSN: [id number] Arrival date & time: 07/01/16  1205     History   Chief Complaint Chief Complaint  Patient presents with  . Abnormal Lab    low Hgb     HPI [name] is a 82 y.o. female.  Patient is a 83 year old female with a history of chest pain, COPD, hypertension, paroxysmal A. fib presenting today because she was called and told her blood counts are low. Patient has been generally tired and slightly dizzy on occasion but denies any chest pain or shortness of breath. Patient followed up with her cardiologist on Friday for routine evaluation and that's when the blood test were ordered which showed low hemoglobin. Patient states the only other time that she had been anemic was when she was on Coumadin and had internal bleeding. Her last blood transfusion was in 2009.   The history is provided by the patient.  Abnormal Lab  Time since result:  Today Result type: hematology   Hematology:    Hematocrit:  Low   Hemoglobin:  Low   Past Medical History:  Diagnosis Date  . Cataract   . Chest pain   . COPD (chronic obstructive pulmonary disease) ([place])   . Debility 01/21/2016  . Hearing loss 01/21/2016  . Hyperglycemia 01/21/2016  . Hyperlipidemia   . Hypertension   . Left bundle branch block   . Macular degeneration 01/21/2016  . Major depression ([place]) 01/29/2016  . Memory loss 01/21/2016  . Paroxysmal atrial fibrillation (HCC)   . Personal history of subdural hematoma   . Subdural hematoma ([place]) 2009  . Tobacco abuse     Patient Active Problem List   Diagnosis Date Noted  . GERD (gastroesophageal reflux disease) 02/12/2016  . Decubitus ulcer of sacral region, stage 2 01/29/2016  . Major depression ([place]) 01/29/2016  . Hyperglycemia 01/21/2016  . Hearing loss 01/21/2016  . Macular degeneration 01/21/2016  . Memory loss 01/21/2016  . Anemia 01/21/2016  . Debility 01/21/2016  . Pressure ulcer 01/15/2016  . CAP (community acquired  pneumonia) 01/14/2016  . COPD exacerbation ([place]) 01/14/2016  . Acute on chronic respiratory failure with hypoxia ([place]) 01/14/2016  . Leukocytosis 01/14/2016  . Community acquired pneumonia 01/14/2016  . Paroxysmal atrial fibrillation ([place]) 07/30/2013  . Tobacco abuse 07/30/2013  . Hyperlipidemia 07/30/2013  . Essential hypertension 07/30/2013  . Family history of heart disease 07/30/2013  . Dyspnea on exertion 07/30/2013  . COPD (chronic obstructive pulmonary disease) ([place]) 04/06/2012    Past Surgical History:  Procedure Laterality Date  . ABDOMINAL HYSTERECTOMY    . APPENDECTOMY    . BRAIN SURGERY    . EYE SURGERY      OB History    No data available       Home Medications    Prior to Admission medications   Medication Sig Start Date End Date Taking? Authorizing Provider  albuterol (PROVENTIL HFA;VENTOLIN HFA) 108 (90 BASE) MCG/ACT inhaler Inhale 2 puffs into the lungs every 6 (six) hours as needed for wheezing or shortness of breath.     Historical Provider, MD  aspirin 325 MG tablet Take 325 mg by mouth daily.    Historical Provider, MD  atorvastatin (LIPITOR) 40 MG tablet Take 40 mg by mouth every other day. Every other day    Historical Provider, MD  budesonide (PULMICORT) 0.5 MG/2ML nebulizer solution Take 0.5 mg by nebulization 2 (two) times daily.    Historical Provider, MD  diazepam (VALIUM) 5  MG tablet TAKE 1 TABLET BY MOUTH EVERY DAY AS NEEDED FOR ANXIETY 02/19/16   Tiffany L Reed, DO  ferrous sulfate 325 (65 FE) MG tablet Take 325 mg by mouth daily with breakfast.    Historical Provider, MD  fish oil-omega-3 fatty acids 1000 MG capsule Take 1 g by mouth 2 (two) times daily.     Historical Provider, MD  furosemide (LASIX) 20 MG tablet Take 1 tablet (20 mg total) by mouth daily. 07/01/16 09/29/16  Almyra Deforest, PA  ipratropium (ATROVENT) 0.02 % nebulizer solution Take 1.25 mLs (250 mcg total) by nebulization 4 (four) times daily. 04/06/12 06/28/16  Roselee Culver, MD    ipratropium (ATROVENT) 0.02 % nebulizer solution Take 0.5 mg by nebulization 4 (four) times daily.    Historical Provider, MD  meclizine (ANTIVERT) 12.5 MG tablet Take 1 tablet (12.5 mg total) by mouth 3 (three) times daily. 07/27/15   Carmin Muskrat, MD  metoprolol succinate (TOPROL-XL) 50 MG 24 hr tablet Take 1 tablet (50 mg total) by mouth daily. Take with or immediately following a meal. 01/18/16   Charlynne Cousins, MD  OXYGEN Inhale 2 L into the lungs at bedtime. Reported on 12/04/2015    Historical Provider, MD  verapamil (COVERA HS) 240 MG (CO) 24 hr tablet Take 240 mg by mouth daily.     Historical Provider, MD    Family History Family History  Problem Relation Age of Onset  . Diabetes Mother   . Heart disease Mother   . Heart disease Father     Social History Social History  Substance Use Topics  . Smoking status: Current Every Day Smoker    Packs/day: 1.00    Years: 73.00    Types: Cigarettes    Start date: 07/30/1945  . Smokeless tobacco: Never Used  . Alcohol use No     Allergies   Cefuroxime   Review of Systems Review of Systems  All other systems reviewed and are negative.    Physical Exam Updated Vital Signs BP (!) 159/52 (BP Location: Left Arm)   Pulse 99   Resp 15   SpO2 100%   Physical Exam  Constitutional: She is oriented to person, place, and time. She appears well-developed and well-nourished. No distress.  HENT:  Head: Normocephalic and atraumatic.  Mouth/Throat: Oropharynx is clear and moist.  Eyes: EOM are normal. Pupils are equal, round, and reactive to light.  Pale conjunctiva  Neck: Normal range of motion. Neck supple.  Cardiovascular: Normal rate, regular rhythm and intact distal pulses.   Murmur heard. Pulmonary/Chest: Effort normal and breath sounds normal. No respiratory distress. She has no wheezes. She has no rales.  Abdominal: Soft. She exhibits no distension. There is no tenderness. There is no rebound and no guarding.   Genitourinary: Rectal exam shows guaiac negative stool.  Musculoskeletal: Normal range of motion. She exhibits edema. She exhibits no tenderness.  1+ pitting edema bilaterally and mild periorbital edema  Neurological: She is alert and oriented to person, place, and time.  Skin: Skin is warm and dry. No rash noted. No erythema. There is pallor.  Psychiatric: She has a normal mood and affect. Her behavior is normal.  Nursing note and vitals reviewed.    ED Treatments / Results  Labs (all labs ordered are listed, but only abnormal results are displayed) Labs Reviewed  CBC WITH DIFFERENTIAL/PLATELET - Abnormal; Notable for the following:       Result Value   RBC 3.55 (*)  Hemoglobin 7.2 (*)    HCT 27.0 (*)    MCV 76.1 (*)    MCH 20.3 (*)    MCHC 26.7 (*)    RDW 17.3 (*)    Monocytes Absolute 1.3 (*)    All other components within normal limits  I-STAT CHEM 8, ED - Abnormal; Notable for the following:    Chloride 99 (*)    Hemoglobin 9.9 (*)    HCT 29.0 (*)    All other components within normal limits  POC OCCULT BLOOD, ED  TYPE AND SCREEN  PREPARE RBC (CROSSMATCH)    EKG  EKG Interpretation None       Radiology No results found.  Procedures Procedures (including critical care time)  Medications Ordered in ED Medications  0.9 %  sodium chloride infusion (not administered)     Initial Impression / Assessment and Plan / ED Course  I have reviewed the triage vital signs and the nursing notes.  Pertinent labs & imaging results that were available during my care of the patient were reviewed by me and considered in my medical decision making (see chart for details).  Clinical Course   Patient is a 82 year old female with a significant cardiac history who is no longer anticoagulated but on aspirin who is presenting today because she was called and told her hemoglobin was low from blood drawn Friday. Patient has noted edema in her lower extremities which is why she  saw the cardiologist but was noted to have an echo with 60-65% EF and valvular stenosis. Patient denies any abdominal pain, chest pain, shortness of breath but has been generally tired and occasionally dizzy. She denies taking any over-the-counter NSAIDs.  No prior history of GI bleeding. The only other time she was anemic was when she was bleeding on Coumadin. Last blood transfusion was 2009. Hemoglobin was 7 today from 8 last month but prior to that she had hemoglobins around 12. Renal function without acute change. Patient given blood transfusion and admitted for further care.  CRITICAL CARE Performed by: Blanchie Dessert Total critical care time: 30 minutes Critical care time was exclusive of separately billable procedures and treating other patients. Critical care was necessary to treat or prevent imminent or life-threatening deterioration. Critical care was time spent personally by me on the following activities: development of treatment plan with patient and/or surrogate as well as nursing, discussions with consultants, evaluation of patient's response to treatment, examination of patient, obtaining history from patient or surrogate, ordering and performing treatments and interventions, ordering and review of laboratory studies, ordering and review of radiographic studies, pulse oximetry and re-evaluation of patient's condition.   Final Clinical Impressions(s) / ED Diagnoses   Final diagnoses:  Symptomatic anemia    New Prescriptions New Prescriptions   No medications on file     Blanchie Dessert, MD 07/01/16 1419

## 2016-07-01 NOTE — Telephone Encounter (Signed)
I have discussed with patient's son, discussed the need for ED eval and possible transfusion. He display clear understanding. Not sure why the lasix was not send in, I did apologize to her son, we will send in today.

## 2016-07-01 NOTE — ED Triage Notes (Signed)
Per PTAR , pt was told to go to ER for low hemoglobin per her PCP. Unknown value. Per PTAR , pt is not a good historian. Alert and oriented x 4. Denies pain nor other symptoms.

## 2016-07-01 NOTE — Telephone Encounter (Signed)
Spoke with Son Eulah Citizen, refused to take mother to ED. Rx for lasix sent over to pharmacy, Patients son notified.

## 2016-07-01 NOTE — Telephone Encounter (Signed)
-----   Message from [place], [place] sent at 07/01/2016  8:45 AM EDT ----- Thyroid normal, kidney and electrolyte stable. Her anemia is getting worse, current hemoglobin is 7.0 down from 8.0 a month ago which is near transfusion level, she will likely require blood transfusion which cannot be done in the clinic, advise her to be evaluated in the ED.

## 2016-07-02 DIAGNOSIS — I5032 Chronic diastolic (congestive) heart failure: Secondary | ICD-10-CM

## 2016-07-02 DIAGNOSIS — E785 Hyperlipidemia, unspecified: Secondary | ICD-10-CM | POA: Diagnosis not present

## 2016-07-02 DIAGNOSIS — J449 Chronic obstructive pulmonary disease, unspecified: Secondary | ICD-10-CM

## 2016-07-02 DIAGNOSIS — K210 Gastro-esophageal reflux disease with esophagitis: Secondary | ICD-10-CM | POA: Diagnosis not present

## 2016-07-02 DIAGNOSIS — D649 Anemia, unspecified: Secondary | ICD-10-CM | POA: Diagnosis not present

## 2016-07-02 DIAGNOSIS — I10 Essential (primary) hypertension: Secondary | ICD-10-CM

## 2016-07-02 DIAGNOSIS — D62 Acute posthemorrhagic anemia: Secondary | ICD-10-CM

## 2016-07-02 DIAGNOSIS — H8110 Benign paroxysmal vertigo, unspecified ear: Secondary | ICD-10-CM | POA: Diagnosis not present

## 2016-07-02 LAB — URINE MICROSCOPIC-ADD ON

## 2016-07-02 LAB — URINALYSIS, ROUTINE W REFLEX MICROSCOPIC
BILIRUBIN URINE: NEGATIVE
GLUCOSE, UA: NEGATIVE mg/dL
HGB URINE DIPSTICK: NEGATIVE
KETONES UR: NEGATIVE mg/dL
Nitrite: NEGATIVE
PH: 6.5 (ref 5.0–8.0)
Protein, ur: NEGATIVE mg/dL
SPECIFIC GRAVITY, URINE: 1.014 (ref 1.005–1.030)

## 2016-07-02 LAB — BASIC METABOLIC PANEL
Anion gap: 5 (ref 5–15)
BUN: 16 mg/dL (ref 6–20)
CALCIUM: 8.8 mg/dL — AB (ref 8.9–10.3)
CHLORIDE: 103 mmol/L (ref 101–111)
CO2: 34 mmol/L — AB (ref 22–32)
CREATININE: 0.57 mg/dL (ref 0.44–1.00)
GFR calc non Af Amer: >60 mL/min (ref >60)
Glucose, Bld: 94 mg/dL (ref 65–99)
Potassium: 4.1 mmol/L (ref 3.5–5.1)
SODIUM: 142 mmol/L (ref 135–145)

## 2016-07-02 LAB — CBC
HCT: 26.9 % — ABNORMAL LOW (ref 36.0–46.0)
Hemoglobin: 7.4 g/dL — ABNORMAL LOW (ref 12.0–15.0)
MCH: 20.9 pg — AB (ref 26.0–34.0)
MCHC: 27.5 g/dL — AB (ref 30.0–36.0)
MCV: 76 fL — AB (ref 78.0–100.0)
PLATELETS: 339 10*3/uL (ref 150–400)
RBC: 3.54 MIL/uL — ABNORMAL LOW (ref 3.87–5.11)
RDW: 16.5 % — AB (ref 11.5–15.5)
WBC: 9 10*3/uL (ref 4.0–10.5)

## 2016-07-02 LAB — HEMOGLOBIN AND HEMATOCRIT, BLOOD
HCT: 26.5 % — ABNORMAL LOW (ref 36.0–46.0)
HEMOGLOBIN: 7.4 g/dL — AB (ref 12.0–15.0)

## 2016-07-02 NOTE — Care Management Obs Status (Signed)
[facility] NOTIFICATION   Patient Details  Name: [name] MRN: [id number] Date of Birth: Feb 23, 1924   Medicare Observation Status Notification Given:  Yes    Dellie Catholic, RN 07/02/2016, 2:50 PM

## 2016-07-02 NOTE — Progress Notes (Addendum)
Patient ID: [name], female   DOB: [birth year], 82 y.o.   MRN: [id number]  PROGRESS NOTE    [name]  [id number] DOB: [birth year] DOA: 07/01/2016  PCP: Merrilee Seashore, MD   Brief Narrative:  82 y.o. female with past medical history significant for HTN, HLD, COPD on home oxygen 2 L/m at bedtime, ongoing tobacco abuse who presented to [facility] with generalized weakness over past 1-2 weeks. She was seen in cardio office couple of days PTA and was subsequently called for low hemoglobin.  On admission, pt was stable hemodynamically. Hemoglobin was 9.9 and then 7.4. FOBT in ED was negative. She has received 1 unit of PRBC transfusion so far.  Assessment & Plan:   Active Problems: Generalized weakness / acute blood loss anemia - Unclear etiology. No evidence of gross bleeding. No reports of blood per rectum or hematuria.  - Hemoglobin 9.9 on the admission and repeat level 7.4 - She has received 1 unit of PRBC transfusion so far - Check CBC in the morning - FOBT on admission was negative     COPD (chronic obstructive pulmonary disease) (HCC) - Stable respiratory status - Continue albuterol nebulizer Every 6 hours as needed for shortness of breath or wheezing - D/C Pulmicort nebulizer     Hyperlipidemia - Continue Lipitor 40 mg at bedtime    Essential hypertension - Continue metoprolol 50 mg daily    Chronic diastolic CHF (congestive heart failure) (HCC) - Compensated - Continue lasix 20 mg daily    DVT prophylaxis: SCD's bilaterally  Code Status: DNR/DNI Family Communication: No family at the bedside Disposition Plan: home in am if hemoglobin above 8   Consultants:   None   Procedures:   1 U pRBC transfusion 9/11  Antimicrobials:   None    Subjective: No overnight events.   Objective: Vitals:   07/01/16 2109 07/02/16 0406 07/02/16 0747 07/02/16 1102  BP: (!) 161/46 (!) 142/83    Pulse: 63 64 68 65  Resp: 20 20 15 16   Temp: 98.2 F (36.8 C) 98 F  (36.7 C)    TempSrc: Oral Oral    SpO2: 100% 95% 96% 96%  Weight:      Height:        Intake/Output Summary (Last 24 hours) at 07/02/16 1153 Last data filed at 07/02/16 1144  Gross per 24 hour  Intake              388 ml  Output              200 ml  Net              188 ml   Filed Weights   07/01/16 1555  Weight: 69.9 kg (154 lb 2 oz)    Examination:  General exam: Appears calm and comfortable  Respiratory system: Clear to auscultation. Respiratory effort normal. Cardiovascular system: S1 & S2 heard, RRR. No pedal edema. Gastrointestinal system: Abdomen is nondistended, soft and nontender. No organomegaly or masses felt. Normal bowel sounds heard. Central nervous system: Alert and oriented. No focal neurological deficits. Extremities: Symmetric 5 x 5 power. Skin: No rashes, lesions or ulcers Psychiatry: Judgement and insight appear normal. Mood & affect appropriate.   Data Reviewed: I have personally reviewed following labs and imaging studies  CBC:  Recent Labs Lab 06/28/16 1154 07/01/16 1230 07/01/16 1253 07/02/16 0021 07/02/16 0513  WBC 9.2 10.1  --   --  9.0  NEUTROABS 5,704 6.0  --   --   --  HGB 7.0* 7.2* 9.9* 7.4* 7.4*  HCT 25.8* 27.0* 29.0* 26.5* 26.9*  MCV 74.1* 76.1*  --   --  76.0*  PLT 415* 385  --   --  99991111   Basic Metabolic Panel:  Recent Labs Lab 06/28/16 1154 07/01/16 1253 07/02/16 0513  NA 142 145 142  K 4.3 4.1 4.1  CL 104 99* 103  CO2 31  --  34*  GLUCOSE 86 92 94  BUN 19 20 16   CREATININE 0.57* 0.60 0.57  CALCIUM 9.1  --  8.8*   GFR: Estimated Creatinine Clearance: 45.5 mL/min (by C-G formula based on SCr of 0.8 mg/dL). Liver Function Tests: No results for input(s): AST, ALT, ALKPHOS, BILITOT, PROT, ALBUMIN in the last 168 hours. No results for input(s): LIPASE, AMYLASE in the last 168 hours. No results for input(s): AMMONIA in the last 168 hours. Coagulation Profile: No results for input(s): INR, PROTIME in the last 168  hours. Cardiac Enzymes: No results for input(s): CKTOTAL, CKMB, CKMBINDEX, TROPONINI in the last 168 hours. BNP (last 3 results) No results for input(s): PROBNP in the last 8760 hours. HbA1C: No results for input(s): HGBA1C in the last 72 hours. CBG: No results for input(s): GLUCAP in the last 168 hours. Lipid Profile: No results for input(s): CHOL, HDL, LDLCALC, TRIG, CHOLHDL, LDLDIRECT in the last 72 hours. Thyroid Function Tests: No results for input(s): TSH, T4TOTAL, FREET4, T3FREE, THYROIDAB in the last 72 hours. Anemia Panel:  Recent Labs  07/01/16 1518  VITAMINB12 331  FOLATE 12.9  TIBC 438  IRON 19*  RETICCTPCT 1.6   Urine analysis:    Component Value Date/Time   COLORURINE YELLOW 07/02/2016 1042   APPEARANCEUR CLOUDY (A) 07/02/2016 1042   LABSPEC 1.014 07/02/2016 1042   PHURINE 6.5 07/02/2016 1042   GLUCOSEU NEGATIVE 07/02/2016 1042   HGBUR NEGATIVE 07/02/2016 1042   BILIRUBINUR NEGATIVE 07/02/2016 1042   BILIRUBINUR negative 05/28/2016 1526   KETONESUR NEGATIVE 07/02/2016 1042   PROTEINUR NEGATIVE 07/02/2016 1042   UROBILINOGEN 0.2 05/28/2016 1526   UROBILINOGEN 1.0 07/27/2015 1413   NITRITE NEGATIVE 07/02/2016 1042   LEUKOCYTESUR SMALL (A) 07/02/2016 1042   Sepsis Labs: @LABRCNTIP (procalcitonin:4,lacticidven:4)   )No results found for this or any previous visit (from the past 240 hour(s)).    Radiology Studies: No results found.   Scheduled Meds: . atorvastatin  40 mg Oral QODAY  . budesonide  0.5 mg Nebulization BID  . ferrous sulfate  325 mg Oral Q breakfast  . furosemide  20 mg Oral Daily  . ipratropium-albuterol  3 mL Nebulization QID  . meclizine  12.5 mg Oral TID  . metoprolol succinate  50 mg Oral Daily  . omega-3 acid ethyl esters  1 g Oral BID  . sodium chloride flush  3 mL Intravenous Q12H  . verapamil  240 mg Oral Q24H   Continuous Infusions:    LOS: 0 days    Time spent: 25 minutes Were Greater than 50% of the time spent on  counseling and coordinating the care.   Leisa Lenz, MD [facility] Pager [phone]  If 7PM-7AM, please contact night-coverage [email or web address] Password [facility] 07/02/2016, 11:53 AM

## 2016-07-03 DIAGNOSIS — J449 Chronic obstructive pulmonary disease, unspecified: Secondary | ICD-10-CM | POA: Diagnosis not present

## 2016-07-03 DIAGNOSIS — K210 Gastro-esophageal reflux disease with esophagitis: Secondary | ICD-10-CM

## 2016-07-03 DIAGNOSIS — I5032 Chronic diastolic (congestive) heart failure: Secondary | ICD-10-CM | POA: Diagnosis not present

## 2016-07-03 DIAGNOSIS — D649 Anemia, unspecified: Secondary | ICD-10-CM

## 2016-07-03 DIAGNOSIS — I10 Essential (primary) hypertension: Secondary | ICD-10-CM | POA: Diagnosis not present

## 2016-07-03 DIAGNOSIS — H8110 Benign paroxysmal vertigo, unspecified ear: Secondary | ICD-10-CM | POA: Diagnosis not present

## 2016-07-03 DIAGNOSIS — E785 Hyperlipidemia, unspecified: Secondary | ICD-10-CM | POA: Diagnosis not present

## 2016-07-03 LAB — CBC
HEMATOCRIT: 27.3 % — AB (ref 36.0–46.0)
HEMOGLOBIN: 7.8 g/dL — AB (ref 12.0–15.0)
MCH: 21.3 pg — ABNORMAL LOW (ref 26.0–34.0)
MCHC: 28.6 g/dL — ABNORMAL LOW (ref 30.0–36.0)
MCV: 74.6 fL — AB (ref 78.0–100.0)
PLATELETS: 346 10*3/uL (ref 150–400)
RBC: 3.66 MIL/uL — AB (ref 3.87–5.11)
RDW: 17.2 % — ABNORMAL HIGH (ref 11.5–15.5)
WBC: 8.6 10*3/uL (ref 4.0–10.5)

## 2016-07-03 LAB — PREPARE RBC (CROSSMATCH)

## 2016-07-03 MED ORDER — SODIUM CHLORIDE 0.9 % IV SOLN
Freq: Once | INTRAVENOUS | Status: AC
  Administered 2016-07-03: 15:00:00 via INTRAVENOUS

## 2016-07-03 MED ORDER — MECLIZINE HCL 25 MG PO TABS
25.0000 mg | ORAL_TABLET | Freq: Three times a day (TID) | ORAL | Status: DC
  Administered 2016-07-03 – 2016-07-04 (×4): 25 mg via ORAL
  Filled 2016-07-03 (×4): qty 1

## 2016-07-03 MED ORDER — DEXTROSE 5 % IV SOLN
1.0000 g | Freq: Every day | INTRAVENOUS | Status: DC
  Administered 2016-07-03 – 2016-07-04 (×2): 1 g via INTRAVENOUS
  Filled 2016-07-03 (×2): qty 10

## 2016-07-03 NOTE — Progress Notes (Addendum)
Patient ID: [name], female   DOB: 03-20-1924, 82 y.o.   MRN: [id number]  PROGRESS NOTE    [name]  [id number] DOB: 08/04/1924 DOA: 07/01/2016  PCP: Merrilee Seashore, MD   Brief Narrative:  82 y.o. female with past medical history significant for HTN, HLD, COPD on home oxygen 2 L/m at bedtime, ongoing tobacco abuse who presented to [facility] with generalized weakness over past 1-2 weeks. She was seen in cardio office couple of days PTA and was subsequently called for low hemoglobin.  On admission, pt was stable hemodynamically. Hemoglobin was 9.9 and then 7.4. FOBT in ED was negative. She has received 1 unit of PRBC transfusion so far.  Assessment & Plan:   Active Problems: Generalized weakness / acute blood loss anemia - Unclear etiology. No evidence of gross bleeding. No reports of blood per rectum or hematuria.  - FOBT on admission was negative  - Hemoglobin 9.9 on the admission and repeat level 7.4 - Hemoglobin is 7.8 this morning - She has received 1 unit PRBC so far and will give 1 unit more units today - Continue ferrous sulfate supplementation     COPD (chronic obstructive pulmonary disease) (HCC) - Stable respiratory status - Continue as needed nebulizer treatment     Hyperlipidemia - Continue Lipitor 40 mg at bedtime    Essential hypertension - Continue metoprolol 50 mg daily    Chronic diastolic CHF (congestive heart failure) (HCC) - Compensated - Continue lasix 20 mg daily     Dizziness / Vertigo - Resume meclizine 25 mg every 12 hours as needed  - PT eval pending   DVT prophylaxis: SCD's bilaterally  Code Status: DNR/DNI Family Communication: Her 2 sons at the bedside Disposition Plan: needs PT eval for safe discharge planning    Consultants:   PT  Procedures:   1 U PRBC transfusion 9/11 and 1 U PRBC 9/13  Antimicrobials:   None    Subjective: No overnight events.   Objective: Vitals:   07/02/16 1102 07/02/16 1305 07/02/16 2037  07/03/16 0526  BP:  (!) 164/58 (!) 117/45 (!) 129/55  Pulse: 65 81 60 (!) 57  Resp: 16 17 18 18   Temp:  98.1 F (36.7 C) 99.3 F (37.4 C) 98 F (36.7 C)  TempSrc:  Oral Oral Oral  SpO2: 96% 98% 100% 99%  Weight:      Height:        Intake/Output Summary (Last 24 hours) at 07/03/16 1052 Last data filed at 07/03/16 [id number]  Gross per 24 hour  Intake              360 ml  Output             2350 ml  Net            -1990 ml   Filed Weights   07/01/16 1555  Weight: 69.9 kg (154 lb 2 oz)    Examination:  General exam: Appears calm and comfortable  Respiratory system: Clear to auscultation. Respiratory effort normal. Cardiovascular system: S1 & S2 heard, RRR. No pedal edema. Gastrointestinal system: Abdomen is nondistended, soft and nontender. No organomegaly or masses felt. Normal bowel sounds heard. Central nervous system: Alert and oriented. No focal neurological deficits. Extremities: Symmetric 5 x 5 power. Skin: No rashes, lesions or ulcers Psychiatry: Judgement and insight appear normal. Mood & affect appropriate.   Data Reviewed: I have personally reviewed following labs and imaging studies  CBC:  Recent Labs Lab 06/28/16 1154  07/01/16 1230 07/01/16 1253 07/02/16 0021 07/02/16 0513 07/03/16 0503  WBC 9.2 10.1  --   --  9.0 8.6  NEUTROABS 5,704 6.0  --   --   --   --   HGB 7.0* 7.2* 9.9* 7.4* 7.4* 7.8*  HCT 25.8* 27.0* 29.0* 26.5* 26.9* 27.3*  MCV 74.1* 76.1*  --   --  76.0* 74.6*  PLT 415* 385  --   --  [id number]   Basic Metabolic Panel:  Recent Labs Lab 06/28/16 1154 07/01/16 1253 07/02/16 0513  NA 142 145 142  K 4.3 4.1 4.1  CL 104 99* 103  CO2 31  --  34*  GLUCOSE 86 92 94  BUN 19 20 16   CREATININE 0.57* 0.60 0.57  CALCIUM 9.1  --  8.8*   GFR: Estimated Creatinine Clearance: 45.5 mL/min (by C-G formula based on SCr of 0.57 mg/dL). Liver Function Tests: No results for input(s): AST, ALT, ALKPHOS, BILITOT, PROT, ALBUMIN in the last 168 hours. No  results for input(s): LIPASE, AMYLASE in the last 168 hours. No results for input(s): AMMONIA in the last 168 hours. Coagulation Profile: No results for input(s): INR, PROTIME in the last 168 hours. Cardiac Enzymes: No results for input(s): CKTOTAL, CKMB, CKMBINDEX, TROPONINI in the last 168 hours. BNP (last 3 results) No results for input(s): PROBNP in the last 8760 hours. HbA1C: No results for input(s): HGBA1C in the last 72 hours. CBG: No results for input(s): GLUCAP in the last 168 hours. Lipid Profile: No results for input(s): CHOL, HDL, LDLCALC, TRIG, CHOLHDL, LDLDIRECT in the last 72 hours. Thyroid Function Tests: No results for input(s): TSH, T4TOTAL, FREET4, T3FREE, THYROIDAB in the last 72 hours. Anemia Panel:  Recent Labs  07/01/16 1518  VITAMINB12 331  FOLATE 12.9  TIBC 438  IRON 19*  RETICCTPCT 1.6   Urine analysis:    Component Value Date/Time   COLORURINE YELLOW 07/02/2016 1042   APPEARANCEUR CLOUDY (A) 07/02/2016 1042   LABSPEC 1.014 07/02/2016 1042   PHURINE 6.5 07/02/2016 1042   GLUCOSEU NEGATIVE 07/02/2016 1042   HGBUR NEGATIVE 07/02/2016 1042   BILIRUBINUR NEGATIVE 07/02/2016 1042   BILIRUBINUR negative 05/28/2016 1526   KETONESUR NEGATIVE 07/02/2016 1042   PROTEINUR NEGATIVE 07/02/2016 1042   UROBILINOGEN 0.2 05/28/2016 1526   UROBILINOGEN 1.0 07/27/2015 1413   NITRITE NEGATIVE 07/02/2016 1042   LEUKOCYTESUR SMALL (A) 07/02/2016 1042   Sepsis Labs: @LABRCNTIP (procalcitonin:4,lacticidven:4)   )No results found for this or any previous visit (from the past 240 hour(s)).    Radiology Studies: No results found.   Scheduled Meds: . sodium chloride   Intravenous Once  . atorvastatin  40 mg Oral QODAY  . cefTRIAXone (ROCEPHIN)  IV  1 g Intravenous Daily  . ferrous sulfate  325 mg Oral Q breakfast  . furosemide  20 mg Oral Daily  . meclizine  25 mg Oral TID  . metoprolol succinate  50 mg Oral Daily  . omega-3 acid ethyl esters  1 g Oral BID   . sodium chloride flush  3 mL Intravenous Q12H  . verapamil  240 mg Oral Q24H   Continuous Infusions:    LOS: 0 days    Time spent: 15 minutes  Greater than 50% of the time spent on counseling and coordinating the care.   Leisa Lenz, MD [facility] Pager [phone]  If 7PM-7AM, please contact night-coverage [email or web address] Password [facility] 07/03/2016, 10:52 AM

## 2016-07-03 NOTE — Clinical Social Work Placement (Signed)
Patient has a bed at [facility]. CSW has completed FL2 & will continue to follow and assist with discharge when ready.    Raynaldo Opitz, [facility] Clinical Social Worker cell #: [phone]     CLINICAL SOCIAL WORK PLACEMENT  NOTE  Date:  07/03/2016  Patient Details  Name: CLEMETINE ODENS MRN: [id number] Date of Birth: 16-Aug-1924  Clinical Social Work is seeking post-discharge placement for this patient at the [place] level of care (*CSW will initial, date and re-position this form in  chart as items are completed):  Yes   Patient/family provided with [place] Work Department's list of facilities offering this level of care within the geographic area requested by the patient (or if unable, by the patient's family).  Yes   Patient/family informed of their freedom to choose among providers that offer the needed level of care, that participate in Medicare, Medicaid or managed care program needed by the patient, have an available bed and are willing to accept the patient.  Yes   Patient/family informed of [place]'s ownership interest in [facility] and [facility], as well as of the fact that they are under no obligation to receive care at these facilities.  PASRR submitted to EDS on       PASRR number received on       Existing PASRR number confirmed on 07/03/16     FL2 transmitted to all facilities in geographic area requested by pt/family on 07/03/16     FL2 transmitted to all facilities within larger geographic area on       Patient informed that his/her managed care company has contracts with or will negotiate with certain facilities, including the following:        Yes   Patient/family informed of bed offers received.  Patient chooses bed at [facility]     Physician recommends and patient chooses bed at      Patient to be transferred to [place] on  .  Patient to be transferred to facility by        Patient family notified on   of transfer.  Name of family member notified:        PHYSICIAN       Additional Comment:    _______________________________________________ Standley Brooking, LCSW 07/03/2016, 3:03 PM

## 2016-07-03 NOTE — Evaluation (Signed)
Physical Therapy Evaluation Patient Details Name: [name] MRN: [id number] DOB: Jul 18, 1924 Today's Date: 07/03/2016   History of Present Illness  82 yo female admitted with anemia, weakness, dizziness. Hx of HTN, COPD, PAfib, OA.  Clinical Impression  On eval, pt required Mod assist for mobility. She was able to walk ~10 feet on today. Distance was limited by fatigue, weakness. Pt continues to c/o dizziness especially with head turns, rolling in bed. O2 sats 91% on 2L O2 during activity. Pt somewhat drowsy during this session as well.     Follow Up Recommendations SNF    Equipment Recommendations  None recommended by PT    Recommendations for Other Services OT consult     Precautions / Restrictions Precautions Precautions: Fall Precaution Comments: dizziness. O2 dep Restrictions Weight Bearing Restrictions: No      Mobility  Bed Mobility Overal bed mobility: Needs Assistance Bed Mobility: Supine to Sit     Supine to sit: Min assist;HOB elevated     General bed mobility comments: Assist for trunk. Increased time.   Transfers Overall transfer level: Needs assistance Equipment used: Rolling walker (2 wheeled) Transfers: Sit to/from Stand Sit to Stand: Mod assist;From elevated surface         General transfer comment: Multiple attempts required to get to standing. Increased time. Cues for safety, technique.   Ambulation/Gait Ambulation/Gait assistance: Min assist Ambulation Distance (Feet): 10 Feet Assistive device: Rolling walker (2 wheeled) Gait Pattern/deviations: Step-through pattern;Decreased stride length;Trunk flexed     General Gait Details: Pt was able to walk a short distance today. distance limited by fatigue, weakness.   Stairs            Wheelchair Mobility    Modified Rankin (Stroke Patients Only)       Balance                                             Pertinent Vitals/Pain Pain Assessment: No/denies  pain    Home Living Family/patient expects to be discharged to:: Private residence Living Arrangements: Children Available Help at Discharge: Family Type of Home: House Home Access: Stairs to enter   Technical brewer of Steps: 2 Home Layout: [place]: Environmental consultant - 2 wheels;Bedside commode (lift chair)      Prior Function Level of Independence: Needs assistance   Gait / Transfers Assistance Needed: RW for ambulation.   ADL's / Homemaking Assistance Needed: aide comes every friday to help with bathing        Hand Dominance        Extremity/Trunk Assessment   Upper Extremity Assessment: Generalized weakness           Lower Extremity Assessment: Generalized weakness      Cervical / Trunk Assessment: Kyphotic  Communication   Communication: HOH  Cognition Arousal/Alertness: Awake/alert Behavior During Therapy: WFL for tasks assessed/performed Overall Cognitive Status: Within Functional Limits for tasks assessed                      General Comments      Exercises General Exercises - Lower Extremity Ankle Circles/Pumps: AROM;Both;15 reps;Seated Quad Sets: AROM;Both;15 reps;Seated Long Arc Quad: AROM;Both;15 reps;Seated Hip Flexion/Marching: AROM;15 reps;Seated      Assessment/Plan    PT Assessment Patient needs continued PT services  PT Diagnosis Difficulty walking;Generalized weakness   PT Problem List Decreased  strength;Decreased mobility;Decreased activity tolerance;Decreased balance;Decreased knowledge of use of DME  PT Treatment Interventions DME instruction;Gait training;Functional mobility training;Therapeutic activities;Therapeutic exercise;Balance training;Patient/family education   PT Goals (Current goals can be found in the Care Plan section) Acute Rehab PT Goals Patient Stated Goal: none stated PT Goal Formulation: With patient/family Time For Goal Achievement: 07/17/16 Potential to Achieve Goals: Good     Frequency Min 3X/week   Barriers to discharge        Co-evaluation               End of Session Equipment Utilized During Treatment: Gait belt Activity Tolerance: Patient limited by fatigue Patient left: in chair;with call bell/phone within reach;with chair alarm set;with family/visitor present      Functional Assessment Tool Used: clinical judgement Functional Limitation: Mobility: Walking and moving around Mobility: Walking and Moving Around Current Status [phone]): At least 20 percent but less than 40 percent impaired, limited or restricted Mobility: Walking and Moving Around Goal Status [phone]): At least 1 percent but less than 20 percent impaired, limited or restricted    Time: 1134-1203 PT Time Calculation (min) (ACUTE ONLY): 29 min   Charges:     PT Treatments $Gait Training: 8-22 mins   PT G Codes:   PT G-Codes **NOT FOR INPATIENT CLASS** Functional Assessment Tool Used: clinical judgement Functional Limitation: Mobility: Walking and moving around Mobility: Walking and Moving Around Current Status [phone]): At least 20 percent but less than 40 percent impaired, limited or restricted Mobility: Walking and Moving Around Goal Status [phone]): At least 1 percent but less than 20 percent impaired, limited or restricted    Weston Anna, MPT Pager: [phone]

## 2016-07-03 NOTE — Progress Notes (Signed)
Patient continues to have complaints of urinary frequency and burning overnight.  She also continues to complain of weakness and dizziness when she gets out of bed. States "the room is swimming".  Will continue to monitor patient.

## 2016-07-03 NOTE — NC FL2 (Signed)
MEDICAID FL2 LEVEL OF CARE SCREENING TOOL     IDENTIFICATION  Patient Name: [name] Birthdate: [birth year] Sex: female Admission Date (Current Location): 07/01/2016  [facility] and [place] Number:  Herbalist and Address:  [facility],  [place] [address] [place]      Provider Number: [id number]  Attending Physician Name and Address:  Robbie Lis, MD  Relative Name and Phone Number:       Current Level of Care: Hospital Recommended Level of Care: [place] Prior Approval Number:    Date Approved/Denied:   PASRR Number: [id number]  Discharge Plan: SNF    Current Diagnoses: Patient Active Problem List   Diagnosis Date Noted  . Symptomatic anemia 07/01/2016  . Chronic diastolic CHF (congestive heart failure) ([place]) 07/01/2016  . GERD (gastroesophageal reflux disease) 02/12/2016  . Decubitus ulcer of sacral region, stage 2 01/29/2016  . Major depression ([place]) 01/29/2016  . Hyperglycemia 01/21/2016  . Hearing loss 01/21/2016  . Macular degeneration 01/21/2016  . Memory loss 01/21/2016  . Anemia 01/21/2016  . Debility 01/21/2016  . Pressure ulcer 01/15/2016  . CAP (community acquired pneumonia) 01/14/2016  . COPD exacerbation ([place]) 01/14/2016  . Acute on chronic respiratory failure with hypoxia ([place]) 01/14/2016  . Leukocytosis 01/14/2016  . Community acquired pneumonia 01/14/2016  . Paroxysmal atrial fibrillation ([place]) 07/30/2013  . Tobacco abuse 07/30/2013  . Hyperlipidemia 07/30/2013  . Essential hypertension 07/30/2013  . Family history of heart disease 07/30/2013  . Dyspnea on exertion 07/30/2013  . COPD (chronic obstructive pulmonary disease) ([place]) 04/06/2012    Orientation RESPIRATION BLADDER Height & Weight     Self, Situation, Time, Place  O2 (2.5L) Continent Weight: 154 lb 2 oz (69.9 kg) Height:  5\' 7"  (170.2 cm)  BEHAVIORAL SYMPTOMS/MOOD NEUROLOGICAL BOWEL NUTRITION STATUS      Continent  Diet (Heart)  AMBULATORY STATUS COMMUNICATION OF NEEDS Skin   Extensive Assist Verbally Normal                       Personal Care Assistance Level of Assistance  Bathing, Dressing Bathing Assistance: Limited assistance   Dressing Assistance: Limited assistance     Functional Limitations Info             SPECIAL CARE FACTORS FREQUENCY  PT (By licensed PT), OT (By licensed OT)     PT Frequency: 5 OT Frequency: 5            Contractures      Additional Factors Info  Code Status, Allergies Code Status Info: DNR Allergies Info: Allergies:  Cefuroxime           Current Medications (07/03/2016):  This is the current hospital active medication list Current Facility-Administered Medications  Medication Dose Route Frequency Provider Last Rate Last Dose  . 0.9 %  sodium chloride infusion   Intravenous Once Robbie Lis, MD      . acetaminophen (TYLENOL) tablet 650 mg  650 mg Oral Q6H PRN Caren Griffins, MD   650 mg at 07/02/16 2250   Or  . acetaminophen (TYLENOL) suppository 650 mg  650 mg Rectal Q6H PRN Costin Karlyne Greenspan, MD      . albuterol (PROVENTIL) (2.5 MG/3ML) 0.083% nebulizer solution 3 mL  3 mL Inhalation Q6H PRN Costin Karlyne Greenspan, MD      . atorvastatin (LIPITOR) tablet 40 mg  40 mg Oral QODAY Costin Karlyne Greenspan, MD   40  mg at 07/03/16 1043  . cefTRIAXone (ROCEPHIN) 1 g in dextrose 5 % 50 mL IVPB  1 g Intravenous Daily Robbie Lis, MD   1 g at 07/03/16 1205  . diazepam (VALIUM) tablet 5 mg  5 mg Oral Daily PRN Caren Griffins, MD   5 mg at 07/03/16 1043  . ferrous sulfate tablet 325 mg  325 mg Oral Q breakfast Caren Griffins, MD   325 mg at 07/03/16 0814  . furosemide (LASIX) tablet 20 mg  20 mg Oral Daily Costin Karlyne Greenspan, MD   20 mg at 07/03/16 1044  . meclizine (ANTIVERT) tablet 25 mg  25 mg Oral TID Robbie Lis, MD   25 mg at 07/03/16 1043  . metoprolol succinate (TOPROL-XL) 24 hr tablet 50 mg  50 mg Oral Daily Costin Karlyne Greenspan, MD   50 mg at  07/03/16 1044  . omega-3 acid ethyl esters (LOVAZA) capsule 1 g  1 g Oral BID Caren Griffins, MD   1 g at 07/03/16 1044  . ondansetron (ZOFRAN) tablet 4 mg  4 mg Oral Q6H PRN Costin Karlyne Greenspan, MD       Or  . ondansetron (ZOFRAN) injection 4 mg  4 mg Intravenous Q6H PRN Costin Karlyne Greenspan, MD      . sodium chloride flush (NS) 0.9 % injection 3 mL  3 mL Intravenous Q12H Costin Karlyne Greenspan, MD   3 mL at 07/02/16 2200  . verapamil (CALAN-SR) CR tablet 240 mg  240 mg Oral Q24H Caren Griffins, MD   240 mg at 07/03/16 1044     Discharge Medications: Please see discharge summary for a list of discharge medications.  Relevant Imaging Results:  Relevant Lab Results:   Additional Information SSN: [id number]  Standley Brooking, LCSW

## 2016-07-03 NOTE — Clinical Social Work Note (Signed)
Clinical Social Work Assessment  Patient Details  Name: [name] MRN: [id number] Date of Birth: [birth year]  Date of referral:  07/03/16               Reason for consult:  Facility Placement                Permission sought to share information with:  Chartered certified accountant granted to share information::  Yes, Verbal Permission Granted  Name::        Agency::     Relationship::     Contact Information:     Housing/Transportation Living arrangements for the past 2 months:  Single Family Home Source of Information:  Adult Children, Spouse Patient Interpreter Needed:  None Criminal Activity/Legal Involvement Pertinent to Current Situation/Hospitalization:  No - Comment as needed Significant Relationships:  Adult Children, Spouse Lives with:  Spouse Do you feel safe going back to the place where you live?  No Need for family participation in patient care:  Yes (Comment)  Care giving concerns:  CSW reviewed PT evaluation recommending SNF at discharge.    Social Worker assessment / plan:  CSW met with patient's husband, [name] & son, [name] at bedside re: discharge plans. Patient's husband is agreeable with plan for SNF as he does not feel he could care for her currently.   Employment status:  Retired Nurse, adult PT Recommendations:  [place] / Referral to community resources:  [place]  Patient/Family's Response to care:  Patient's husband states that she has been to [facility] in the past and would prefer that she go there for rehab as it is most convenient to where they live. CSW confirmed with Clarene Critchley at [facility] that they would be able to take patient.   Patient/Family's Understanding of and Emotional Response to Diagnosis, Current Treatment, and Prognosis:    Emotional Assessment Appearance:  Appears stated age Attitude/Demeanor/Rapport:    Affect (typically  observed):    Orientation:  Oriented to Self, Oriented to Place, Oriented to  Time Alcohol / Substance use:    Psych involvement (Current and /or in the community):     Discharge Needs  Concerns to be addressed:    Readmission within the last 30 days:    Current discharge risk:    Barriers to Discharge:      Standley Brooking, LCSW 07/03/2016, 3:00 PM

## 2016-07-04 DIAGNOSIS — K219 Gastro-esophageal reflux disease without esophagitis: Secondary | ICD-10-CM | POA: Diagnosis not present

## 2016-07-04 DIAGNOSIS — Z66 Do not resuscitate: Secondary | ICD-10-CM | POA: Diagnosis not present

## 2016-07-04 DIAGNOSIS — N390 Urinary tract infection, site not specified: Secondary | ICD-10-CM | POA: Diagnosis not present

## 2016-07-04 DIAGNOSIS — R5381 Other malaise: Secondary | ICD-10-CM | POA: Diagnosis not present

## 2016-07-04 DIAGNOSIS — I10 Essential (primary) hypertension: Secondary | ICD-10-CM | POA: Diagnosis not present

## 2016-07-04 DIAGNOSIS — I447 Left bundle-branch block, unspecified: Secondary | ICD-10-CM | POA: Diagnosis not present

## 2016-07-04 DIAGNOSIS — H8110 Benign paroxysmal vertigo, unspecified ear: Secondary | ICD-10-CM

## 2016-07-04 DIAGNOSIS — J449 Chronic obstructive pulmonary disease, unspecified: Secondary | ICD-10-CM | POA: Diagnosis not present

## 2016-07-04 DIAGNOSIS — H3530 Unspecified macular degeneration: Secondary | ICD-10-CM | POA: Diagnosis not present

## 2016-07-04 DIAGNOSIS — Z79899 Other long term (current) drug therapy: Secondary | ICD-10-CM | POA: Diagnosis not present

## 2016-07-04 DIAGNOSIS — I480 Paroxysmal atrial fibrillation: Secondary | ICD-10-CM | POA: Diagnosis not present

## 2016-07-04 DIAGNOSIS — D464 Refractory anemia, unspecified: Secondary | ICD-10-CM | POA: Diagnosis not present

## 2016-07-04 DIAGNOSIS — I5030 Unspecified diastolic (congestive) heart failure: Secondary | ICD-10-CM | POA: Diagnosis not present

## 2016-07-04 DIAGNOSIS — Z7982 Long term (current) use of aspirin: Secondary | ICD-10-CM | POA: Diagnosis not present

## 2016-07-04 DIAGNOSIS — Z9981 Dependence on supplemental oxygen: Secondary | ICD-10-CM | POA: Diagnosis not present

## 2016-07-04 DIAGNOSIS — E0800 Diabetes mellitus due to underlying condition with hyperosmolarity without nonketotic hyperglycemic-hyperosmolar coma (NKHHC): Secondary | ICD-10-CM | POA: Diagnosis not present

## 2016-07-04 DIAGNOSIS — E785 Hyperlipidemia, unspecified: Secondary | ICD-10-CM | POA: Diagnosis not present

## 2016-07-04 DIAGNOSIS — D649 Anemia, unspecified: Secondary | ICD-10-CM | POA: Diagnosis not present

## 2016-07-04 DIAGNOSIS — R7309 Other abnormal glucose: Secondary | ICD-10-CM | POA: Diagnosis not present

## 2016-07-04 DIAGNOSIS — I110 Hypertensive heart disease with heart failure: Secondary | ICD-10-CM | POA: Diagnosis not present

## 2016-07-04 DIAGNOSIS — R531 Weakness: Secondary | ICD-10-CM | POA: Diagnosis not present

## 2016-07-04 DIAGNOSIS — J984 Other disorders of lung: Secondary | ICD-10-CM | POA: Diagnosis not present

## 2016-07-04 DIAGNOSIS — Z7951 Long term (current) use of inhaled steroids: Secondary | ICD-10-CM | POA: Diagnosis not present

## 2016-07-04 DIAGNOSIS — D6489 Other specified anemias: Secondary | ICD-10-CM | POA: Diagnosis not present

## 2016-07-04 DIAGNOSIS — I5032 Chronic diastolic (congestive) heart failure: Secondary | ICD-10-CM | POA: Diagnosis not present

## 2016-07-04 DIAGNOSIS — R42 Dizziness and giddiness: Secondary | ICD-10-CM | POA: Diagnosis not present

## 2016-07-04 DIAGNOSIS — D62 Acute posthemorrhagic anemia: Secondary | ICD-10-CM | POA: Diagnosis not present

## 2016-07-04 LAB — URINE CULTURE

## 2016-07-04 LAB — TYPE AND SCREEN
ABO/RH(D): O POS
Antibody Screen: NEGATIVE
UNIT DIVISION: 0
Unit division: 0

## 2016-07-04 LAB — CBC
HEMATOCRIT: 32.4 % — AB (ref 36.0–46.0)
HEMOGLOBIN: 9 g/dL — AB (ref 12.0–15.0)
MCH: 21.7 pg — ABNORMAL LOW (ref 26.0–34.0)
MCHC: 27.8 g/dL — AB (ref 30.0–36.0)
MCV: 78.1 fL (ref 78.0–100.0)
Platelets: 371 10*3/uL (ref 150–400)
RBC: 4.15 MIL/uL (ref 3.87–5.11)
RDW: 17.8 % — ABNORMAL HIGH (ref 11.5–15.5)
WBC: 9.2 10*3/uL (ref 4.0–10.5)

## 2016-07-04 MED ORDER — DIAZEPAM 5 MG PO TABS: 5.0000 mg | ORAL_TABLET | Freq: Every day | ORAL | 0 refills | Status: DC | PRN

## 2016-07-04 MED ORDER — ACETAMINOPHEN 325 MG PO TABS
650.0000 mg | ORAL_TABLET | Freq: Four times a day (QID) | ORAL | 0 refills | Status: AC | PRN
Start: 2016-07-04 — End: ?

## 2016-07-04 MED ORDER — NITROFURANTOIN MONOHYD MACRO 100 MG PO CAPS: 100.0000 mg | ORAL_CAPSULE | Freq: Two times a day (BID) | ORAL | 0 refills | Status: DC

## 2016-07-04 MED ORDER — FERROUS SULFATE 325 (65 FE) MG PO TABS: 325.0000 mg | ORAL_TABLET | Freq: Every day | ORAL | 0 refills | Status: AC

## 2016-07-04 NOTE — Progress Notes (Signed)
Report called to Barney at [facility].

## 2016-07-04 NOTE — Progress Notes (Signed)
PT[facility] called for transport. Patient, husband at [place], [facility] aware.    Raynaldo Opitz, [facility] Clinical Social Worker cell #: [phone]

## 2016-07-04 NOTE — H&P (Deleted)
Pt with orders to discharge.  Going to [facility].  Report called to Bear at facility.  [facility] will transport.

## 2016-07-04 NOTE — Clinical Social Work Placement (Addendum)
Patient is set to discharge to [facility] today. Patient & husband at bedside aware. Discharge packet given to RN, [facility]. [facility] will be called for transport once admission paperwork with facility has been completed.      Raynaldo Opitz, [facility] Clinical Social Worker cell #: [phone]    CLINICAL SOCIAL WORK PLACEMENT  NOTE  Date:  07/04/2016  Patient Details  Name: [name] MRN: [id number] Date of Birth: 10-Dec-1923  Clinical Social Work is seeking post-discharge placement for this patient at the [place] level of care (*CSW will initial, date and re-position this form in  chart as items are completed):  Yes   Patient/family provided with [place] Work Department's list of facilities offering this level of care within the geographic area requested by the patient (or if unable, by the patient's family).  Yes   Patient/family informed of their freedom to choose among providers that offer the needed level of care, that participate in Medicare, Medicaid or managed care program needed by the patient, have an available bed and are willing to accept the patient.  Yes   Patient/family informed of [place]'s ownership interest in [facility] and [facility], as well as of the fact that they are under no obligation to receive care at these facilities.  PASRR submitted to EDS on       PASRR number received on       Existing PASRR number confirmed on 07/03/16     FL2 transmitted to all facilities in geographic area requested by pt/family on 07/03/16     FL2 transmitted to all facilities within larger geographic area on       Patient informed that his/her managed care company has contracts with or will negotiate with certain facilities, including the following:        Yes   Patient/family informed of bed offers received.  Patient chooses bed at [facility]     Physician recommends and patient chooses bed  at      Patient to be transferred to [place] on 07/04/16.  Patient to be transferred to facility by PTAR     Patient family notified on 07/04/16 of transfer.  Name of family member notified:  patient's husband at bedside     PHYSICIAN       Additional Comment:    _______________________________________________ Standley Brooking, LCSW 07/04/2016, 11:03 AM

## 2016-07-04 NOTE — Discharge Summary (Addendum)
Physician Discharge Summary  [name] [id number] DOB: [birth year] DOA: 07/01/2016  PCP: Merrilee Seashore, MD  Admit date: 07/01/2016 Discharge date: 07/04/2016  Recommendations for Outpatient Follow-up:  1. Continue Macrobid for 5 days on discharge for urinary tract infection. 2. Please note patient was on aspirin 325 mg daily prior to the admission. We held that because of the risk of bleeding and low hemoglobin. 3. Please continue to monitor hemoglobin over next 1-2 weeks. If hemoglobin is stable, at least 9 or above then it is safe to resume aspirin however at the lower dose 81 mg daily.  Discharge Diagnoses:  Active Problems:   COPD (chronic obstructive pulmonary disease) (HCC)   Paroxysmal atrial fibrillation (HCC)   Hyperlipidemia   Essential hypertension   Macular degeneration   Anemia   GERD (gastroesophageal reflux disease)   Symptomatic anemia   Chronic diastolic CHF (congestive heart failure) ([place])    Discharge Condition: stable   Diet recommendation: as tolerated   History of present illness:  82 y.o.femalewith past medical history significant for HTN, HLD, COPD on home oxygen 2 L/m at bedtime, ongoing tobacco abuse who presented to [facility] with generalized weakness over past 1-2 weeks. She was seen in cardio office couple of days PTA and was subsequently called for low hemoglobin.  On admission, pt was stable hemodynamically. Hemoglobin was 9.9 and then 7.4. FOBT in ED was negative. She has received 1 unit of PRBC transfusion so far.  Hospital Course:    Assessment & Plan:   Active Problems: Generalized weakness / acute blood loss anemia - Unclear etiology. No evidence of gross bleeding. No reports of blood per rectum or hematuria.  - FOBT on admission was negative  - Hemoglobin 9.9 on the admission and today 9.0 - Has received 2 U PRBC transfusion so far in hospital  - Continue ferrous sulfate supplementation     COPD (chronic obstructive pulmonary  disease) (HCC) - Stable respiratory status - Continue as needed nebulizer treatment     Hyperlipidemia - Continue Lipitor 40 mg at bedtime    Essential hypertension - Continue metoprolol 50 mg daily    Chronic diastolic CHF (congestive heart failure) (HCC) - Compensated - Continue lasix 20 mg daily     Dizziness / Vertigo - Resume meclizine 25 mg every 12 hours as needed  - PT eval - SNF recommended     UTI - Detected on UA but urine culture with multiple species none predominant - Will continue Macrobid for 5 days on discharge. She was started on Rocephin yesterday and will get one dose today.  DVT prophylaxis: SCD's bilaterally  Code Status: DNR/DNI Family Communication:  son at the bedside    Consultants:   PT  Procedures:   1 U PRBC transfusion 9/11 and 1 U PRBC 9/13  Antimicrobials:   None    Signed:  Leisa Lenz, MD  [facility] 07/04/2016, 10:47 AM  Pager #: [phone]  Time spent in minutes: less than 30 minutes   Discharge Exam: Vitals:   07/04/16 0511 07/04/16 0951  BP: (!) 164/82 (!) 140/45  Pulse: 60 73  Resp:    Temp: 97.6 F (36.4 C)    Vitals:   07/03/16 1659 07/03/16 2055 07/04/16 0511 07/04/16 0951  BP: (!) 139/46 (!) 153/55 (!) 164/82 (!) 140/45  Pulse: 79 (!) 54 60 73  Resp: 19 18    Temp: 97.9 F (36.6 C) 99 F (37.2 C) 97.6 F (36.4 C)   TempSrc: Oral Oral Oral  SpO2: 97% 100% 95%   Weight:      Height:        General: Pt is alert, follows commands appropriately, not in acute distress Cardiovascular: Regular rate and rhythm, S1/S2 + Respiratory: Clear to auscultation bilaterally, no wheezing, no crackles, no rhonchi Abdominal: Soft, non tender, non distended, bowel sounds +, no guarding Extremities: no edema, no cyanosis, pulses palpable bilaterally DP and PT Neuro: Grossly nonfocal  Discharge Instructions  Discharge Instructions    Call MD for:  difficulty breathing, headache or visual  disturbances    Complete by:  As directed    Call MD for:  persistant nausea and vomiting    Complete by:  As directed    Call MD for:  redness, tenderness, or signs of infection (pain, swelling, redness, odor or green/yellow discharge around incision site)    Complete by:  As directed    Call MD for:  severe uncontrolled pain    Complete by:  As directed    Diet - low sodium heart healthy    Complete by:  As directed    Increase activity slowly    Complete by:  As directed        Medication List    STOP taking these medications   aspirin 325 MG tablet     TAKE these medications   acetaminophen 325 MG tablet Commonly known as:  TYLENOL Take 2 tablets (650 mg total) by mouth every 6 (six) hours as needed for mild pain (or Fever >/= 101).   albuterol (2.5 MG/3ML) 0.083% nebulizer solution Commonly known as:  PROVENTIL Take 2.5 mg by nebulization 4 (four) times daily.   albuterol 108 (90 Base) MCG/ACT inhaler Commonly known as:  PROVENTIL HFA;VENTOLIN HFA Inhale 2 puffs into the lungs every 6 (six) hours as needed for wheezing or shortness of breath.   atorvastatin 40 MG tablet Commonly known as:  LIPITOR Take 40 mg by mouth daily at 6 PM. Every other day   budesonide 0.5 MG/2ML nebulizer solution Commonly known as:  PULMICORT Take 0.5 mg by nebulization 2 (two) times daily.   diazepam 5 MG tablet Commonly known as:  VALIUM Take 1 tablet (5 mg total) by mouth daily as needed for anxiety. What changed:  See the new instructions.   ferrous sulfate 325 (65 FE) MG tablet Take 1 tablet (325 mg total) by mouth daily with breakfast. Start taking on:  07/05/2016   fish oil-omega-3 fatty acids 1000 MG capsule Take 1 g by mouth 2 (two) times daily.   furosemide 20 MG tablet Commonly known as:  LASIX Take 1 tablet (20 mg total) by mouth daily.   meclizine 12.5 MG tablet Commonly known as:  ANTIVERT Take 1 tablet (12.5 mg total) by mouth 3 (three) times daily. What  changed:  when to take this  reasons to take this   metoprolol succinate 50 MG 24 hr tablet Commonly known as:  TOPROL-XL Take 1 tablet (50 mg total) by mouth daily. Take with or immediately following a meal.   multivitamin with minerals Tabs tablet Take 1 tablet by mouth daily.   nitrofurantoin (macrocrystal-monohydrate) 100 MG capsule Commonly known as:  MACROBID Take 1 capsule (100 mg total) by mouth 2 (two) times daily.   OXYGEN Inhale 2 L into the lungs at bedtime. Reported on 12/04/2015   verapamil 240 MG (CO) 24 hr tablet Commonly known as:  COVERA HS Take 240 mg by mouth daily at 12 noon. noon  Follow-up Information    RAMACHANDRAN,AJITH, MD. Schedule an appointment as soon as possible for a visit in 1 week(s).   Specialty:  Internal Medicine Contact information: [address] [place] [address] [phone]            The results of significant diagnostics from this hospitalization (including imaging, microbiology, ancillary and laboratory) are listed below for reference.    Significant Diagnostic Studies: No results found.  Microbiology: Recent Results (from the past 240 hour(s))  Culture, Urine     Status: Abnormal   Collection Time: 07/03/16 10:58 AM  Result Value Ref Range Status   Specimen Description URINE, CLEAN CATCH  Final   Special Requests NONE  Final   Culture MULTIPLE SPECIES PRESENT, SUGGEST RECOLLECTION (A)  Final   Report Status 07/04/2016 FINAL  Final     Labs: Basic Metabolic Panel:  Recent Labs Lab 06/28/16 1154 07/01/16 1253 07/02/16 0513  NA 142 145 142  K 4.3 4.1 4.1  CL 104 99* 103  CO2 31  --  34*  GLUCOSE 86 92 94  BUN 19 20 16   CREATININE 0.57* 0.60 0.57  CALCIUM 9.1  --  8.8*   Liver Function Tests: No results for input(s): AST, ALT, ALKPHOS, BILITOT, PROT, ALBUMIN in the last 168 hours. No results for input(s): LIPASE, AMYLASE in the last 168 hours. No results for input(s): AMMONIA  in the last 168 hours. CBC:  Recent Labs Lab 06/28/16 1154 07/01/16 1230 07/01/16 1253 07/02/16 0021 07/02/16 0513 07/03/16 0503 07/04/16 0435  WBC 9.2 10.1  --   --  9.0 8.6 9.2  NEUTROABS 5,704 6.0  --   --   --   --   --   HGB 7.0* 7.2* 9.9* 7.4* 7.4* 7.8* 9.0*  HCT 25.8* 27.0* 29.0* 26.5* 26.9* 27.3* 32.4*  MCV 74.1* 76.1*  --   --  76.0* 74.6* 78.1  PLT 415* 385  --   --  339 346 371   Cardiac Enzymes: No results for input(s): CKTOTAL, CKMB, CKMBINDEX, TROPONINI in the last 168 hours. BNP: BNP (last 3 results)  Recent Labs  05/28/16 1509  BNP 174.5*    ProBNP (last 3 results) No results for input(s): PROBNP in the last 8760 hours.  CBG: No results for input(s): GLUCAP in the last 168 hours.

## 2016-07-04 NOTE — Discharge Instructions (Signed)
Nitrofurantoin tablets or capsules  What is this medicine?  NITROFURANTOIN (nye troe fyoor AN toyn) is an antibiotic. It is used to treat urinary tract infections.  This medicine may be used for other purposes; ask your health care provider or pharmacist if you have questions.  What should I tell my health care provider before I take this medicine?  They need to know if you have any of these conditions:  -anemia  -diabetes  -glucose-6-phosphate dehydrogenase deficiency  -kidney disease  -liver disease  -lung disease  -other chronic illness  -an unusual or allergic reaction to nitrofurantoin, other antibiotics, other medicines, foods, dyes or preservatives  -pregnant or trying to get pregnant  -breast-feeding  How should I use this medicine?  Take this medicine by mouth with a glass of water. Follow the directions on the prescription label. Take this medicine with food or milk. Take your doses at regular intervals. Do not take your medicine more often than directed. Do not stop taking except on your doctor's advice.  Talk to your pediatrician regarding the use of this medicine in children. While this drug may be prescribed for selected conditions, precautions do apply.  Overdosage: If you think you have taken too much of this medicine contact a poison control center or emergency room at once.  NOTE: This medicine is only for you. Do not share this medicine with others.  What if I miss a dose?  If you miss a dose, take it as soon as you can. If it is almost time for your next dose, take only that dose. Do not take double or extra doses.  What may interact with this medicine?  -antacids containing magnesium trisilicate  -probenecid  -quinolone antibiotics like ciprofloxacin, lomefloxacin, norfloxacin and ofloxacin  -sulfinpyrazone  This list may not describe all possible interactions. Give your health care provider a list of all the medicines, herbs, non-prescription drugs, or dietary supplements you use. Also tell  them if you smoke, drink alcohol, or use illegal drugs. Some items may interact with your medicine.  What should I watch for while using this medicine?  Tell your doctor or health care professional if your symptoms do not improve or if you get new symptoms. Drink several glasses of water a day. If you are taking this medicine for a long time, visit your doctor for regular checks on your progress.  If you are diabetic, you may get a false positive result for sugar in your urine with certain brands of urine tests. Check with your doctor.  What side effects may I notice from receiving this medicine?  Side effects that you should report to your doctor or health care professional as soon as possible:  -allergic reactions like skin rash or hives, swelling of the face, lips, or tongue  -chest pain  -cough  -difficulty breathing  -dizziness, drowsiness  -fever or infection  -joint aches or pains  -pale or blue-tinted skin  -redness, blistering, peeling or loosening of the skin, including inside the mouth  -tingling, burning, pain, or numbness in hands or feet  -unusual bleeding or bruising  -unusually weak or tired  -yellowing of eyes or skin  Side effects that usually do not require medical attention (report to your doctor or health care professional if they continue or are bothersome):  -dark urine  -diarrhea  -headache  -loss of appetite  -nausea or vomiting  -temporary hair loss  This list may not describe all possible side effects. Call your doctor for medical   advice about side effects. You may report side effects to FDA at 1-800-FDA-1088.  Where should I keep my medicine?  Keep out of the reach of children.  Store at room temperature between 15 and 30 degrees C (59 and 86 degrees F). Protect from light. Throw away any unused medicine after the expiration date.  NOTE: This sheet is a summary. It may not cover all possible information. If you have questions about this medicine, talk to your doctor, pharmacist, or health  care provider.      2016, Elsevier/Gold Standard. (2008-04-27 15:56:47)

## 2016-07-09 ENCOUNTER — Ambulatory Visit: Payer: Medicare Other | Admitting: Cardiovascular Disease

## 2016-07-10 DIAGNOSIS — D6489 Other specified anemias: Secondary | ICD-10-CM | POA: Diagnosis not present

## 2016-07-10 DIAGNOSIS — R5381 Other malaise: Secondary | ICD-10-CM | POA: Diagnosis not present

## 2016-07-10 DIAGNOSIS — J984 Other disorders of lung: Secondary | ICD-10-CM | POA: Diagnosis not present

## 2016-07-17 DIAGNOSIS — R7309 Other abnormal glucose: Secondary | ICD-10-CM | POA: Diagnosis not present

## 2016-07-17 DIAGNOSIS — D6489 Other specified anemias: Secondary | ICD-10-CM | POA: Diagnosis not present

## 2016-07-17 DIAGNOSIS — R5381 Other malaise: Secondary | ICD-10-CM | POA: Diagnosis not present

## 2016-07-17 DIAGNOSIS — I5032 Chronic diastolic (congestive) heart failure: Secondary | ICD-10-CM | POA: Diagnosis not present

## 2016-07-24 ENCOUNTER — Other Ambulatory Visit (HOSPITAL_COMMUNITY): Payer: Medicare Other

## 2016-07-24 DIAGNOSIS — Z87440 Personal history of urinary (tract) infections: Secondary | ICD-10-CM | POA: Diagnosis not present

## 2016-07-24 DIAGNOSIS — Z7982 Long term (current) use of aspirin: Secondary | ICD-10-CM | POA: Diagnosis not present

## 2016-07-24 DIAGNOSIS — H3530 Unspecified macular degeneration: Secondary | ICD-10-CM | POA: Diagnosis not present

## 2016-07-24 DIAGNOSIS — Z720 Tobacco use: Secondary | ICD-10-CM | POA: Diagnosis not present

## 2016-07-24 DIAGNOSIS — I110 Hypertensive heart disease with heart failure: Secondary | ICD-10-CM | POA: Diagnosis not present

## 2016-07-24 DIAGNOSIS — R262 Difficulty in walking, not elsewhere classified: Secondary | ICD-10-CM | POA: Diagnosis not present

## 2016-07-24 DIAGNOSIS — M6281 Muscle weakness (generalized): Secondary | ICD-10-CM | POA: Diagnosis not present

## 2016-07-24 DIAGNOSIS — Z9981 Dependence on supplemental oxygen: Secondary | ICD-10-CM | POA: Diagnosis not present

## 2016-07-24 DIAGNOSIS — J449 Chronic obstructive pulmonary disease, unspecified: Secondary | ICD-10-CM | POA: Diagnosis not present

## 2016-07-24 DIAGNOSIS — I480 Paroxysmal atrial fibrillation: Secondary | ICD-10-CM | POA: Diagnosis not present

## 2016-07-24 DIAGNOSIS — K219 Gastro-esophageal reflux disease without esophagitis: Secondary | ICD-10-CM | POA: Diagnosis not present

## 2016-07-24 DIAGNOSIS — I5032 Chronic diastolic (congestive) heart failure: Secondary | ICD-10-CM | POA: Diagnosis not present

## 2016-07-24 DIAGNOSIS — R42 Dizziness and giddiness: Secondary | ICD-10-CM | POA: Diagnosis not present

## 2016-07-24 DIAGNOSIS — D649 Anemia, unspecified: Secondary | ICD-10-CM | POA: Diagnosis not present

## 2016-07-29 DIAGNOSIS — J449 Chronic obstructive pulmonary disease, unspecified: Secondary | ICD-10-CM | POA: Diagnosis not present

## 2016-07-29 DIAGNOSIS — Z7982 Long term (current) use of aspirin: Secondary | ICD-10-CM | POA: Diagnosis not present

## 2016-07-29 DIAGNOSIS — D649 Anemia, unspecified: Secondary | ICD-10-CM | POA: Diagnosis not present

## 2016-07-29 DIAGNOSIS — H3530 Unspecified macular degeneration: Secondary | ICD-10-CM | POA: Diagnosis not present

## 2016-07-29 DIAGNOSIS — R42 Dizziness and giddiness: Secondary | ICD-10-CM | POA: Diagnosis not present

## 2016-07-29 DIAGNOSIS — Z9981 Dependence on supplemental oxygen: Secondary | ICD-10-CM | POA: Diagnosis not present

## 2016-07-29 DIAGNOSIS — I480 Paroxysmal atrial fibrillation: Secondary | ICD-10-CM | POA: Diagnosis not present

## 2016-07-29 DIAGNOSIS — Z87440 Personal history of urinary (tract) infections: Secondary | ICD-10-CM | POA: Diagnosis not present

## 2016-07-29 DIAGNOSIS — I110 Hypertensive heart disease with heart failure: Secondary | ICD-10-CM | POA: Diagnosis not present

## 2016-07-29 DIAGNOSIS — K219 Gastro-esophageal reflux disease without esophagitis: Secondary | ICD-10-CM | POA: Diagnosis not present

## 2016-07-29 DIAGNOSIS — M6281 Muscle weakness (generalized): Secondary | ICD-10-CM | POA: Diagnosis not present

## 2016-07-29 DIAGNOSIS — R262 Difficulty in walking, not elsewhere classified: Secondary | ICD-10-CM | POA: Diagnosis not present

## 2016-07-29 DIAGNOSIS — I5032 Chronic diastolic (congestive) heart failure: Secondary | ICD-10-CM | POA: Diagnosis not present

## 2016-07-29 DIAGNOSIS — Z720 Tobacco use: Secondary | ICD-10-CM | POA: Diagnosis not present

## 2016-07-31 DIAGNOSIS — H3530 Unspecified macular degeneration: Secondary | ICD-10-CM | POA: Diagnosis not present

## 2016-07-31 DIAGNOSIS — M6281 Muscle weakness (generalized): Secondary | ICD-10-CM | POA: Diagnosis not present

## 2016-07-31 DIAGNOSIS — Z7982 Long term (current) use of aspirin: Secondary | ICD-10-CM | POA: Diagnosis not present

## 2016-07-31 DIAGNOSIS — Z720 Tobacco use: Secondary | ICD-10-CM | POA: Diagnosis not present

## 2016-07-31 DIAGNOSIS — I5032 Chronic diastolic (congestive) heart failure: Secondary | ICD-10-CM | POA: Diagnosis not present

## 2016-07-31 DIAGNOSIS — Z9981 Dependence on supplemental oxygen: Secondary | ICD-10-CM | POA: Diagnosis not present

## 2016-07-31 DIAGNOSIS — D649 Anemia, unspecified: Secondary | ICD-10-CM | POA: Diagnosis not present

## 2016-07-31 DIAGNOSIS — R42 Dizziness and giddiness: Secondary | ICD-10-CM | POA: Diagnosis not present

## 2016-07-31 DIAGNOSIS — J449 Chronic obstructive pulmonary disease, unspecified: Secondary | ICD-10-CM | POA: Diagnosis not present

## 2016-07-31 DIAGNOSIS — I110 Hypertensive heart disease with heart failure: Secondary | ICD-10-CM | POA: Diagnosis not present

## 2016-07-31 DIAGNOSIS — K219 Gastro-esophageal reflux disease without esophagitis: Secondary | ICD-10-CM | POA: Diagnosis not present

## 2016-07-31 DIAGNOSIS — R262 Difficulty in walking, not elsewhere classified: Secondary | ICD-10-CM | POA: Diagnosis not present

## 2016-07-31 DIAGNOSIS — Z87440 Personal history of urinary (tract) infections: Secondary | ICD-10-CM | POA: Diagnosis not present

## 2016-07-31 DIAGNOSIS — I480 Paroxysmal atrial fibrillation: Secondary | ICD-10-CM | POA: Diagnosis not present

## 2016-08-01 ENCOUNTER — Other Ambulatory Visit (HOSPITAL_COMMUNITY): Payer: Medicare Other

## 2016-08-02 DIAGNOSIS — M6281 Muscle weakness (generalized): Secondary | ICD-10-CM | POA: Diagnosis not present

## 2016-08-02 DIAGNOSIS — J449 Chronic obstructive pulmonary disease, unspecified: Secondary | ICD-10-CM | POA: Diagnosis not present

## 2016-08-02 DIAGNOSIS — R262 Difficulty in walking, not elsewhere classified: Secondary | ICD-10-CM | POA: Diagnosis not present

## 2016-08-02 DIAGNOSIS — D649 Anemia, unspecified: Secondary | ICD-10-CM | POA: Diagnosis not present

## 2016-08-05 DIAGNOSIS — Z9981 Dependence on supplemental oxygen: Secondary | ICD-10-CM | POA: Diagnosis not present

## 2016-08-05 DIAGNOSIS — M6281 Muscle weakness (generalized): Secondary | ICD-10-CM | POA: Diagnosis not present

## 2016-08-05 DIAGNOSIS — I480 Paroxysmal atrial fibrillation: Secondary | ICD-10-CM | POA: Diagnosis not present

## 2016-08-05 DIAGNOSIS — E782 Mixed hyperlipidemia: Secondary | ICD-10-CM | POA: Diagnosis not present

## 2016-08-05 DIAGNOSIS — Z720 Tobacco use: Secondary | ICD-10-CM | POA: Diagnosis not present

## 2016-08-05 DIAGNOSIS — I10 Essential (primary) hypertension: Secondary | ICD-10-CM | POA: Diagnosis not present

## 2016-08-05 DIAGNOSIS — Z87440 Personal history of urinary (tract) infections: Secondary | ICD-10-CM | POA: Diagnosis not present

## 2016-08-05 DIAGNOSIS — I5032 Chronic diastolic (congestive) heart failure: Secondary | ICD-10-CM | POA: Diagnosis not present

## 2016-08-05 DIAGNOSIS — R262 Difficulty in walking, not elsewhere classified: Secondary | ICD-10-CM | POA: Diagnosis not present

## 2016-08-05 DIAGNOSIS — D649 Anemia, unspecified: Secondary | ICD-10-CM | POA: Diagnosis not present

## 2016-08-05 DIAGNOSIS — I482 Chronic atrial fibrillation: Secondary | ICD-10-CM | POA: Diagnosis not present

## 2016-08-05 DIAGNOSIS — J449 Chronic obstructive pulmonary disease, unspecified: Secondary | ICD-10-CM | POA: Diagnosis not present

## 2016-08-05 DIAGNOSIS — I110 Hypertensive heart disease with heart failure: Secondary | ICD-10-CM | POA: Diagnosis not present

## 2016-08-05 DIAGNOSIS — R42 Dizziness and giddiness: Secondary | ICD-10-CM | POA: Diagnosis not present

## 2016-08-05 DIAGNOSIS — K219 Gastro-esophageal reflux disease without esophagitis: Secondary | ICD-10-CM | POA: Diagnosis not present

## 2016-08-05 DIAGNOSIS — H3530 Unspecified macular degeneration: Secondary | ICD-10-CM | POA: Diagnosis not present

## 2016-08-05 DIAGNOSIS — Z7982 Long term (current) use of aspirin: Secondary | ICD-10-CM | POA: Diagnosis not present

## 2016-08-09 DIAGNOSIS — D649 Anemia, unspecified: Secondary | ICD-10-CM | POA: Diagnosis not present

## 2016-08-09 DIAGNOSIS — I5032 Chronic diastolic (congestive) heart failure: Secondary | ICD-10-CM | POA: Diagnosis not present

## 2016-08-09 DIAGNOSIS — J449 Chronic obstructive pulmonary disease, unspecified: Secondary | ICD-10-CM | POA: Diagnosis not present

## 2016-08-09 DIAGNOSIS — M6281 Muscle weakness (generalized): Secondary | ICD-10-CM | POA: Diagnosis not present

## 2016-08-09 DIAGNOSIS — Z7982 Long term (current) use of aspirin: Secondary | ICD-10-CM | POA: Diagnosis not present

## 2016-08-09 DIAGNOSIS — Z720 Tobacco use: Secondary | ICD-10-CM | POA: Diagnosis not present

## 2016-08-09 DIAGNOSIS — Z9981 Dependence on supplemental oxygen: Secondary | ICD-10-CM | POA: Diagnosis not present

## 2016-08-09 DIAGNOSIS — I480 Paroxysmal atrial fibrillation: Secondary | ICD-10-CM | POA: Diagnosis not present

## 2016-08-09 DIAGNOSIS — H3530 Unspecified macular degeneration: Secondary | ICD-10-CM | POA: Diagnosis not present

## 2016-08-09 DIAGNOSIS — R262 Difficulty in walking, not elsewhere classified: Secondary | ICD-10-CM | POA: Diagnosis not present

## 2016-08-09 DIAGNOSIS — K219 Gastro-esophageal reflux disease without esophagitis: Secondary | ICD-10-CM | POA: Diagnosis not present

## 2016-08-09 DIAGNOSIS — R42 Dizziness and giddiness: Secondary | ICD-10-CM | POA: Diagnosis not present

## 2016-08-09 DIAGNOSIS — I110 Hypertensive heart disease with heart failure: Secondary | ICD-10-CM | POA: Diagnosis not present

## 2016-08-09 DIAGNOSIS — Z87440 Personal history of urinary (tract) infections: Secondary | ICD-10-CM | POA: Diagnosis not present

## 2016-08-12 DIAGNOSIS — E782 Mixed hyperlipidemia: Secondary | ICD-10-CM | POA: Diagnosis not present

## 2016-08-12 DIAGNOSIS — J432 Centrilobular emphysema: Secondary | ICD-10-CM | POA: Diagnosis not present

## 2016-08-12 DIAGNOSIS — G4733 Obstructive sleep apnea (adult) (pediatric): Secondary | ICD-10-CM | POA: Diagnosis not present

## 2016-08-12 DIAGNOSIS — I482 Chronic atrial fibrillation: Secondary | ICD-10-CM | POA: Diagnosis not present

## 2016-08-13 DIAGNOSIS — R42 Dizziness and giddiness: Secondary | ICD-10-CM | POA: Diagnosis not present

## 2016-08-13 DIAGNOSIS — Z87440 Personal history of urinary (tract) infections: Secondary | ICD-10-CM | POA: Diagnosis not present

## 2016-08-13 DIAGNOSIS — Z7982 Long term (current) use of aspirin: Secondary | ICD-10-CM | POA: Diagnosis not present

## 2016-08-13 DIAGNOSIS — D649 Anemia, unspecified: Secondary | ICD-10-CM | POA: Diagnosis not present

## 2016-08-13 DIAGNOSIS — M6281 Muscle weakness (generalized): Secondary | ICD-10-CM | POA: Diagnosis not present

## 2016-08-13 DIAGNOSIS — K219 Gastro-esophageal reflux disease without esophagitis: Secondary | ICD-10-CM | POA: Diagnosis not present

## 2016-08-13 DIAGNOSIS — I5032 Chronic diastolic (congestive) heart failure: Secondary | ICD-10-CM | POA: Diagnosis not present

## 2016-08-13 DIAGNOSIS — Z720 Tobacco use: Secondary | ICD-10-CM | POA: Diagnosis not present

## 2016-08-13 DIAGNOSIS — J449 Chronic obstructive pulmonary disease, unspecified: Secondary | ICD-10-CM | POA: Diagnosis not present

## 2016-08-13 DIAGNOSIS — R262 Difficulty in walking, not elsewhere classified: Secondary | ICD-10-CM | POA: Diagnosis not present

## 2016-08-13 DIAGNOSIS — I110 Hypertensive heart disease with heart failure: Secondary | ICD-10-CM | POA: Diagnosis not present

## 2016-08-13 DIAGNOSIS — H3530 Unspecified macular degeneration: Secondary | ICD-10-CM | POA: Diagnosis not present

## 2016-08-13 DIAGNOSIS — Z9981 Dependence on supplemental oxygen: Secondary | ICD-10-CM | POA: Diagnosis not present

## 2016-08-13 DIAGNOSIS — I480 Paroxysmal atrial fibrillation: Secondary | ICD-10-CM | POA: Diagnosis not present

## 2016-08-15 ENCOUNTER — Other Ambulatory Visit: Payer: Self-pay

## 2016-08-15 ENCOUNTER — Ambulatory Visit (HOSPITAL_COMMUNITY): Payer: Medicare Other | Attending: Cardiology

## 2016-08-15 DIAGNOSIS — E785 Hyperlipidemia, unspecified: Secondary | ICD-10-CM | POA: Diagnosis not present

## 2016-08-15 DIAGNOSIS — J449 Chronic obstructive pulmonary disease, unspecified: Secondary | ICD-10-CM | POA: Diagnosis not present

## 2016-08-15 DIAGNOSIS — Z720 Tobacco use: Secondary | ICD-10-CM | POA: Diagnosis not present

## 2016-08-15 DIAGNOSIS — I10 Essential (primary) hypertension: Secondary | ICD-10-CM

## 2016-08-15 DIAGNOSIS — Z9981 Dependence on supplemental oxygen: Secondary | ICD-10-CM | POA: Insufficient documentation

## 2016-08-15 DIAGNOSIS — I480 Paroxysmal atrial fibrillation: Secondary | ICD-10-CM | POA: Diagnosis not present

## 2016-08-15 DIAGNOSIS — M7989 Other specified soft tissue disorders: Secondary | ICD-10-CM | POA: Diagnosis not present

## 2016-08-16 DIAGNOSIS — R262 Difficulty in walking, not elsewhere classified: Secondary | ICD-10-CM | POA: Diagnosis not present

## 2016-08-16 DIAGNOSIS — M6281 Muscle weakness (generalized): Secondary | ICD-10-CM | POA: Diagnosis not present

## 2016-08-16 DIAGNOSIS — I480 Paroxysmal atrial fibrillation: Secondary | ICD-10-CM | POA: Diagnosis not present

## 2016-08-16 DIAGNOSIS — Z87440 Personal history of urinary (tract) infections: Secondary | ICD-10-CM | POA: Diagnosis not present

## 2016-08-16 DIAGNOSIS — J449 Chronic obstructive pulmonary disease, unspecified: Secondary | ICD-10-CM | POA: Diagnosis not present

## 2016-08-16 DIAGNOSIS — D649 Anemia, unspecified: Secondary | ICD-10-CM | POA: Diagnosis not present

## 2016-08-16 DIAGNOSIS — R42 Dizziness and giddiness: Secondary | ICD-10-CM | POA: Diagnosis not present

## 2016-08-16 DIAGNOSIS — H3530 Unspecified macular degeneration: Secondary | ICD-10-CM | POA: Diagnosis not present

## 2016-08-16 DIAGNOSIS — I110 Hypertensive heart disease with heart failure: Secondary | ICD-10-CM | POA: Diagnosis not present

## 2016-08-16 DIAGNOSIS — I5032 Chronic diastolic (congestive) heart failure: Secondary | ICD-10-CM | POA: Diagnosis not present

## 2016-08-16 DIAGNOSIS — Z9981 Dependence on supplemental oxygen: Secondary | ICD-10-CM | POA: Diagnosis not present

## 2016-08-16 DIAGNOSIS — K219 Gastro-esophageal reflux disease without esophagitis: Secondary | ICD-10-CM | POA: Diagnosis not present

## 2016-08-16 DIAGNOSIS — Z720 Tobacco use: Secondary | ICD-10-CM | POA: Diagnosis not present

## 2016-08-16 DIAGNOSIS — Z7982 Long term (current) use of aspirin: Secondary | ICD-10-CM | POA: Diagnosis not present

## 2016-08-19 DIAGNOSIS — D649 Anemia, unspecified: Secondary | ICD-10-CM | POA: Diagnosis not present

## 2016-08-19 DIAGNOSIS — I480 Paroxysmal atrial fibrillation: Secondary | ICD-10-CM | POA: Diagnosis not present

## 2016-08-19 DIAGNOSIS — Z87440 Personal history of urinary (tract) infections: Secondary | ICD-10-CM | POA: Diagnosis not present

## 2016-08-19 DIAGNOSIS — Z9981 Dependence on supplemental oxygen: Secondary | ICD-10-CM | POA: Diagnosis not present

## 2016-08-19 DIAGNOSIS — M6281 Muscle weakness (generalized): Secondary | ICD-10-CM | POA: Diagnosis not present

## 2016-08-19 DIAGNOSIS — R42 Dizziness and giddiness: Secondary | ICD-10-CM | POA: Diagnosis not present

## 2016-08-19 DIAGNOSIS — I110 Hypertensive heart disease with heart failure: Secondary | ICD-10-CM | POA: Diagnosis not present

## 2016-08-19 DIAGNOSIS — Z720 Tobacco use: Secondary | ICD-10-CM | POA: Diagnosis not present

## 2016-08-19 DIAGNOSIS — R262 Difficulty in walking, not elsewhere classified: Secondary | ICD-10-CM | POA: Diagnosis not present

## 2016-08-19 DIAGNOSIS — H3530 Unspecified macular degeneration: Secondary | ICD-10-CM | POA: Diagnosis not present

## 2016-08-19 DIAGNOSIS — Z7982 Long term (current) use of aspirin: Secondary | ICD-10-CM | POA: Diagnosis not present

## 2016-08-19 DIAGNOSIS — K219 Gastro-esophageal reflux disease without esophagitis: Secondary | ICD-10-CM | POA: Diagnosis not present

## 2016-08-19 DIAGNOSIS — J449 Chronic obstructive pulmonary disease, unspecified: Secondary | ICD-10-CM | POA: Diagnosis not present

## 2016-08-19 DIAGNOSIS — I5032 Chronic diastolic (congestive) heart failure: Secondary | ICD-10-CM | POA: Diagnosis not present

## 2016-08-21 DIAGNOSIS — I5032 Chronic diastolic (congestive) heart failure: Secondary | ICD-10-CM | POA: Diagnosis not present

## 2016-08-21 DIAGNOSIS — I480 Paroxysmal atrial fibrillation: Secondary | ICD-10-CM | POA: Diagnosis not present

## 2016-08-21 DIAGNOSIS — R262 Difficulty in walking, not elsewhere classified: Secondary | ICD-10-CM | POA: Diagnosis not present

## 2016-08-21 DIAGNOSIS — H3530 Unspecified macular degeneration: Secondary | ICD-10-CM | POA: Diagnosis not present

## 2016-08-21 DIAGNOSIS — K219 Gastro-esophageal reflux disease without esophagitis: Secondary | ICD-10-CM | POA: Diagnosis not present

## 2016-08-21 DIAGNOSIS — Z720 Tobacco use: Secondary | ICD-10-CM | POA: Diagnosis not present

## 2016-08-21 DIAGNOSIS — Z87440 Personal history of urinary (tract) infections: Secondary | ICD-10-CM | POA: Diagnosis not present

## 2016-08-21 DIAGNOSIS — D649 Anemia, unspecified: Secondary | ICD-10-CM | POA: Diagnosis not present

## 2016-08-21 DIAGNOSIS — J449 Chronic obstructive pulmonary disease, unspecified: Secondary | ICD-10-CM | POA: Diagnosis not present

## 2016-08-21 DIAGNOSIS — Z9981 Dependence on supplemental oxygen: Secondary | ICD-10-CM | POA: Diagnosis not present

## 2016-08-21 DIAGNOSIS — I110 Hypertensive heart disease with heart failure: Secondary | ICD-10-CM | POA: Diagnosis not present

## 2016-08-21 DIAGNOSIS — M6281 Muscle weakness (generalized): Secondary | ICD-10-CM | POA: Diagnosis not present

## 2016-08-21 DIAGNOSIS — Z7982 Long term (current) use of aspirin: Secondary | ICD-10-CM | POA: Diagnosis not present

## 2016-08-21 DIAGNOSIS — R42 Dizziness and giddiness: Secondary | ICD-10-CM | POA: Diagnosis not present

## 2016-08-23 ENCOUNTER — Telehealth: Payer: Self-pay | Admitting: Cardiovascular Disease

## 2016-08-23 NOTE — Telephone Encounter (Signed)
Follow up appt confirmed Monday. He needed no other information at this time.

## 2016-08-23 NOTE — Telephone Encounter (Signed)
Pt son is calling because he is wondering why is mother has an appt on Monday and what is she being seen for.l

## 2016-08-26 ENCOUNTER — Encounter: Payer: Self-pay | Admitting: Physician Assistant

## 2016-08-26 ENCOUNTER — Other Ambulatory Visit: Payer: Self-pay | Admitting: Physician Assistant

## 2016-08-26 ENCOUNTER — Ambulatory Visit (INDEPENDENT_AMBULATORY_CARE_PROVIDER_SITE_OTHER): Payer: Medicare Other | Admitting: Physician Assistant

## 2016-08-26 VITALS — BP 142/69 | HR 59 | Ht 66.0 in | Wt 150.0 lb

## 2016-08-26 DIAGNOSIS — M7989 Other specified soft tissue disorders: Secondary | ICD-10-CM | POA: Diagnosis not present

## 2016-08-26 DIAGNOSIS — W19XXXA Unspecified fall, initial encounter: Secondary | ICD-10-CM

## 2016-08-26 DIAGNOSIS — E785 Hyperlipidemia, unspecified: Secondary | ICD-10-CM | POA: Diagnosis not present

## 2016-08-26 DIAGNOSIS — I10 Essential (primary) hypertension: Secondary | ICD-10-CM | POA: Diagnosis not present

## 2016-08-26 DIAGNOSIS — I480 Paroxysmal atrial fibrillation: Secondary | ICD-10-CM | POA: Diagnosis not present

## 2016-08-26 MED ORDER — FUROSEMIDE 20 MG PO TABS
20.0000 mg | ORAL_TABLET | Freq: Every day | ORAL | 3 refills | Status: DC | PRN
Start: 2016-08-26 — End: 2017-01-31

## 2016-08-26 NOTE — Patient Instructions (Signed)
Almyra Deforest, PA-C, has recommended making the following medication changes: 1. DECREASE Aspirin to 81 mg daily  Your physician recommends that you have x-rays of your pelvis and tailbone.  Isaac Laud recommends that you schedule a follow-up appointment in 1 year with Dr Gwenlyn Found.   X-rays X-rays are tests that create pictures of the inside of your body using radiation. Different body parts absorb different amounts of radiation, which show up on the X-ray pictures in shades of black, gray, and white.  X-rays are used to look for many health conditions, including broken bones, lung problems, and some types of cancer.  LET [facility] CARE PROVIDER KNOW ABOUT:  Any allergies you have.  All medicines you are taking, including vitamins, herbs, eye drops, creams, and over-the-counter medicines.  Previous surgeries you have had.  Medical conditions you have. RISK AND COMPLICATIONS Getting an X-ray is a safe procedure.  BEFORE THE PROCEDURE  Tell the X-ray technician if you are pregnant or might be pregnant.  You may be asked to wear a protective lead apron to hide parts of your body from the X-ray.  You usually will need to undress whatever part of your body needs the X-ray. You will be given a hospital gown to wear, if needed.  You may need to remove your glasses, jewelry, and other metal objects. PROCEDURE   The X-ray machine creates a picture by using a tiny burst of radiation. It is painless.  You may need to have several pictures taken at different angles.  You will need to try to be as still as you can during the examination to get the best possible images. AFTER THE PROCEDURE  You will be able to resume your normal activities.  The X-ray will be examined by your health care provider or a radiology specialist.  It is your responsibility to get your test results. Ask your health care provider when to expect your results and how to get them.   This information is not intended to  replace advice given to you by your health care provider. Make sure you discuss any questions you have with your health care provider.   Document Released: 10/07/2005 Document Revised: 10/28/2014 Document Reviewed: 12/01/2013 Elsevier Interactive Patient Education [employer].

## 2016-08-26 NOTE — Progress Notes (Signed)
Cardiology Office Note    Date:  08/26/2016   ID:  [name], DOB Aug 30, 1924, MRN [id number]  PCP:  Brandi Seashore, MD  Cardiologist:  Dr. Gwenlyn Vaughn  Chief Complaint  Patient presents with  . Follow-up    pt states she fell last week     History of Present Illness:  [name] is a 82 y.o. female  with PMH of tobacco abuse, hypertension, hyperlipidemia, PAF and a family history of CAD with her brother having bypass surgery and father died of sudden cardiac death. She never had heart attack or stroke. She did have episodes of chest pain the past, however echocardiogram and Myoview was normal, therefore her chest pain was attributed to noncardiac causes. Her last echocardiogram obtained on 08/16/2013 showed EF 60-65%, severe septal hypertrophy with associated SAM of the mitral valve suggesting HOCM, grade 1 diastolic dysfunction, mild MR. It was felt that some of her dyspnea may be related to low preload in the setting of dynamic LVOT obstruction based on echocardiogram. The last Myoview obtained in October 2014 was normal. Patient's last office visit was on 11/14/2015, she still have chest pain, however has not change in frequency or severity. It appears patient was admitted in the hospital in 12/2015 for community-acquired pneumonia, during the hospitalization, it was mentioned that she had a history of paroxysmal atrial fibrillation, but it appears she was able to stay in [facility] during her admission. Due to her advanced age, she was felt not to be a candidate for systemic anticoagulation therapy.  She was recently seen in the urgent care 05/28/2016 for bilateral foot swelling started in March. It was discussed with the patient regarding etiology of heart lower leg swelling could be cardiac, pulmonary or venous insufficiency, she refused any further workup at the time. She was agreeable to use compression stocking. I saw the patient in the office on 06/28/2016, she had at least 2-3+ pitting edema  bilaterally. Her lung was clear at the time. I started her on 20 mg daily of Lasix which unfortunately was not sent to her pharmacy until the following Monday. Her hemoglobin came back severely low at 7.0 which was down from her norm of 10.2 in March 2017. I discussed with her son who transported her to local ED. She was transfused with two units of packed red blood cell. He was also diagnosed with UTI which was treated with Macrobid. Her aspirin was stopped, it was recommended for her to recheck her hemoglobin in one to 2 weeks after discharge, if her hemoglobin goes above 9.0, then can potentially consider restarting the aspirin at a lower dose. Her FOBT was negative in the ED. since discharge, she has obtained her echocardiogram on 08/15/2016, which showed EF [id number], grade 2 diastolic dysfunction, increased thickness of septum consistent with lipomatous hypertrophy.  Patient presents today for cardiology visit. She says she did fall last Saturday, and hit her right hip on the floor. She said she was reaching out to grab something when she fell. She does not remember hitting her tailbone, however her coccygeal area has been very painful to sit on since last week. Her right hip is sore on touch as well. She said she thinks she broke her tailbone. From a cardiac perspective, she has been doing relatively well. Her lower extremity edema has resolved after being given Lasix in the hospital, in fact, she never started on the Lasix after being discharged from the hospital. Since she does not appear to be fluid  overloaded on physical exam, I will make her Lasix PRN only. She says she was seen by her PCP last week, a week prior to that, she had some labs. I'm not sure if this include a CBC to check if she is still anemic. We will request the record for her PCPs office.   Past Medical History:  Diagnosis Date  . Cataract   . Chest pain   . COPD (chronic obstructive pulmonary disease) ([place])   . Debility 01/21/2016    . Hearing loss 01/21/2016  . Hyperglycemia 01/21/2016  . Hyperlipidemia   . Hypertension   . Left bundle branch block   . Macular degeneration 01/21/2016  . Major depression 01/29/2016  . Memory loss 01/21/2016  . Paroxysmal atrial fibrillation ([facility])   . Personal history of subdural hematoma   . Subdural hematoma ([place]) 2009  . Tobacco abuse     Past Surgical History:  Procedure Laterality Date  . ABDOMINAL HYSTERECTOMY    . APPENDECTOMY    . BRAIN SURGERY    . EYE SURGERY      Current Medications: Outpatient Medications Prior to Visit  Medication Sig Dispense Refill  . acetaminophen (TYLENOL) 325 MG tablet Take 2 tablets (650 mg total) by mouth every 6 (six) hours as needed for mild pain (or Fever >/= 101). 30 tablet 0  . albuterol (PROVENTIL HFA;VENTOLIN HFA) 108 (90 BASE) MCG/ACT inhaler Inhale 2 puffs into the lungs every 6 (six) hours as needed for wheezing or shortness of breath.     Marland Kitchen albuterol (PROVENTIL) (2.5 MG/3ML) 0.083% nebulizer solution Take 2.5 mg by nebulization 4 (four) times daily.    Marland Kitchen atorvastatin (LIPITOR) 40 MG tablet Take 40 mg by mouth daily at 6 PM. Every other day    . budesonide (PULMICORT) 0.5 MG/2ML nebulizer solution Take 0.5 mg by nebulization 2 (two) times daily.    . diazepam (VALIUM) 5 MG tablet Take 1 tablet (5 mg total) by mouth daily as needed for anxiety. 5 tablet 0  . ferrous sulfate 325 (65 FE) MG tablet Take 1 tablet (325 mg total) by mouth daily with breakfast. 30 tablet 0  . fish oil-omega-3 fatty acids 1000 MG capsule Take 1 g by mouth 2 (two) times daily.     . meclizine (ANTIVERT) 12.5 MG tablet Take 1 tablet (12.5 mg total) by mouth 3 (three) times daily. (Patient taking differently: Take 12.5 mg by mouth 3 (three) times daily as needed for dizziness. ) 21 tablet 0  . metoprolol succinate (TOPROL-XL) 50 MG 24 hr tablet Take 1 tablet (50 mg total) by mouth daily. Take with or immediately following a meal. 30 tablet 0  . Multiple Vitamin  (MULTIVITAMIN WITH MINERALS) TABS tablet Take 1 tablet by mouth daily.    . nitrofurantoin, macrocrystal-monohydrate, (MACROBID) 100 MG capsule Take 1 capsule (100 mg total) by mouth 2 (two) times daily. 10 capsule 0  . OXYGEN Inhale 2 L into the lungs at bedtime. Reported on 12/04/2015    . verapamil (COVERA HS) 240 MG (CO) 24 hr tablet Take 240 mg by mouth daily at 12 noon. noon    . furosemide (LASIX) 20 MG tablet Take 1 tablet (20 mg total) by mouth daily. 30 tablet 3   No facility-administered medications prior to visit.      Allergies:   Cefuroxime   Social History   Social History  . Marital status: Divorced    Spouse name: N/A  . Number of children: N/A  .  Years of education: N/A   Social History Main Topics  . Smoking status: Current Every Day Smoker    Packs/day: 1.00    Years: 73.00    Types: Cigarettes    Start date: 07/30/1945  . Smokeless tobacco: Never Used  . Alcohol use No  . Drug use: No  . Sexual activity: Not Asked   Other Topics Concern  . None   Social History Narrative  . None     Family History:  The patient's family history includes Diabetes in her mother; Heart disease in her father and mother.   ROS:   Please see the history of present illness.    ROS All other systems reviewed and are negative.   PHYSICAL EXAM:   VS:  BP (!) 142/69 (BP Location: Left Arm, Patient Position: Sitting, Cuff Size: Normal)   Pulse (!) 59   Ht 5\' 6"  (1.676 m)   Wt 150 lb (68 kg)   BMI 24.21 kg/m    GEN: Well nourished, well developed, in no acute distress  HEENT: normal  Neck: no JVD, carotid bruits, or masses Cardiac: RRR; no murmurs, rubs, or gallops,no edema  Respiratory:  clear to auscultation bilaterally, normal work of breathing GI: soft, nontender, nondistended, + BS MS: no deformity or atrophy  Skin: warm and dry, no rash Neuro:  Alert and Oriented x 3, Strength and sensation are intact Psych: euthymic mood, full affect  Wt Readings from Last 3  Encounters:  08/26/16 150 lb (68 kg)  07/01/16 154 lb 2 oz (69.9 kg)  06/28/16 158 lb (71.7 kg)      Studies/Labs Reviewed:   EKG:  EKG is not ordered today.   Recent Labs: 05/28/2016: ALT 8; Brain Natriuretic Peptide 174.5 06/28/2016: TSH 1.93 07/02/2016: BUN 16; Creatinine, Ser 0.57; Potassium 4.1; Sodium 142 07/04/2016: Hemoglobin 9.0; Platelets 371   Lipid Panel No results Vaughn for: CHOL, TRIG, HDL, CHOLHDL, VLDL, LDLCALC, LDLDIRECT  Additional studies/ records that were reviewed today include:   Echo 08/16/2013 LV EF: 60% -  65%  ------------------------------------------------------------ Indications:   786.05 Dyspnea.  ------------------------------------------------------------ History:  PMH: Hyperlipidemia Atrial fibrillation. Chronic obstructive pulmonary disease.  ------------------------------------------------------------ Study Conclusions  - Left ventricle: The cavity size was normal. There was moderate concentric hypertrophy with severe septal hypertrophy (1.7 cm) and associated SAM of the mitral valve, suggesting HOCM. The outflow tract gradient, however, is minimal at 5-8 mmHg at rest (valsalva not performed). Systolic function was normal. The estimated ejection fraction was in the range of 60% to 65%. Doppler parameters are consistent with abnormal left ventricular relaxation (grade 1 diastolic dysfunction). The E/e' ratio is >10, suggesting elevated LV filling pressure. - Mitral valve: Mildly thickened leaflets- SAM is noted. Mild regurgitation. - Left atrium: LA Volume/ BSA = 20.6 ml/m2 The atrium was normal in size. - Systemic veins: The IVC is small (<1.2 cm) and collapses spontaneously, suggesting volume depletion and a low RA pressure of 2 mmHg. Impressions:  - Dypnea could be related to low preload in the setting of dynamic LVOT obstruction - clinical correlation is advised.   Myoview  08/10/2013 Impression Exercise Capacity: Lexiscan with no exercise. BP Response: Normal blood pressure response. Clinical Symptoms: No symptoms. ECG Impression: No significant ECG changes with Lexiscan. Comparison with Prior Nuclear Study: No previous nuclear study performed   Overall Impression:Normal stress nuclear study.    Echo 08/15/2016 LV EF: 60% -   65%  Study Conclusions  - Left ventricle: The cavity size was normal.  There was moderate   concentric hypertrophy. Systolic function was normal. The   estimated ejection fraction was in the range of 60% to 65%. Wall   motion was normal; there were no regional wall motion   abnormalities. There was an increased relative contribution of   atrial contraction to ventricular filling. Features are   consistent with a pseudonormal left ventricular filling pattern,   with concomitant abnormal relaxation and increased filling   pressure (grade 2 diastolic dysfunction). - Aortic valve: Trileaflet; mildly thickened, mildly calcified   leaflets. - Mitral valve: There was trivial regurgitation. - Atrial septum: There was increased thickness of the septum,   consistent with lipomatous hypertrophy.     ASSESSMENT:    1. Swelling of lower extremity   2. Hyperlipidemia, unspecified hyperlipidemia type   3. Essential hypertension   4. PAF (paroxysmal atrial fibrillation) ([place])   5. Fall, initial encounter      PLAN:  In order of problems listed above:  1. LE swelling: Her lower extremity edema has completely resolved, there is no sign of fluid overload, since she has not been taking her Lasix, we will make PRN instead.   2. Fall: She had a fall last Wednesday, she did not seek medical attention. She says she was reaching out to grab something when she fell. She did hit her right hip, she is still sore in the area. She does not remember hitting her back, however she has been having significant amount of coccygeal pain  since last week. We will obtain x-ray image of both area, if negative, she can follow-up with her PCP for pain control. I have advised her to take Tylenol for now.  3. PAF  - This patients CHA2DS2-VASc Score and unadjusted Ischemic Stroke Rate (% per year) is equal to 7.2 % stroke rate/year from a score of 5  Above score calculated as 1 point each if present [CHF, HTN, DM, Vascular=MI/PAD/Aortic Plaque, Age if 65-74, or Female] Above score calculated as 2 points each if present [Age > 75, or Stroke/TIA/TE]  - Continue on Toprol-XL 50 mg daily and verapamil 240 mg daily.  - On 325 mg aspirin home, will change to 81 mg daily. Not on any systemic anticoagulation she is at very high risk for bleed and fall.  4. HTN: Blood pressure mildly elevated today, however she is in some degree of pain after the fall.  5. HLD: Managed by PCP.  6. Anemia: She recently underwent 2 units of packed red blood cell transfusion, unclear cause for anemia, negative FOBT. Anemia panel obtained in the hospital shows iron low 19, TIBC normal 438, saturation ratio low at 4, folate 12.9 normal.    Medication Adjustments/Labs and Tests Ordered: Current medicines are reviewed at length with the patient today.  Concerns regarding medicines are outlined above.  Medication changes, Labs and Tests ordered today are listed in the Patient Instructions below. Patient Instructions  Almyra Deforest, PA-C, has recommended making the following medication changes: 1. DECREASE Aspirin to 81 mg daily  Your physician recommends that you have x-rays of your pelvis and tailbone.  Isaac Laud recommends that you schedule a follow-up appointment in 1 year with Dr [name].   X-rays X-rays are tests that create pictures of the inside of your body using radiation. Different body parts absorb different amounts of radiation, which show up on the X-ray pictures in shades of black, gray, and white.  X-rays are used to look for many health conditions,  including broken bones,  lung problems, and some types of cancer.  LET [facility] CARE PROVIDER KNOW ABOUT:  Any allergies you have.  All medicines you are taking, including vitamins, herbs, eye drops, creams, and over-the-counter medicines.  Previous surgeries you have had.  Medical conditions you have. RISK AND COMPLICATIONS Getting an X-ray is a safe procedure.  BEFORE THE PROCEDURE  Tell the X-ray technician if you are pregnant or might be pregnant.  You may be asked to wear a protective lead apron to hide parts of your body from the X-ray.  You usually will need to undress whatever part of your body needs the X-ray. You will be given a hospital gown to wear, if needed.  You may need to remove your glasses, jewelry, and other metal objects. PROCEDURE   The X-ray machine creates a picture by using a tiny burst of radiation. It is painless.  You may need to have several pictures taken at different angles.  You will need to try to be as still as you can during the examination to get the best possible images. AFTER THE PROCEDURE  You will be able to resume your normal activities.  The X-ray will be examined by your health care provider or a radiology specialist.  It is your responsibility to get your test results. Ask your health care provider when to expect your results and how to get them.   This information is not intended to replace advice given to you by your health care provider. Make sure you discuss any questions you have with your health care provider.   Document Released: 10/07/2005 Document Revised: 10/28/2014 Document Reviewed: 12/01/2013 Elsevier Interactive Patient Education 2016 [place], [place], [place]  08/26/2016 12:21 PM    [facility] [place], [place], [place]  [address] Phone: [phone]; Fax: [phone]

## 2016-08-31 DIAGNOSIS — J449 Chronic obstructive pulmonary disease, unspecified: Secondary | ICD-10-CM | POA: Diagnosis not present

## 2016-09-20 ENCOUNTER — Ambulatory Visit (INDEPENDENT_AMBULATORY_CARE_PROVIDER_SITE_OTHER): Payer: Medicare Other | Admitting: Physician Assistant

## 2016-09-20 VITALS — BP 178/72 | HR 82 | Temp 97.5°F | Ht 66.0 in | Wt 149.6 lb

## 2016-09-20 DIAGNOSIS — R42 Dizziness and giddiness: Secondary | ICD-10-CM

## 2016-09-20 DIAGNOSIS — R300 Dysuria: Secondary | ICD-10-CM | POA: Diagnosis not present

## 2016-09-20 DIAGNOSIS — D649 Anemia, unspecified: Secondary | ICD-10-CM | POA: Diagnosis not present

## 2016-09-20 LAB — POCT URINALYSIS DIP (MANUAL ENTRY)
Bilirubin, UA: NEGATIVE
Blood, UA: NEGATIVE
Glucose, UA: NEGATIVE
Ketones, POC UA: NEGATIVE
Nitrite, UA: NEGATIVE
Protein Ur, POC: NEGATIVE
Spec Grav, UA: 1.015
Urobilinogen, UA: 0.2
pH, UA: 7

## 2016-09-20 LAB — POCT CBC
Granulocyte percent: 64.1 %G (ref 37–80)
HCT, POC: 33.9 % — AB (ref 37.7–47.9)
Hemoglobin: 11 g/dL — AB (ref 12.2–16.2)
Lymph, poc: 2.4 (ref 0.6–3.4)
MCH, POC: 27.4 pg (ref 27–31.2)
MCHC: 32.4 g/dL (ref 31.8–35.4)
MCV: 84.4 fL (ref 80–97)
MID (cbc): 0.8 (ref 0–0.9)
MPV: 7.7 fL (ref 0–99.8)
POC Granulocyte: 5.8 (ref 2–6.9)
POC LYMPH PERCENT: 26.6 %L (ref 10–50)
POC MID %: 9.3 %M (ref 0–12)
Platelet Count, POC: 291 10*3/uL (ref 142–424)
RBC: 4.01 M/uL — AB (ref 4.04–5.48)
RDW, POC: 19.8 %
WBC: 9.1 10*3/uL (ref 4.6–10.2)

## 2016-09-20 LAB — POC MICROSCOPIC URINALYSIS (UMFC): Mucus: ABSENT

## 2016-09-20 NOTE — Patient Instructions (Addendum)
  Diaper rash ointment  Make appt with PCP   IF you received an x-ray today, you will receive an invoice from [facility]. Please contact [facility] at [phone] with questions or concerns regarding your invoice.   IF you received labwork today, you will receive an invoice from [employer]. Please contact [facility] at [phone] with questions or concerns regarding your invoice.   Our billing staff will not be able to assist you with questions regarding bills from these companies.  You will be contacted with the lab results as soon as they are available. The fastest way to get your results is to activate your My Chart account. Instructions are located on the last page of this paperwork. If you have not heard from [place] regarding the results in 2 weeks, please contact this office.

## 2016-09-20 NOTE — Progress Notes (Signed)
[name]  [id number] DOB: 02-Sep-1924  Subjective:  Pt presents to clinic with concerns that she has a UTI.  She has urinary dysuria, urgency and frequency that she has had since March - she told her son about these symptoms today and he immediately brought her into the clinic.  Hallucinations since March - she sees and hears things - last hallucinations was over thanksgiving - she saw people and heard things that were not there - son states that she tends to discuss these happening after she has been around a large group of people - her son if with her today that she lives with and he states she has not told him about these at home.  She sleeps anytime, some short term memory problems - no change recently per son  She has fallen once this year - she was standing beside the counter and fell over but this was months ago - she walks with a walker  She is here with a son - she does not share things with her son  She was hospitalized in Sept and received a blood transfusion.  She has an appt with her PCP in March - she is typically seen once a year.  Her breathing is no different than it normal is. She uses nasal O2 all the time.  They tried to get an appt with PCP today but the office was closed.  Review of Systems  Constitutional: Negative for chills.  Genitourinary: Positive for dysuria.  Psychiatric/Behavioral: Positive for hallucinations. Negative for behavioral problems, confusion and sleep disturbance.    Patient Active Problem List   Diagnosis Date Noted  . Symptomatic anemia 07/01/2016  . Chronic diastolic CHF (congestive heart failure) ([place]) 07/01/2016  . GERD (gastroesophageal reflux disease) 02/12/2016  . Decubitus ulcer of sacral region, stage 2 01/29/2016  . Major depression 01/29/2016  . Hyperglycemia 01/21/2016  . Hearing loss 01/21/2016  . Macular degeneration 01/21/2016  . Memory loss 01/21/2016  . Anemia 01/21/2016  . Debility 01/21/2016  .  Pressure ulcer 01/15/2016  . CAP (community acquired pneumonia) 01/14/2016  . COPD exacerbation ([place]) 01/14/2016  . Acute on chronic respiratory failure with hypoxia ([place]) 01/14/2016  . Leukocytosis 01/14/2016  . Community acquired pneumonia 01/14/2016  . Paroxysmal atrial fibrillation ([place]) 07/30/2013  . Tobacco abuse 07/30/2013  . Hyperlipidemia 07/30/2013  . Essential hypertension 07/30/2013  . Family history of heart disease 07/30/2013  . Dyspnea on exertion 07/30/2013  . COPD (chronic obstructive pulmonary disease) ([place]) 04/06/2012    Current Outpatient Prescriptions on File Prior to Visit  Medication Sig Dispense Refill  . acetaminophen (TYLENOL) 325 MG tablet Take 2 tablets (650 mg total) by mouth every 6 (six) hours as needed for mild pain (or Fever >/= 101). 30 tablet 0  . albuterol (PROVENTIL HFA;VENTOLIN HFA) 108 (90 BASE) MCG/ACT inhaler Inhale 2 puffs into the lungs every 6 (six) hours as needed for wheezing or shortness of breath.     Marland Kitchen albuterol (PROVENTIL) (2.5 MG/3ML) 0.083% nebulizer solution Take 2.5 mg by nebulization 4 (four) times daily.    Marland Kitchen aspirin EC 81 MG tablet Take 81 mg by mouth daily.    Marland Kitchen atorvastatin (LIPITOR) 40 MG tablet Take 40 mg by mouth daily at 6 PM. Every other day    . budesonide (PULMICORT) 0.5 MG/2ML nebulizer solution Take 0.5 mg by nebulization 2 (two) times daily.    . diazepam (VALIUM) 5 MG tablet Take 1 tablet (5 mg total) by mouth  daily as needed for anxiety. 5 tablet 0  . ferrous sulfate 325 (65 FE) MG tablet Take 1 tablet (325 mg total) by mouth daily with breakfast. 30 tablet 0  . fish oil-omega-3 fatty acids 1000 MG capsule Take 1 g by mouth 2 (two) times daily.     . furosemide (LASIX) 20 MG tablet Take 1 tablet (20 mg total) by mouth daily as needed for fluid or edema. 30 tablet 3  . meclizine (ANTIVERT) 12.5 MG tablet Take 1 tablet (12.5 mg total) by mouth 3 (three) times daily. (Patient taking differently: Take 12.5 mg by mouth 3  (three) times daily as needed for dizziness. ) 21 tablet 0  . metoprolol succinate (TOPROL-XL) 50 MG 24 hr tablet Take 1 tablet (50 mg total) by mouth daily. Take with or immediately following a meal. 30 tablet 0  . Multiple Vitamin (MULTIVITAMIN WITH MINERALS) TABS tablet Take 1 tablet by mouth daily.    . OXYGEN Inhale 2 L into the lungs at bedtime. Reported on 12/04/2015    . verapamil (COVERA HS) 240 MG (CO) 24 hr tablet Take 240 mg by mouth daily at 12 noon. noon     No current facility-administered medications on file prior to visit.     Allergies  Allergen Reactions  . Cefuroxime Nausea And Vomiting    Pt patients past, family and social history were reviewed and updated.   Objective:  BP (!) 178/72   Pulse 82   Temp 97.5 F (36.4 C) (Oral)   Ht 5\' 6"  (1.676 m)   Wt 149 lb 9.6 oz (67.9 kg)   SpO2 97%   BMI 24.15 kg/m   Physical Exam  Constitutional: She is oriented to person, place, and time and well-developed, well-nourished, and in no distress.  HENT:  Head: Normocephalic and atraumatic.  Right Ear: External ear normal.  Left Ear: External ear normal.  Cardiovascular: Normal rate, regular rhythm and normal heart sounds.   No murmur heard. Pulmonary/Chest: Effort normal and breath sounds normal.  Abdominal: Soft. There is no tenderness. There is no CVA tenderness.  Neurological: She is alert and oriented to person, place, and time. Gait normal.  Skin: Skin is warm and dry.  Psychiatric: Mood, memory, affect and judgment normal.  ok historian for her age - she gives the history and the son only gives history when he is asked  Vitals reviewed.    Results for orders placed or performed in visit on 09/20/16  Urine culture  Result Value Ref Range   Culture ENTEROCOCCUS SPECIES    Colony Count Greater than 100,000 CFU/mL    Organism ID, Bacteria ENTEROCOCCUS SPECIES       Susceptibility   Enterococcus species -  (no method available)    AMPICILLIN <=2 Sensitive       LEVOFLOXACIN >=8 Resistant     NITROFURANTOIN <=16 Sensitive     VANCOMYCIN 1 Sensitive     TETRACYCLINE >=16 Resistant   POCT Microscopic Urinalysis ([facility])  Result Value Ref Range   WBC,UR,HPF,POC None None WBC/hpf   RBC,UR,HPF,POC None None RBC/hpf   Bacteria Moderate (A) None, Too numerous to count   Mucus Absent Absent   Epithelial Cells, UR Per Microscopy Few (A) None, Too numerous to count cells/hpf  POCT urinalysis dipstick  Result Value Ref Range   Color, UA yellow yellow   Clarity, UA clear clear   Glucose, UA negative negative   Bilirubin, UA negative negative   Ketones, POC UA negative negative  Spec Grav, UA 1.015    Blood, UA negative negative   pH, UA 7.0    Protein Ur, POC negative negative   Urobilinogen, UA 0.2    Nitrite, UA Negative Negative   Leukocytes, UA small (1+) (A) Negative  POCT CBC  Result Value Ref Range   WBC 9.1 4.6 - 10.2 K/uL   Lymph, poc 2.4 0.6 - 3.4   POC LYMPH PERCENT 26.6 10 - 50 %L   MID (cbc) 0.8 0 - 0.9   POC MID % 9.3 0 - 12 %M   POC Granulocyte 5.8 2 - 6.9   Granulocyte percent 64.1 37 - 80 %G   RBC 4.01 (A) 4.04 - 5.48 M/uL   Hemoglobin 11.0 (A) 12.2 - 16.2 g/dL   HCT, POC 33.9 (A) 37.7 - 47.9 %   MCV 84.4 80 - 97 fL   MCH, POC 27.4 27 - 31.2 pg   MCHC 32.4 31.8 - 35.4 g/dL   RDW, POC 19.8 %   Platelet Count, POC 291 142 - 424 K/uL   MPV 7.7 0 - 99.8 fL     Assessment and Plan :  Burning with urination - Plan: POCT Microscopic Urinalysis ([facility]), POCT urinalysis dipstick, Urine culture - no obvious UTI on lab today at visit.  Dizzy - Plan: POCT CBC -   Anemia, unspecified type - improved from 917 hospitalization  The hallucinations are not new and per the patient's son she is acting normal - she will hydrate to the level she has been hydrating recently.  They will call the PCP on Monday and get an earlier appt for her than March due to possible dementia and hallucinations - the PCP will determine whether a neurology  referral is indicated.  Pt and son agree with the plan.   D/w Dr Everlene Farrier  Treated with Keflex when the urine culture returned.     Windell Hummingbird PA-C  [facility] 09/24/2016 3:01 PM

## 2016-09-23 ENCOUNTER — Telehealth: Payer: Self-pay

## 2016-09-23 LAB — URINE CULTURE

## 2016-09-23 NOTE — Telephone Encounter (Signed)
Routed to weber  

## 2016-09-23 NOTE — Telephone Encounter (Signed)
Pt is requesting an antibodic for her cold she was seen on 09/20/16  Best number [phone]

## 2016-09-24 MED ORDER — CEPHALEXIN 250 MG PO CAPS: 250.0000 mg | ORAL_CAPSULE | Freq: Three times a day (TID) | ORAL | 0 refills | Status: DC

## 2016-09-24 NOTE — Telephone Encounter (Signed)
I saw her for a UTI - and she did have bacteria in her urine so she will need an abx for that.  I have sent it to the pharmacy - she will take it 3x/day - make sure she is staying hydrated

## 2016-09-25 NOTE — Telephone Encounter (Signed)
Notified pt of Abx and instr'd to drink plenty of water.

## 2016-09-30 DIAGNOSIS — J449 Chronic obstructive pulmonary disease, unspecified: Secondary | ICD-10-CM | POA: Diagnosis not present

## 2016-10-04 ENCOUNTER — Other Ambulatory Visit: Payer: Self-pay | Admitting: Physician Assistant

## 2016-10-07 IMAGING — CR DG CHEST 2V
3 series · 3 of 3 positions shown · non-contrast
Comparison: 07/27/2015

CLINICAL DATA: Cough, shortness of breath, nausea.  Flu.

EXAM:
CHEST  2 VIEW

[w chest lat (1 of 2)]
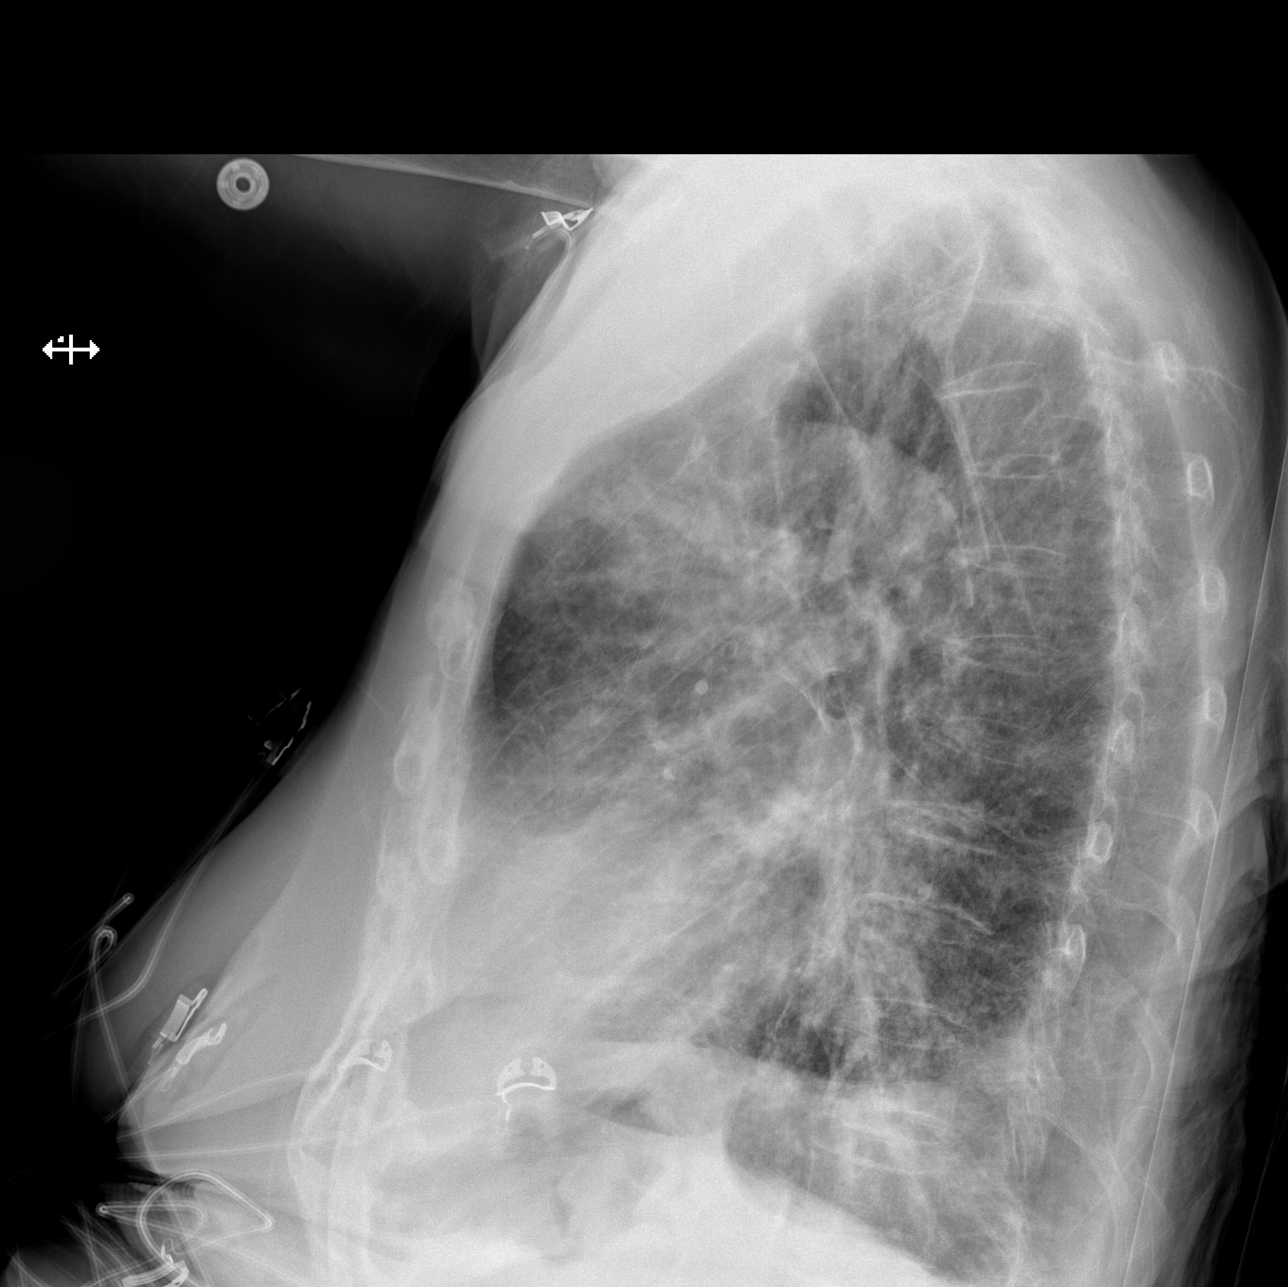

[w chest lat (2 of 2)]
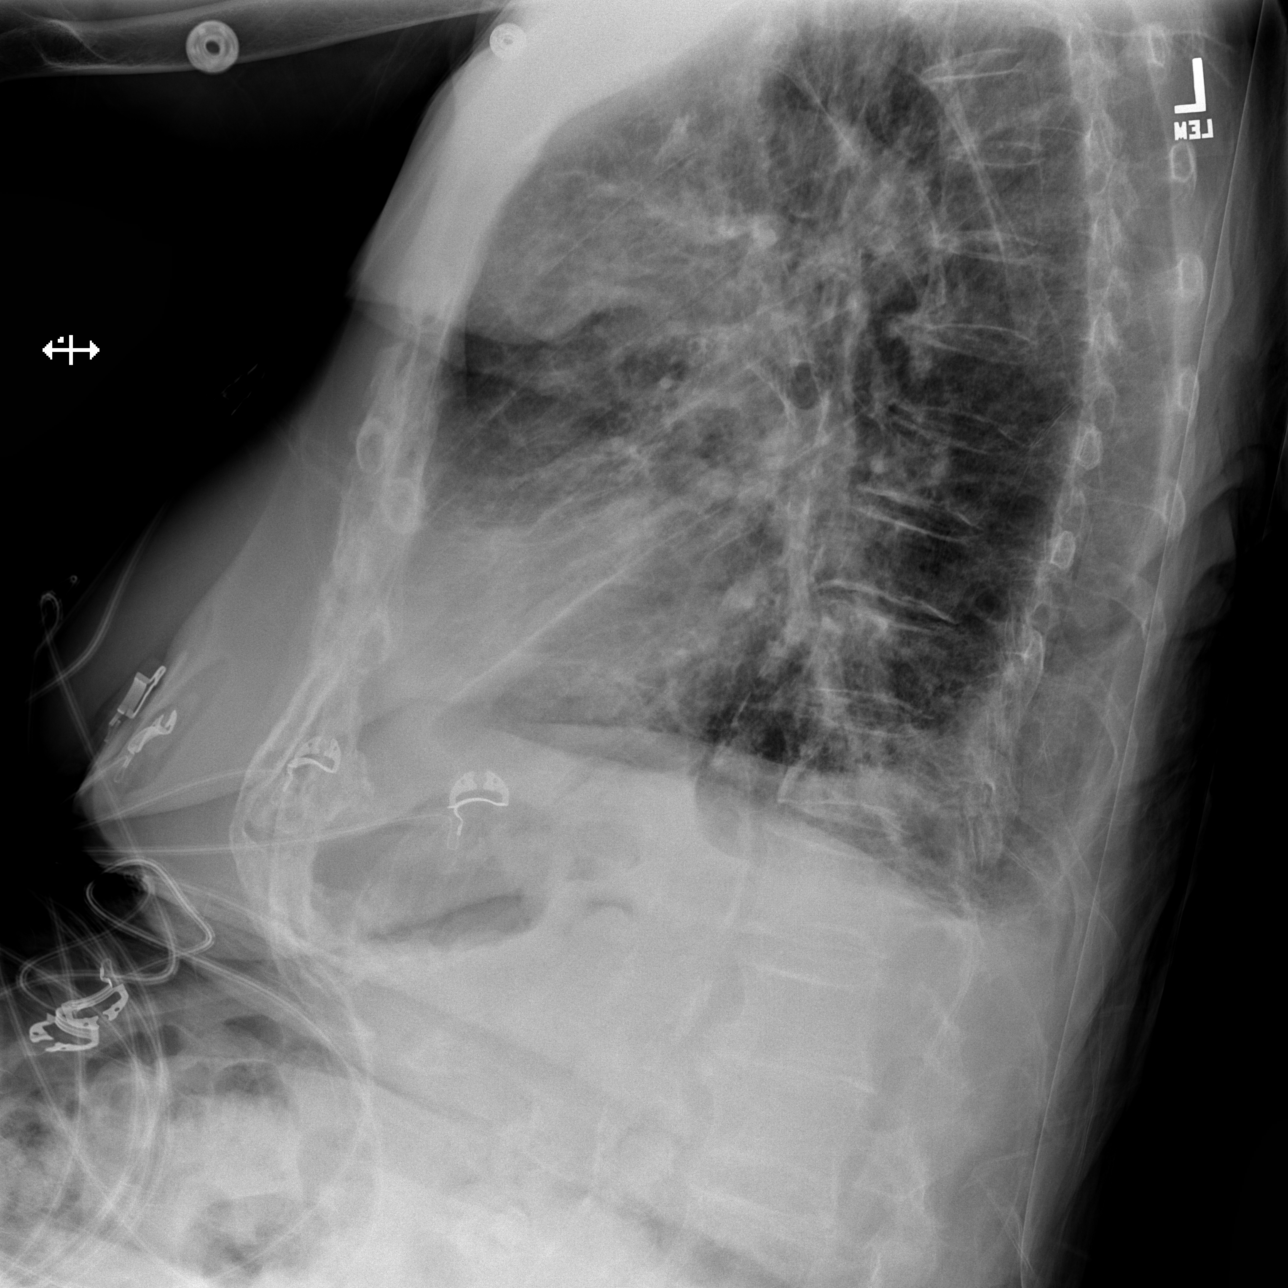

[x chest ap]
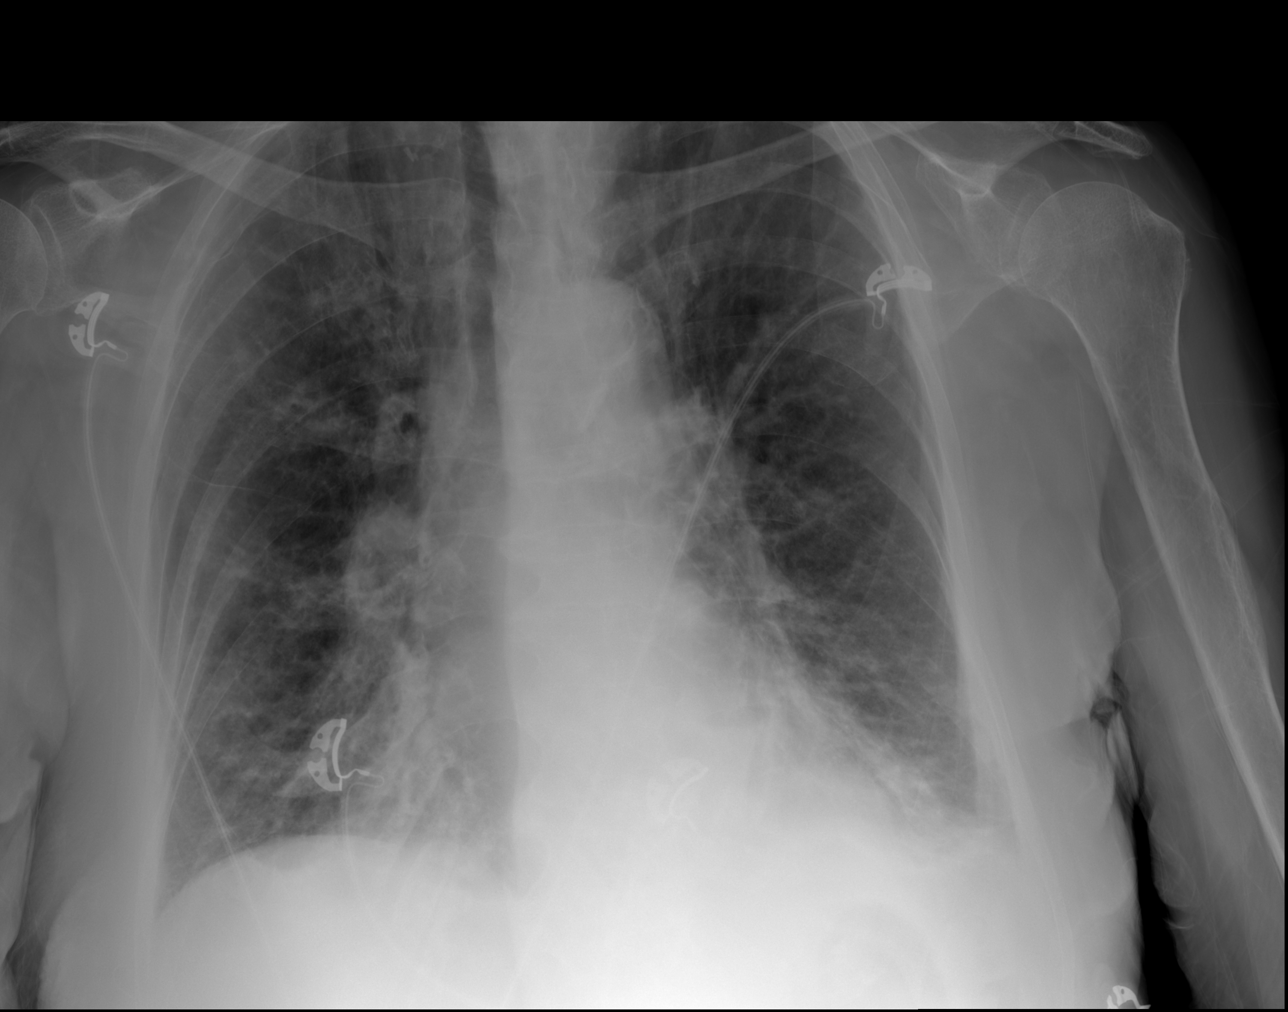

[3 of 3 positions shown; findings below may reference images not displayed]

FINDINGS: Increased interstitial markings, some of which may be chronic,
although mild interstitial edema is possible.

Patchy left lower lobe opacity, possibly atelectasis, pneumonia not
excluded.

Suspected small left pleural effusion.

Cardiomegaly.
IMPRESSION: Cardiomegaly with possible mild interstitial edema.

Patchy left lower lobe opacity, atelectasis versus pneumonia.

Suspected small left pleural effusion.

## 2016-10-10 IMAGING — DX DG CHEST 2V
2 series · 2 of 2 positions shown · non-contrast
Comparison: 01/14/2016

CLINICAL DATA: Shortness of breath.  Pneumonia.

EXAM:
CHEST  2 VIEW

[chest pa]
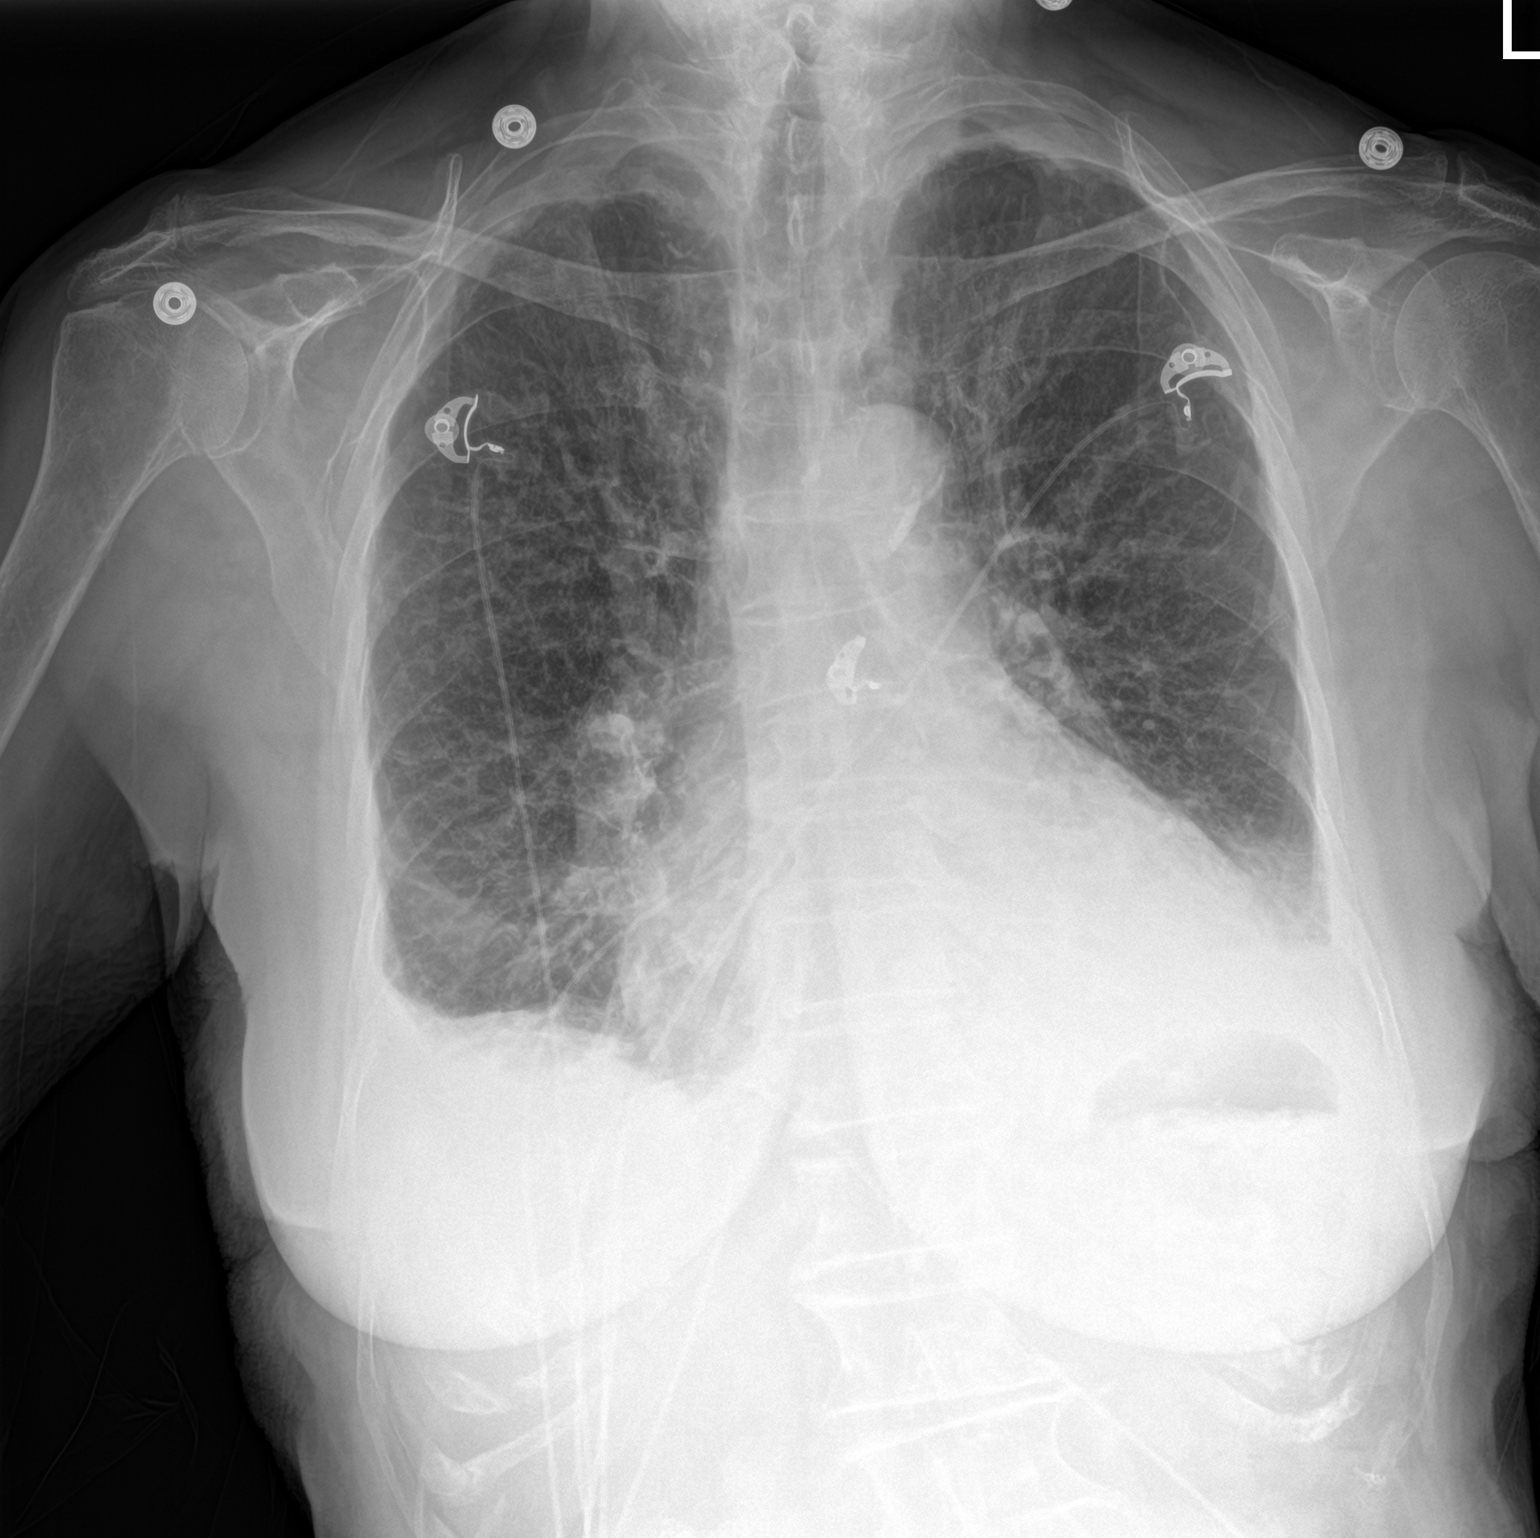

[chest lat]
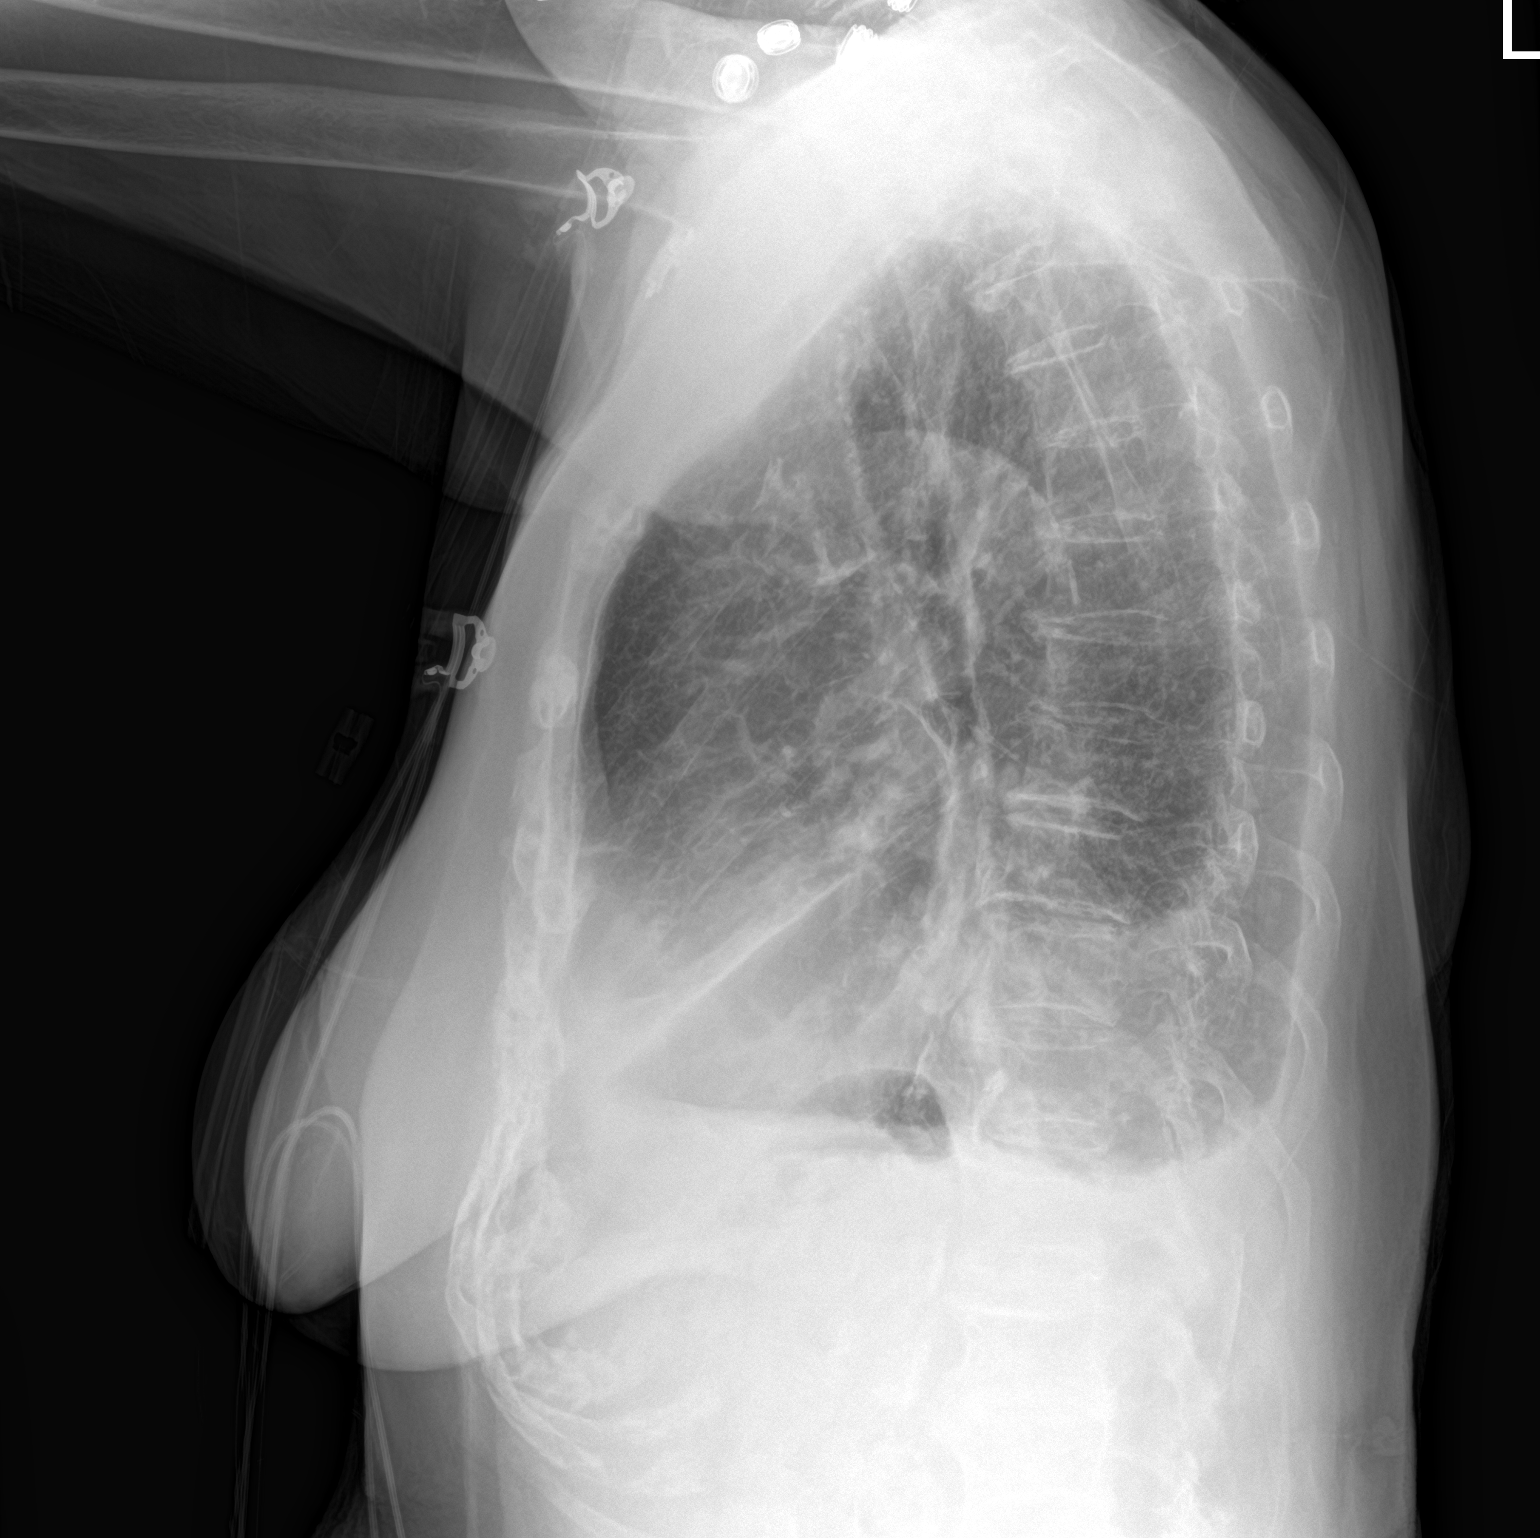

[2 of 2 positions shown; findings below may reference images not displayed]

FINDINGS: Chronic interstitial coarsening from bronchitic markings and
emphysema based on 3332 chest CT. Interstitial coarsening above
baseline with small but increased bilateral pleural effusion. There
is asymmetric hazy density at the left base. Chronic cardiomegaly
with stable aortic and hilar contours. Stable biapical pleural
thickening.
IMPRESSION: CHF pattern.History of pneumonia which could be superimposed at the
left base.

## 2016-10-17 ENCOUNTER — Ambulatory Visit (INDEPENDENT_AMBULATORY_CARE_PROVIDER_SITE_OTHER): Payer: Medicare Other | Admitting: Physician Assistant

## 2016-10-17 VITALS — BP 124/62 | HR 67 | Temp 97.8°F | Resp 16 | Ht 66.0 in | Wt 150.0 lb

## 2016-10-17 DIAGNOSIS — R300 Dysuria: Secondary | ICD-10-CM | POA: Diagnosis not present

## 2016-10-17 LAB — POCT URINALYSIS DIP (MANUAL ENTRY)
Bilirubin, UA: NEGATIVE
GLUCOSE UA: NEGATIVE
Ketones, POC UA: NEGATIVE
NITRITE UA: POSITIVE — AB
PH UA: 6.5
Spec Grav, UA: 1.025
UROBILINOGEN UA: 1

## 2016-10-17 LAB — POC MICROSCOPIC URINALYSIS (UMFC): Mucus: ABSENT

## 2016-10-17 MED ORDER — NITROFURANTOIN MONOHYD MACRO 100 MG PO CAPS: 100.0000 mg | ORAL_CAPSULE | Freq: Two times a day (BID) | ORAL | 0 refills | Status: AC

## 2016-10-17 NOTE — Patient Instructions (Signed)
     IF you received an x-ray today, you will receive an invoice from [facility]. Please contact [facility] at [phone] with questions or concerns regarding your invoice.   IF you received labwork today, you will receive an invoice from [facility]. Please contact [facility] at [phone] with questions or concerns regarding your invoice.   Our billing staff will not be able to assist you with questions regarding bills from these companies.  You will be contacted with the lab results as soon as they are available. The fastest way to get your results is to activate your My Chart account. Instructions are located on the last page of this paperwork. If you have not heard from us regarding the results in 2 weeks, please contact this office.     

## 2016-10-17 NOTE — Progress Notes (Signed)
Patient ID: [name], female    DOB: 1924-07-01, 82 y.o.   MRN: [id number]  PCP: Merrilee Seashore, MD  Chief Complaint  Patient presents with  . Dysuria    x 2 week     Subjective:   Presents for evaluation of dysuria x 1-2 weeks.  Burning with urination. "Like straws are sticking in me." Denies urgency and frequency, but states that there isn't much to come out. She was seen here 12/01 with similar symptoms and treated with cephalexin. She reports that her PCP told her to stop it, but her son states that she hasn't seen him recently. Son indicates that she may be confused about that. Urine culture at her visit 12/01 revealed enterococcus species resistant to Levofloxacin and tetracycline, sensitive to ampicillin and nitrofurantoin.  Reports visual hallucinations, speaking to people who are not there. None in the past 1-2 weeks.  Her son reports that she did not complain of any symptoms until this morning. She relates that the symptoms never went completely away after treatment with cephalexin.  No fever, chills, nausea, vomiting, increased confusion. Son has not witnessed more frequent trips to the bathroom.    Review of Systems     Patient Active Problem List   Diagnosis Date Noted  . Symptomatic anemia 07/01/2016  . Chronic diastolic CHF (congestive heart failure) ([place]) 07/01/2016  . GERD (gastroesophageal reflux disease) 02/12/2016  . Decubitus ulcer of sacral region, stage 2 01/29/2016  . Major depression 01/29/2016  . Hyperglycemia 01/21/2016  . Hearing loss 01/21/2016  . Macular degeneration 01/21/2016  . Memory loss 01/21/2016  . Anemia 01/21/2016  . Debility 01/21/2016  . Pressure ulcer 01/15/2016  . CAP (community acquired pneumonia) 01/14/2016  . COPD exacerbation ([place]) 01/14/2016  . Acute on chronic respiratory failure with hypoxia ([place]) 01/14/2016  . Leukocytosis 01/14/2016  . Community acquired pneumonia 01/14/2016  . Paroxysmal atrial  fibrillation ([place]) 07/30/2013  . Tobacco abuse 07/30/2013  . Hyperlipidemia 07/30/2013  . Essential hypertension 07/30/2013  . Family history of heart disease 07/30/2013  . Dyspnea on exertion 07/30/2013  . COPD (chronic obstructive pulmonary disease) ([place]) 04/06/2012     Prior to Admission medications   Medication Sig Start Date End Date Taking? Authorizing Provider  acetaminophen (TYLENOL) 325 MG tablet Take 2 tablets (650 mg total) by mouth every 6 (six) hours as needed for mild pain (or Fever >/= 101). 07/04/16  Yes Robbie Lis, MD  albuterol (PROVENTIL HFA;VENTOLIN HFA) 108 (90 BASE) MCG/ACT inhaler Inhale 2 puffs into the lungs every 6 (six) hours as needed for wheezing or shortness of breath.    Yes Historical Provider, MD  albuterol (PROVENTIL) (2.5 MG/3ML) 0.083% nebulizer solution Take 2.5 mg by nebulization 4 (four) times daily.   Yes Historical Provider, MD  aspirin EC 81 MG tablet Take 81 mg by mouth daily.   Yes Historical Provider, MD  atorvastatin (LIPITOR) 40 MG tablet Take 40 mg by mouth daily at 6 PM. Every other day   Yes Historical Provider, MD  budesonide (PULMICORT) 0.5 MG/2ML nebulizer solution Take 0.5 mg by nebulization 2 (two) times daily.   Yes Historical Provider, MD  diazepam (VALIUM) 5 MG tablet Take 1 tablet (5 mg total) by mouth daily as needed for anxiety. 07/04/16  Yes Robbie Lis, MD  ferrous sulfate 325 (65 FE) MG tablet Take 1 tablet (325 mg total) by mouth daily with breakfast. 07/05/16  Yes Robbie Lis, MD  fish oil-omega-3 fatty acids 1000 MG  capsule Take 1 g by mouth 2 (two) times daily.    Yes Historical Provider, MD  furosemide (LASIX) 20 MG tablet Take 1 tablet (20 mg total) by mouth daily as needed for fluid or edema. 08/26/16 11/24/16 Yes Almyra Deforest, PA  meclizine (ANTIVERT) 12.5 MG tablet Take 1 tablet (12.5 mg total) by mouth 3 (three) times daily. Patient taking differently: Take 12.5 mg by mouth 3 (three) times daily as needed for dizziness.   07/27/15  Yes Carmin Muskrat, MD  metoprolol succinate (TOPROL-XL) 50 MG 24 hr tablet Take 1 tablet (50 mg total) by mouth daily. Take with or immediately following a meal. 01/18/16  Yes Charlynne Cousins, MD  Multiple Vitamin (MULTIVITAMIN WITH MINERALS) TABS tablet Take 1 tablet by mouth daily.   Yes Historical Provider, MD  OXYGEN Inhale 2 L into the lungs at bedtime. Reported on 12/04/2015   Yes Historical Provider, MD  verapamil (COVERA HS) 240 MG (CO) 24 hr tablet Take 240 mg by mouth daily at 12 noon. noon   Yes Historical Provider, MD  cephALEXin (KEFLEX) 250 MG capsule Take 1 capsule (250 mg total) by mouth 3 (three) times daily. Patient not taking: Reported on 10/17/2016 09/24/16   Mancel Bale, PA-C     Allergies  Allergen Reactions  . Cefuroxime Nausea And Vomiting       Objective:  Physical Exam  Constitutional: She is oriented to person, place, and time. Vital signs are normal. She appears well-developed and well-nourished. No distress.  HENT:  Head: Normocephalic and atraumatic.  Hard of hearing  Eyes: No scleral icterus.  Neck: Neck supple. No thyromegaly present.  Cardiovascular: Normal rate, regular rhythm and normal heart sounds.   Pulmonary/Chest: Effort normal and breath sounds normal.  Abdominal: Soft. Normal appearance and bowel sounds are normal. She exhibits no distension and no mass. There is no hepatosplenomegaly. There is tenderness in the suprapubic area. There is no rigidity, no rebound, no guarding, no CVA tenderness, no tenderness at McBurney's point and negative Murphy's sign. No hernia.  Musculoskeletal: Normal range of motion.       Lumbar back: Normal.  Lymphadenopathy:    She has no cervical adenopathy.  Neurological: She is alert and oriented to person, place, and time.  Skin: Skin is warm and dry. No rash noted. She is not diaphoretic. No pallor.  Psychiatric: She has a normal mood and affect. Her speech is normal and behavior is normal.  Judgment normal. Cognition and memory are impaired.       Results for orders placed or performed in visit on 10/17/16  POCT urinalysis dipstick  Result Value Ref Range   Color, UA yellow yellow   Clarity, UA cloudy (A) clear   Glucose, UA negative negative   Bilirubin, UA negative negative   Ketones, POC UA negative negative   Spec Grav, UA 1.025    Blood, UA moderate (A) negative   pH, UA 6.5    Protein Ur, POC =100 (A) negative   Urobilinogen, UA 1.0    Nitrite, UA Positive (A) Negative   Leukocytes, UA large (3+) (A) Negative  POCT Microscopic Urinalysis ([facility])  Result Value Ref Range   WBC,UR,HPF,POC Too numerous to count  (A) None WBC/hpf   RBC,UR,HPF,POC Moderate (A) None RBC/hpf   Bacteria Moderate (A) None, Too numerous to count   Mucus Absent Absent   Epithelial Cells, UR Per Microscopy None None, Too numerous to count cells/hpf       Assessment &  Plan:   1. Dysuria Presumptive UTI. Treat empirically, guided by UCx 09/20/2016. Await updated UCx. - POCT urinalysis dipstick - POCT Microscopic Urinalysis ([facility]) - Urine culture - nitrofurantoin, macrocrystal-monohydrate, (MACROBID) 100 MG capsule; Take 1 capsule (100 mg total) by mouth 2 (two) times daily.  Dispense: 14 capsule; Refill: 0   Fara Chute, PA-C Physician Assistant-Certified [facility]

## 2016-10-20 ENCOUNTER — Telehealth: Payer: Self-pay | Admitting: Physician Assistant

## 2016-10-20 LAB — URINE CULTURE

## 2016-10-20 MED ORDER — CIPROFLOXACIN HCL 250 MG PO TABS: 250.0000 mg | ORAL_TABLET | Freq: Two times a day (BID) | ORAL | 0 refills | Status: AC

## 2016-10-20 NOTE — Telephone Encounter (Signed)
Spoke with patient. Stop  Nitrofurantoin. Start ciprofloxacin.  Meds ordered this encounter  Medications  . ciprofloxacin (CIPRO) 250 MG tablet    Sig: Take 1 tablet (250 mg total) by mouth 2 (two) times daily.    Dispense:  10 tablet    Refill:  0    Order Specific Question:   Supervising Provider    Answer:   Brigitte Pulse, EVA N [[id number]]

## 2016-10-31 DIAGNOSIS — J449 Chronic obstructive pulmonary disease, unspecified: Secondary | ICD-10-CM | POA: Diagnosis not present

## 2016-11-21 DIAGNOSIS — J449 Chronic obstructive pulmonary disease, unspecified: Secondary | ICD-10-CM | POA: Diagnosis not present

## 2016-12-01 DIAGNOSIS — J449 Chronic obstructive pulmonary disease, unspecified: Secondary | ICD-10-CM | POA: Diagnosis not present

## 2016-12-19 DIAGNOSIS — J449 Chronic obstructive pulmonary disease, unspecified: Secondary | ICD-10-CM | POA: Diagnosis not present

## 2016-12-29 DIAGNOSIS — J449 Chronic obstructive pulmonary disease, unspecified: Secondary | ICD-10-CM | POA: Diagnosis not present

## 2017-01-19 DIAGNOSIS — J449 Chronic obstructive pulmonary disease, unspecified: Secondary | ICD-10-CM | POA: Diagnosis not present

## 2017-01-29 DIAGNOSIS — J449 Chronic obstructive pulmonary disease, unspecified: Secondary | ICD-10-CM | POA: Diagnosis not present

## 2017-01-31 ENCOUNTER — Other Ambulatory Visit: Payer: Self-pay | Admitting: Physician Assistant

## 2017-02-03 NOTE — Telephone Encounter (Signed)
Will you please review for refill, Thanks! 

## 2017-02-18 DIAGNOSIS — J449 Chronic obstructive pulmonary disease, unspecified: Secondary | ICD-10-CM | POA: Diagnosis not present

## 2017-02-27 DIAGNOSIS — N390 Urinary tract infection, site not specified: Secondary | ICD-10-CM | POA: Diagnosis not present

## 2017-02-27 DIAGNOSIS — I482 Chronic atrial fibrillation: Secondary | ICD-10-CM | POA: Diagnosis not present

## 2017-02-27 DIAGNOSIS — E782 Mixed hyperlipidemia: Secondary | ICD-10-CM | POA: Diagnosis not present

## 2017-02-28 DIAGNOSIS — J449 Chronic obstructive pulmonary disease, unspecified: Secondary | ICD-10-CM | POA: Diagnosis not present

## 2017-03-04 DIAGNOSIS — R404 Transient alteration of awareness: Secondary | ICD-10-CM | POA: Diagnosis not present

## 2017-03-04 DIAGNOSIS — R531 Weakness: Secondary | ICD-10-CM | POA: Diagnosis not present

## 2017-03-06 DIAGNOSIS — Z23 Encounter for immunization: Secondary | ICD-10-CM | POA: Diagnosis not present

## 2017-03-06 DIAGNOSIS — I482 Chronic atrial fibrillation: Secondary | ICD-10-CM | POA: Diagnosis not present

## 2017-03-06 DIAGNOSIS — D509 Iron deficiency anemia, unspecified: Secondary | ICD-10-CM | POA: Diagnosis not present

## 2017-03-06 DIAGNOSIS — Z0000 Encounter for general adult medical examination without abnormal findings: Secondary | ICD-10-CM | POA: Diagnosis not present

## 2017-03-06 DIAGNOSIS — E782 Mixed hyperlipidemia: Secondary | ICD-10-CM | POA: Diagnosis not present

## 2017-03-06 DIAGNOSIS — J432 Centrilobular emphysema: Secondary | ICD-10-CM | POA: Diagnosis not present

## 2017-03-21 DIAGNOSIS — J449 Chronic obstructive pulmonary disease, unspecified: Secondary | ICD-10-CM | POA: Diagnosis not present

## 2017-03-31 DIAGNOSIS — J449 Chronic obstructive pulmonary disease, unspecified: Secondary | ICD-10-CM | POA: Diagnosis not present

## 2017-04-15 ENCOUNTER — Inpatient Hospital Stay (HOSPITAL_COMMUNITY)
Admission: EM | Admit: 2017-04-15 | Discharge: 2017-04-19 | DRG: 281 | Disposition: A | Discharge status: Skilled Nursing Facility | Payer: Medicare Other | Attending: Internal Medicine | Admitting: Internal Medicine

## 2017-04-15 ENCOUNTER — Emergency Department (HOSPITAL_COMMUNITY): Payer: Medicare Other

## 2017-04-15 ENCOUNTER — Inpatient Hospital Stay (HOSPITAL_COMMUNITY): Payer: Medicare Other

## 2017-04-15 ENCOUNTER — Encounter (HOSPITAL_COMMUNITY): Payer: Self-pay

## 2017-04-15 DIAGNOSIS — R911 Solitary pulmonary nodule: Secondary | ICD-10-CM | POA: Diagnosis not present

## 2017-04-15 DIAGNOSIS — Z79899 Other long term (current) drug therapy: Secondary | ICD-10-CM | POA: Diagnosis not present

## 2017-04-15 DIAGNOSIS — Z9981 Dependence on supplemental oxygen: Secondary | ICD-10-CM

## 2017-04-15 DIAGNOSIS — H9190 Unspecified hearing loss, unspecified ear: Secondary | ICD-10-CM | POA: Diagnosis present

## 2017-04-15 DIAGNOSIS — J9611 Chronic respiratory failure with hypoxia: Secondary | ICD-10-CM | POA: Diagnosis present

## 2017-04-15 DIAGNOSIS — K219 Gastro-esophageal reflux disease without esophagitis: Secondary | ICD-10-CM | POA: Diagnosis not present

## 2017-04-15 DIAGNOSIS — R531 Weakness: Secondary | ICD-10-CM | POA: Diagnosis not present

## 2017-04-15 DIAGNOSIS — F17210 Nicotine dependence, cigarettes, uncomplicated: Secondary | ICD-10-CM | POA: Diagnosis present

## 2017-04-15 DIAGNOSIS — E876 Hypokalemia: Secondary | ICD-10-CM | POA: Diagnosis present

## 2017-04-15 DIAGNOSIS — Z7189 Other specified counseling: Secondary | ICD-10-CM

## 2017-04-15 DIAGNOSIS — I447 Left bundle-branch block, unspecified: Secondary | ICD-10-CM | POA: Diagnosis not present

## 2017-04-15 DIAGNOSIS — R0602 Shortness of breath: Secondary | ICD-10-CM | POA: Diagnosis not present

## 2017-04-15 DIAGNOSIS — Z7951 Long term (current) use of inhaled steroids: Secondary | ICD-10-CM | POA: Diagnosis not present

## 2017-04-15 DIAGNOSIS — Z789 Other specified health status: Secondary | ICD-10-CM | POA: Diagnosis not present

## 2017-04-15 DIAGNOSIS — I480 Paroxysmal atrial fibrillation: Secondary | ICD-10-CM | POA: Diagnosis not present

## 2017-04-15 DIAGNOSIS — D649 Anemia, unspecified: Secondary | ICD-10-CM | POA: Diagnosis not present

## 2017-04-15 DIAGNOSIS — I10 Essential (primary) hypertension: Secondary | ICD-10-CM | POA: Diagnosis not present

## 2017-04-15 DIAGNOSIS — I214 Non-ST elevation (NSTEMI) myocardial infarction: Secondary | ICD-10-CM | POA: Diagnosis not present

## 2017-04-15 DIAGNOSIS — I5032 Chronic diastolic (congestive) heart failure: Secondary | ICD-10-CM | POA: Diagnosis not present

## 2017-04-15 DIAGNOSIS — Z515 Encounter for palliative care: Secondary | ICD-10-CM | POA: Diagnosis not present

## 2017-04-15 DIAGNOSIS — J439 Emphysema, unspecified: Secondary | ICD-10-CM | POA: Diagnosis present

## 2017-04-15 DIAGNOSIS — E785 Hyperlipidemia, unspecified: Secondary | ICD-10-CM | POA: Diagnosis present

## 2017-04-15 DIAGNOSIS — Z90710 Acquired absence of both cervix and uterus: Secondary | ICD-10-CM

## 2017-04-15 DIAGNOSIS — I6200 Nontraumatic subdural hemorrhage, unspecified: Secondary | ICD-10-CM | POA: Diagnosis not present

## 2017-04-15 DIAGNOSIS — R5381 Other malaise: Secondary | ICD-10-CM | POA: Diagnosis not present

## 2017-04-15 DIAGNOSIS — F0390 Unspecified dementia without behavioral disturbance: Secondary | ICD-10-CM | POA: Diagnosis present

## 2017-04-15 DIAGNOSIS — I5030 Unspecified diastolic (congestive) heart failure: Secondary | ICD-10-CM | POA: Diagnosis not present

## 2017-04-15 DIAGNOSIS — R079 Chest pain, unspecified: Secondary | ICD-10-CM

## 2017-04-15 DIAGNOSIS — I110 Hypertensive heart disease with heart failure: Secondary | ICD-10-CM | POA: Diagnosis not present

## 2017-04-15 DIAGNOSIS — Z888 Allergy status to other drugs, medicaments and biological substances status: Secondary | ICD-10-CM

## 2017-04-15 DIAGNOSIS — Z7982 Long term (current) use of aspirin: Secondary | ICD-10-CM

## 2017-04-15 DIAGNOSIS — G309 Alzheimer's disease, unspecified: Secondary | ICD-10-CM | POA: Diagnosis not present

## 2017-04-15 DIAGNOSIS — I213 ST elevation (STEMI) myocardial infarction of unspecified site: Secondary | ICD-10-CM | POA: Diagnosis not present

## 2017-04-15 DIAGNOSIS — J449 Chronic obstructive pulmonary disease, unspecified: Secondary | ICD-10-CM | POA: Diagnosis not present

## 2017-04-15 DIAGNOSIS — I240 Acute coronary thrombosis not resulting in myocardial infarction: Secondary | ICD-10-CM | POA: Diagnosis not present

## 2017-04-15 DIAGNOSIS — H3530 Unspecified macular degeneration: Secondary | ICD-10-CM | POA: Diagnosis not present

## 2017-04-15 LAB — D-DIMER, QUANTITATIVE (NOT AT ARMC): D DIMER QUANT: 0.89 ug{FEU}/mL — AB (ref 0.00–0.50)

## 2017-04-15 LAB — COMPREHENSIVE METABOLIC PANEL
ALBUMIN: 3.7 g/dL (ref 3.5–5.0)
ALT: 14 U/L (ref 14–54)
AST: 20 U/L (ref 15–41)
Alkaline Phosphatase: 73 U/L (ref 38–126)
Anion gap: 5 (ref 5–15)
BUN: 17 mg/dL (ref 6–20)
CHLORIDE: 98 mmol/L — AB (ref 101–111)
CO2: 42 mmol/L — ABNORMAL HIGH (ref 22–32)
Calcium: 8.8 mg/dL — ABNORMAL LOW (ref 8.9–10.3)
Creatinine, Ser: 0.58 mg/dL (ref 0.44–1.00)
GFR calc Af Amer: >60 mL/min (ref >60)
GFR calc non Af Amer: >60 mL/min (ref >60)
GLUCOSE: 142 mg/dL — AB (ref 65–99)
POTASSIUM: 3.3 mmol/L — AB (ref 3.5–5.1)
Sodium: 145 mmol/L (ref 135–145)
Total Bilirubin: 0.2 mg/dL — ABNORMAL LOW (ref 0.3–1.2)
Total Protein: 7.1 g/dL (ref 6.5–8.1)

## 2017-04-15 LAB — CBC WITH DIFFERENTIAL/PLATELET
BASOS PCT: 0 %
Basophils Absolute: 0 10*3/uL (ref 0.0–0.1)
Eosinophils Absolute: 0.3 10*3/uL (ref 0.0–0.7)
Eosinophils Relative: 4 %
HCT: 32.6 % — ABNORMAL LOW (ref 36.0–46.0)
HEMOGLOBIN: 9.1 g/dL — AB (ref 12.0–15.0)
LYMPHS ABS: 1.6 10*3/uL (ref 0.7–4.0)
Lymphocytes Relative: 19 %
MCH: 26.8 pg (ref 26.0–34.0)
MCHC: 27.9 g/dL — AB (ref 30.0–36.0)
MCV: 96.2 fL (ref 78.0–100.0)
MONOS PCT: 10 %
Monocytes Absolute: 0.8 10*3/uL (ref 0.1–1.0)
NEUTROS ABS: 5.6 10*3/uL (ref 1.7–7.7)
NEUTROS PCT: 67 %
Platelets: 309 10*3/uL (ref 150–400)
RBC: 3.39 MIL/uL — ABNORMAL LOW (ref 3.87–5.11)
RDW: 14.5 % (ref 11.5–15.5)
WBC: 8.3 10*3/uL (ref 4.0–10.5)

## 2017-04-15 LAB — URINALYSIS, ROUTINE W REFLEX MICROSCOPIC
Bilirubin Urine: NEGATIVE
Glucose, UA: NEGATIVE mg/dL
Hgb urine dipstick: NEGATIVE
KETONES UR: NEGATIVE mg/dL
Nitrite: NEGATIVE
PH: 6 (ref 5.0–8.0)
PROTEIN: NEGATIVE mg/dL
Specific Gravity, Urine: 1.009 (ref 1.005–1.030)

## 2017-04-15 LAB — LACTIC ACID, PLASMA: Lactic Acid, Venous: 1.6 mmol/L (ref 0.5–1.9)

## 2017-04-15 LAB — APTT: APTT: 34 s (ref 24–36)

## 2017-04-15 LAB — PROTIME-INR
INR: 1.02
PROTHROMBIN TIME: 13.5 s (ref 11.4–15.2)

## 2017-04-15 LAB — TROPONIN I
TROPONIN I: 2.82 ng/mL — AB (ref <0.03)
Troponin I: 3.07 ng/mL (ref <0.03)
Troponin I: 2.65 ng/mL (ref <0.03)

## 2017-04-15 LAB — BRAIN NATRIURETIC PEPTIDE: B Natriuretic Peptide: 147.1 pg/mL — ABNORMAL HIGH (ref 0.0–100.0)

## 2017-04-15 MED ORDER — HEPARIN BOLUS VIA INFUSION
4000.0000 [IU] | INTRAVENOUS | Status: AC
  Administered 2017-04-15: 4000 [IU] via INTRAVENOUS
  Filled 2017-04-15: qty 4000

## 2017-04-15 MED ORDER — POTASSIUM CHLORIDE CRYS ER 20 MEQ PO TBCR
40.0000 meq | EXTENDED_RELEASE_TABLET | Freq: Every day | ORAL | Status: DC
  Administered 2017-04-16 – 2017-04-19 (×4): 40 meq via ORAL
  Filled 2017-04-15 (×5): qty 2

## 2017-04-15 MED ORDER — POTASSIUM CHLORIDE CRYS ER 20 MEQ PO TBCR
40.0000 meq | EXTENDED_RELEASE_TABLET | Freq: Once | ORAL | Status: AC
  Administered 2017-04-15: 40 meq via ORAL
  Filled 2017-04-15: qty 2

## 2017-04-15 MED ORDER — IPRATROPIUM-ALBUTEROL 0.5-2.5 (3) MG/3ML IN SOLN
3.0000 mL | Freq: Four times a day (QID) | RESPIRATORY_TRACT | Status: DC
  Administered 2017-04-15: 3 mL via RESPIRATORY_TRACT
  Filled 2017-04-15: qty 3

## 2017-04-15 MED ORDER — ALBUTEROL SULFATE (2.5 MG/3ML) 0.083% IN NEBU
2.5000 mg | INHALATION_SOLUTION | Freq: Once | RESPIRATORY_TRACT | Status: AC
  Administered 2017-04-15: 2.5 mg via RESPIRATORY_TRACT
  Filled 2017-04-15: qty 3

## 2017-04-15 MED ORDER — ADULT MULTIVITAMIN W/MINERALS CH
1.0000 | ORAL_TABLET | Freq: Every day | ORAL | Status: DC
  Administered 2017-04-16 – 2017-04-19 (×3): 1 via ORAL
  Filled 2017-04-15 (×5): qty 1

## 2017-04-15 MED ORDER — IOPAMIDOL (ISOVUE-370) INJECTION 76%
INTRAVENOUS | Status: AC
  Filled 2017-04-15: qty 100

## 2017-04-15 MED ORDER — ONDANSETRON HCL 4 MG/2ML IJ SOLN: 4.0000 mg | Freq: Four times a day (QID) | INTRAMUSCULAR | Status: DC | PRN

## 2017-04-15 MED ORDER — IOPAMIDOL (ISOVUE-370) INJECTION 76%
100.0000 mL | Freq: Once | INTRAVENOUS | Status: AC | PRN
  Administered 2017-04-15: 77 mL via INTRAVENOUS

## 2017-04-15 MED ORDER — IPRATROPIUM-ALBUTEROL 0.5-2.5 (3) MG/3ML IN SOLN
3.0000 mL | Freq: Once | RESPIRATORY_TRACT | Status: AC
  Administered 2017-04-15: 3 mL via RESPIRATORY_TRACT
  Filled 2017-04-15: qty 3

## 2017-04-15 MED ORDER — ATORVASTATIN CALCIUM 40 MG PO TABS
40.0000 mg | ORAL_TABLET | Freq: Every day | ORAL | Status: DC
  Administered 2017-04-16 – 2017-04-18 (×3): 40 mg via ORAL
  Filled 2017-04-15 (×3): qty 1

## 2017-04-15 MED ORDER — VERAPAMIL HCL ER 240 MG PO TBCR
240.0000 mg | EXTENDED_RELEASE_TABLET | Freq: Every day | ORAL | Status: DC
  Administered 2017-04-16 – 2017-04-19 (×3): 240 mg via ORAL
  Filled 2017-04-15 (×4): qty 1

## 2017-04-15 MED ORDER — HEPARIN (PORCINE) IN NACL 100-0.45 UNIT/ML-% IJ SOLN
800.0000 [IU]/h | INTRAMUSCULAR | Status: AC
  Administered 2017-04-15 – 2017-04-16 (×2): 800 [IU]/h via INTRAVENOUS
  Filled 2017-04-15 (×2): qty 250

## 2017-04-15 MED ORDER — NITROGLYCERIN 0.4 MG SL SUBL: 0.4000 mg | SUBLINGUAL_TABLET | SUBLINGUAL | Status: DC | PRN

## 2017-04-15 MED ORDER — IPRATROPIUM-ALBUTEROL 0.5-2.5 (3) MG/3ML IN SOLN
3.0000 mL | Freq: Four times a day (QID) | RESPIRATORY_TRACT | Status: DC
  Administered 2017-04-16 – 2017-04-19 (×12): 3 mL via RESPIRATORY_TRACT
  Filled 2017-04-15 (×13): qty 3

## 2017-04-15 MED ORDER — FERROUS SULFATE 325 (65 FE) MG PO TABS
325.0000 mg | ORAL_TABLET | Freq: Every day | ORAL | Status: DC
  Administered 2017-04-16 – 2017-04-19 (×3): 325 mg via ORAL
  Filled 2017-04-15 (×5): qty 1

## 2017-04-15 MED ORDER — ASPIRIN 81 MG PO CHEW
324.0000 mg | CHEWABLE_TABLET | Freq: Once | ORAL | Status: AC
  Administered 2017-04-15: 324 mg via ORAL
  Filled 2017-04-15: qty 4

## 2017-04-15 MED ORDER — METOPROLOL SUCCINATE ER 50 MG PO TB24
50.0000 mg | ORAL_TABLET | Freq: Every day | ORAL | Status: DC
  Administered 2017-04-16 (×2): 50 mg via ORAL
  Filled 2017-04-15 (×2): qty 1

## 2017-04-15 MED ORDER — BUDESONIDE 0.5 MG/2ML IN SUSP
RESPIRATORY_TRACT | Status: AC
  Administered 2017-04-15: 0.5 mg via RESPIRATORY_TRACT
  Filled 2017-04-15: qty 2

## 2017-04-15 MED ORDER — DIAZEPAM 5 MG PO TABS: 5.0000 mg | ORAL_TABLET | Freq: Every day | ORAL | Status: DC | PRN

## 2017-04-15 MED ORDER — ACETAMINOPHEN 325 MG PO TABS
650.0000 mg | ORAL_TABLET | ORAL | Status: DC | PRN
  Administered 2017-04-16 (×2): 650 mg via ORAL
  Filled 2017-04-15 (×2): qty 2

## 2017-04-15 MED ORDER — BUDESONIDE 0.5 MG/2ML IN SUSP
0.5000 mg | Freq: Two times a day (BID) | RESPIRATORY_TRACT | Status: DC
  Administered 2017-04-15 – 2017-04-19 (×7): 0.5 mg via RESPIRATORY_TRACT
  Filled 2017-04-15 (×7): qty 2

## 2017-04-15 MED ORDER — ASPIRIN EC 81 MG PO TBEC
81.0000 mg | DELAYED_RELEASE_TABLET | Freq: Every day | ORAL | Status: DC
  Administered 2017-04-16 – 2017-04-19 (×4): 81 mg via ORAL
  Filled 2017-04-15 (×5): qty 1

## 2017-04-15 MED ORDER — ALBUTEROL SULFATE (2.5 MG/3ML) 0.083% IN NEBU: 2.5000 mg | INHALATION_SOLUTION | Freq: Four times a day (QID) | RESPIRATORY_TRACT | Status: DC

## 2017-04-15 NOTE — Progress Notes (Signed)
ANTICOAGULATION CONSULT NOTE - Initial Consult  Pharmacy Consult for heparin Indication: chest pain/ACS  Allergies  Allergen Reactions  . Cefuroxime Nausea And Vomiting    Patient Measurements: Height: 5\' 7"  (170.2 cm) Weight: 150 lb (68 kg) IBW/kg (Calculated) : 61.6 Heparin Dosing Weight: actual body weight  Vital Signs: Temp: 98 F (36.7 C) (06/26 1231) Temp Source: Oral (06/26 1231) BP: 144/60 (06/26 1459) Pulse Rate: 120 (06/26 1459)  Labs:  Recent Labs  04/15/17 1337  HGB 9.1*  HCT 32.6*  PLT 309  CREATININE 0.58  TROPONINI 3.07*    Estimated Creatinine Clearance: 44.5 mL/min (by C-G formula based on SCr of 0.58 mg/dL).   Medical History: Past Medical History:  Diagnosis Date  . Cataract   . Chest pain   . COPD (chronic obstructive pulmonary disease) ([place])   . Debility 01/21/2016  . Hearing loss 01/21/2016  . Hyperglycemia 01/21/2016  . Hyperlipidemia   . Hypertension   . Left bundle branch block   . Macular degeneration 01/21/2016  . Major depression 01/29/2016  . Memory loss 01/21/2016  . Paroxysmal atrial fibrillation ([facility])   . Personal history of subdural hematoma   . Subdural hematoma ([employer]) 2009  . Tobacco abuse     Assessment: 79 y/oF who presented to [facility] ED on 04/15/2017 with generalized weakness and SOB that started last night. She also states she had "chest soreness all day yesterday." Troponin elevated in ED at 3.07. D-dimer elevated at 0.89. CT of head with no acute intracranial abnormality. CTa of chest pending. Pharmacy consulted to start heparin infusion. Patient not on any anticoagulants PTA. CBC reveals Hgb 9.1, Pltc WNL. Baseline PT/INR 13.5/1.02, aPTT 34 seconds.   Goal of Therapy:  Heparin level 0.3-0.7 units/ml Monitor platelets by anticoagulation protocol: Yes   Plan:   Heparin 4000 units IV bolus x 1, then start heparin infusion at 800 units/hr  Heparin level 8 hours after heparin infusion initiated  Daily CBC and heparin  level  Monitor closely for s/sx of bleeding   Lindell Spar, PharmD, BCPS Pager: [phone] 04/15/2017 3:48 PM

## 2017-04-15 NOTE — H&P (Addendum)
History and Physical    PETER DAQUILA LOV:[id number] DOB: 1924-02-04 DOA: 04/15/2017  PCP: Merrilee Seashore, MD  Patient coming from:Home  Chief Complaint:generalized weakness and leg weakness  HPI: [name] is a 83 y.o. female with medical history significant of COPD, chronic hypoxic respiratory failure on 2 L of oxygen, hypertension, dyslipidemia, left bundle branch block, history of subdural hematoma in 2009, smoking, paroxysmal atrial fibrillation not on systemic anticoagulation presented with generalized weakness and not feeling well. Patient is a poor historian. She reported chest pain yesterday but denied any pain today. She stated that she was going to bathroom today when she feels weak in her both legs therefore came to the hospital. Denies shortness of breath, fever, chills, nausea or vomiting. Patient likely has dementia and therefore history is limited. She said she lives with her son at home. Reports smoking.  ED Course: In the ER patient was found to have elevated troponin of 3.07, elevated d-dimer. Cardiology consult was requested. Started on heparin drip and aspirin and admitted for further evaluation.  Review of Systems: As per HPI otherwise. Most of the review of system is limited because of patient's dementia.    Past Medical History:  Diagnosis Date  . Cataract   . Chest pain   . COPD (chronic obstructive pulmonary disease) ([place])   . Debility 01/21/2016  . Hearing loss 01/21/2016  . Hyperglycemia 01/21/2016  . Hyperlipidemia   . Hypertension   . Left bundle branch block   . Macular degeneration 01/21/2016  . Major depression 01/29/2016  . Memory loss 01/21/2016  . Paroxysmal atrial fibrillation ([facility])   . Personal history of subdural hematoma   . Subdural hematoma ([place]) 2009  . Tobacco abuse     Past Surgical History:  Procedure Laterality Date  . ABDOMINAL HYSTERECTOMY    . APPENDECTOMY    . BRAIN SURGERY    . EYE SURGERY      Social history: reports  that she has been smoking Cigarettes.  She started smoking about 71 years ago. She has a 73.00 pack-year smoking history. She has never used smokeless tobacco. She reports that she does not drink alcohol or use drugs.  Allergies  Allergen Reactions  . Cefuroxime Nausea And Vomiting    Family History  Problem Relation Age of Onset  . Diabetes Mother   . Heart disease Mother   . Heart disease Father      Prior to Admission medications   Medication Sig Start Date End Date Taking? Authorizing Provider  acetaminophen (TYLENOL) 325 MG tablet Take 2 tablets (650 mg total) by mouth every 6 (six) hours as needed for mild pain (or Fever >/= 101). 07/04/16  Yes Robbie Lis, MD  albuterol (PROVENTIL HFA;VENTOLIN HFA) 108 (90 BASE) MCG/ACT inhaler Inhale 2 puffs into the lungs every 6 (six) hours as needed for wheezing or shortness of breath.    Yes [provider]  albuterol (PROVENTIL) (2.5 MG/3ML) 0.083% nebulizer solution Take 2.5 mg by nebulization 4 (four) times daily.   Yes [provider]  aspirin EC 81 MG tablet Take 81 mg by mouth daily.   Yes [provider]  atorvastatin (LIPITOR) 40 MG tablet Take 40 mg by mouth daily at 6 PM. Every other day   Yes [provider]  budesonide (PULMICORT) 0.5 MG/2ML nebulizer solution Take 0.5 mg by nebulization 2 (two) times daily.   Yes [provider]  diazepam (VALIUM) 5 MG tablet Take 1 tablet (5 mg  total) by mouth daily as needed for anxiety. 07/04/16  Yes Robbie Lis, MD  ferrous sulfate 325 (65 FE) MG tablet Take 1 tablet (325 mg total) by mouth daily with breakfast. 07/05/16  Yes Robbie Lis, MD  fish oil-omega-3 fatty acids 1000 MG capsule Take 1 g by mouth 2 (two) times daily.    Yes [provider]  furosemide (LASIX) 20 MG tablet TAKE 1 TABLET (20 MG TOTAL) BY MOUTH DAILY AS NEEDED FOR FLUID OR EDEMA. 02/03/17 05/04/17 Yes Lorretta Harp, MD  meclizine (ANTIVERT) 12.5 MG tablet Take 1  tablet (12.5 mg total) by mouth 3 (three) times daily. Patient taking differently: Take 12.5 mg by mouth 3 (three) times daily as needed for dizziness.  07/27/15  Yes Carmin Muskrat, MD  metoprolol succinate (TOPROL-XL) 50 MG 24 hr tablet Take 1 tablet (50 mg total) by mouth daily. Take with or immediately following a meal. 01/18/16  Yes Charlynne Cousins, MD  Multiple Vitamin (MULTIVITAMIN WITH MINERALS) TABS tablet Take 1 tablet by mouth daily.   Yes [provider]  OXYGEN Inhale 2 L into the lungs at bedtime. Reported on 12/04/2015   Yes [provider]  verapamil (COVERA HS) 240 MG (CO) 24 hr tablet Take 240 mg by mouth daily at 12 noon. noon   Yes [provider]    Physical Exam: Vitals:   04/15/17 1231 04/15/17 1459 04/15/17 1535  BP: 116/77 (!) 144/60   Pulse: 78 (!) 120   Resp: 19 (!) 24   Temp: 98 F (36.7 C)    TempSrc: Oral    SpO2: 92% (!) 84%   Weight:   68 kg (150 lb)  Height:   5\' 7"  (1.702 m)      Constitutional: NAD, calm, comfortable Vitals:   04/15/17 1231 04/15/17 1459 04/15/17 1535  BP: 116/77 (!) 144/60   Pulse: 78 (!) 120   Resp: 19 (!) 24   Temp: 98 F (36.7 C)    TempSrc: Oral    SpO2: 92% (!) 84%   Weight:   68 kg (150 lb)  Height:   5\' 7"  (4.098 m)    ENMT: Mucous membranes are moist. Neck: normal, supple,  Respiratory: clear to auscultation bilaterally, no wheezing, no crackles. Normal respiratory effort. No accessory muscle use.  Cardiovascular: Regular rate and rhythm, no murmurs / rubs / gallops. No extremity edema.  Abdomen: Soft, nontender. Bowel sound positive. Not distended Musculoskeletal: no clubbing / cyanosis. No joint deformity upper and lower extremities.  Skin: no rashes, lesions, ulcers. No induration Neurologic: Alert, awake, oriented to [facility], her name and date of birth. Not oriented to current date Psychiatric:  Judgment impaired. Mood and affect is appropriate.   Labs on  Admission: I have personally reviewed following labs and imaging studies  CBC:  Recent Labs Lab 04/15/17 1337  WBC 8.3  NEUTROABS 5.6  HGB 9.1*  HCT 32.6*  MCV 96.2  PLT 119   Basic Metabolic Panel:  Recent Labs Lab 04/15/17 1337  NA 145  K 3.3*  CL 98*  CO2 42*  GLUCOSE 142*  BUN 17  CREATININE 0.58  CALCIUM 8.8*   GFR: Estimated Creatinine Clearance: 44.5 mL/min (by C-G formula based on SCr of 0.58 mg/dL). Liver Function Tests:  Recent Labs Lab 04/15/17 1337  AST 20  ALT 14  ALKPHOS 73  BILITOT 0.2*  PROT 7.1  ALBUMIN 3.7   No results for input(s): LIPASE, AMYLASE in the  last 168 hours. No results for input(s): AMMONIA in the last 168 hours. Coagulation Profile:  Recent Labs Lab 04/15/17 1517  INR 1.02   Cardiac Enzymes:  Recent Labs Lab 04/15/17 1337  TROPONINI 3.07*   BNP (last 3 results) No results for input(s): PROBNP in the last 8760 hours. HbA1C: No results for input(s): HGBA1C in the last 72 hours. CBG: No results for input(s): GLUCAP in the last 168 hours. Lipid Profile: No results for input(s): CHOL, HDL, LDLCALC, TRIG, CHOLHDL, LDLDIRECT in the last 72 hours. Thyroid Function Tests: No results for input(s): TSH, T4TOTAL, FREET4, T3FREE, THYROIDAB in the last 72 hours. Anemia Panel: No results for input(s): VITAMINB12, FOLATE, FERRITIN, TIBC, IRON, RETICCTPCT in the last 72 hours. Urine analysis:    Component Value Date/Time   COLORURINE YELLOW 04/15/2017 1259   APPEARANCEUR HAZY (A) 04/15/2017 1259   LABSPEC 1.009 04/15/2017 1259   PHURINE 6.0 04/15/2017 1259   GLUCOSEU NEGATIVE 04/15/2017 1259   HGBUR NEGATIVE 04/15/2017 1259   BILIRUBINUR NEGATIVE 04/15/2017 1259   BILIRUBINUR negative 10/17/2016 1252   KETONESUR NEGATIVE 04/15/2017 1259   PROTEINUR NEGATIVE 04/15/2017 1259   UROBILINOGEN 1.0 10/17/2016 1252   UROBILINOGEN 1.0 07/27/2015 1413   NITRITE NEGATIVE 04/15/2017 1259   LEUKOCYTESUR MODERATE (A) 04/15/2017  1259   Sepsis Labs: !!!!!!!!!!!!!!!!!!!!!!!!!!!!!!!!!!!!!!!!!!!! @LABRCNTIP (procalcitonin:4,lacticidven:4) )No results found for this or any previous visit (from the past 240 hour(s)).   Radiological Exams on Admission: Dg Chest 2 View  Result Date: 04/15/2017 CLINICAL DATA:  Weakness and shortness of breath. EXAM: CHEST  2 VIEW COMPARISON:  01/17/2016 FINDINGS: AP and lateral views of the chest were obtained. Hyperexpansion consistent with emphysema. Interstitial markings are diffusely coarsened with chronic features. Nodular densities are identified at the left lung base. The cardio pericardial silhouette is enlarged. Bones are diffusely demineralized. IMPRESSION: 1. Emphysema with adjacent nodular opacities at the right lung base. CT chest without contrast recommended to further evaluate. 2. Cardiomegaly. Electronically Signed   By: Misty Stanley M.D.   On: 04/15/2017 13:24   Ct Head Wo Contrast  Result Date: 04/15/2017 CLINICAL DATA:  83 year old female with legs feeling weak this morning, difficulty ambulating. History subdural hematoma. Initial encounter. EXAM: CT HEAD WITHOUT CONTRAST TECHNIQUE: Contiguous axial images were obtained from the base of the skull through the vertex without intravenous contrast. COMPARISON:  07/27/2015 head CT. FINDINGS: Brain: No intracranial hemorrhage or CT evidence of large acute infarct. Mild global atrophy without hydrocephalus. Mild chronic microvascular changes. No intracranial mass lesion noted on this unenhanced exam. Pituitary gland top-normal for age. Vascular: Vascular calcifications. Skull: Remote right frontal burr-hole procedure is. Sinuses/Orbits: No acute orbital abnormality. Partial opacification left sphenoid sinus. Other: Mastoid air cells and middle ear cavities are clear. IMPRESSION: No acute intracranial abnormality. Mild global atrophy. Mild chronic microvascular changes. Partial opacification left sphenoid sinus. Prior right frontal burr-hole  procedure for subdural hematoma drainage per history. Electronically Signed   By: Genia Del M.D.   On: 04/15/2017 14:23    EKG: Independently reviewed. Sinus rhythm, left bundle branch block unchanged from before.  Assessment/Plan Active Problems:   NSTEMI (non-ST elevated myocardial infarction) ([place])  # Non-ST elevated MI: Patient with generalized weakness, positive troponin and chest pain yesterday. History is unreliable because of underlying possible dementia. -As per ER physician, the consult was already discussed with her neurologist on call. The cardiologist recommended to admit patient at [facility], continue IV heparin for 48 hours, cycle troponin and echocardiogram in the morning. -Patient denied  chest pain currently. Echo ordered. Continue IV heparin. Monitor INR. Pharmacy consult appreciated. -Continue aspirin, Lipitor, metoprolol, verapamil. -Monitor heart rate, blood pressure. -Further management of MI defer to cardiologist. Inpatient cardiology consult requested.  #History of COPD with chronic respiratory failure with hypoxia: -Continue 2 L of oxygen -Continue bronchodilators and supportive care. -Monitor oxygen saturation.  #Hyperlipidemia: Continue statin.  #Chronic diastolic congestive heart failure: Does not look like fluid overload. Follow-up BNP level. Continue current cardiac medications. Follow-up echocardiogram.  #Hypokalemia: Replete potassium chloride. Monitor labs in the morning. Check magnesium level.  #Hypertension: Continue metoprolol, verapamil. Monitor blood pressure closely.  #Nodular opacity in the right lung base with elevated d-dimer and troponin: I will check CT chest to further evaluate.  # Generalized weakness likely in the setting of MI and likely physical deconditioning: Patient has no focal neurological deficit. Muscle strength 5 over 5 in all extremities and symmetric. CT scan of head with no acute finding. Patient will need PT OT  evaluation after evaluation/ treatment of MI.  DVT prophylaxis: On IV heparin Code Status: Full code currently Family Communication: No family at bedside in the ER Disposition Plan: Admit to stepdown Consults called: Cardiology Admission status: Inpatient   Toriano Aikey Tanna Furry MD [facility] Pager [phone]  If 7PM-7AM, please contact night-coverage [email or web address] Password [facility]  04/15/2017, 4:50 PM

## 2017-04-15 NOTE — ED Triage Notes (Addendum)
Patient from home via EMS with c/o weakness and shortness of breaths that started last night. Pt also c/o generalized weakness. Pt received 10 albuterol  neb treatment and 0.5 Atrovent. Pt use oxygen at 2.2 L

## 2017-04-15 NOTE — ED Provider Notes (Signed)
[facility] DEPT Provider Note   CSN: [id number] Arrival date & time: 04/15/17  1210     History   Chief Complaint Chief Complaint  Patient presents with  . weakness and SOB    HPI [name] is a 83 y.o. female.     Pt was seen at 1250. Per EMS and pt report, c/o gradual onset and persistence of constant generalized weakness and SOB that started last night. Pt states she was "unable to stand up" today due to feeling "too weak." EMS gave pt neb treatment en route with "some" improvement in SOB. Denies CP/palpitations, no cough, no abd pain, no N/V/D, no back pain, no focal motor weakness, no tingling/numbness in extremities, no fevers.    Past Medical History:  Diagnosis Date  . Cataract   . Chest pain   . COPD (chronic obstructive pulmonary disease) ([place])   . Debility 01/21/2016  . Hearing loss 01/21/2016  . Hyperglycemia 01/21/2016  . Hyperlipidemia   . Hypertension   . Left bundle branch block   . Macular degeneration 01/21/2016  . Major depression 01/29/2016  . Memory loss 01/21/2016  . Paroxysmal atrial fibrillation (HCC)   . Personal history of subdural hematoma   . Subdural hematoma ([place]) 2009  . Tobacco abuse     Patient Active Problem List   Diagnosis Date Noted  . Symptomatic anemia 07/01/2016  . Chronic diastolic CHF (congestive heart failure) ([place]) 07/01/2016  . GERD (gastroesophageal reflux disease) 02/12/2016  . Decubitus ulcer of sacral region, stage 2 01/29/2016  . Major depression 01/29/2016  . Hyperglycemia 01/21/2016  . Hearing loss 01/21/2016  . Macular degeneration 01/21/2016  . Memory loss 01/21/2016  . Anemia 01/21/2016  . Debility 01/21/2016  . Pressure ulcer 01/15/2016  . CAP (community acquired pneumonia) 01/14/2016  . COPD exacerbation ([place]) 01/14/2016  . Acute on chronic respiratory failure with hypoxia ([place]) 01/14/2016  . Leukocytosis 01/14/2016  . Community acquired pneumonia 01/14/2016  . Paroxysmal atrial fibrillation ([place])  07/30/2013  . Tobacco abuse 07/30/2013  . Hyperlipidemia 07/30/2013  . Essential hypertension 07/30/2013  . Family history of heart disease 07/30/2013  . Dyspnea on exertion 07/30/2013  . COPD (chronic obstructive pulmonary disease) ([place]) 04/06/2012    Past Surgical History:  Procedure Laterality Date  . ABDOMINAL HYSTERECTOMY    . APPENDECTOMY    . BRAIN SURGERY    . EYE SURGERY      OB History    No data available       Home Medications    Prior to Admission medications   Medication Sig Start Date End Date Taking? Authorizing Provider  acetaminophen (TYLENOL) 325 MG tablet Take 2 tablets (650 mg total) by mouth every 6 (six) hours as needed for mild pain (or Fever >/= 101). 07/04/16  Yes Robbie Lis, MD  albuterol (PROVENTIL HFA;VENTOLIN HFA) 108 (90 BASE) MCG/ACT inhaler Inhale 2 puffs into the lungs every 6 (six) hours as needed for wheezing or shortness of breath.    Yes [provider]  albuterol (PROVENTIL) (2.5 MG/3ML) 0.083% nebulizer solution Take 2.5 mg by nebulization 4 (four) times daily.   Yes [provider]  aspirin EC 81 MG tablet Take 81 mg by mouth daily.   Yes [provider]  atorvastatin (LIPITOR) 40 MG tablet Take 40 mg by mouth daily at 6 PM. Every other day   Yes [provider]  budesonide (PULMICORT) 0.5 MG/2ML nebulizer solution Take 0.5 mg by nebulization 2 (two) times daily.  Yes [provider]  diazepam (VALIUM) 5 MG tablet Take 1 tablet (5 mg total) by mouth daily as needed for anxiety. 07/04/16  Yes Robbie Lis, MD  ferrous sulfate 325 (65 FE) MG tablet Take 1 tablet (325 mg total) by mouth daily with breakfast. 07/05/16  Yes Robbie Lis, MD  fish oil-omega-3 fatty acids 1000 MG capsule Take 1 g by mouth 2 (two) times daily.    Yes [provider]  furosemide (LASIX) 20 MG tablet TAKE 1 TABLET (20 MG TOTAL) BY MOUTH DAILY AS NEEDED FOR FLUID OR EDEMA. 02/03/17 05/04/17 Yes Lorretta Harp, MD  meclizine (ANTIVERT) 12.5 MG tablet Take 1 tablet (12.5 mg total) by mouth 3 (three) times daily. Patient taking differently: Take 12.5 mg by mouth 3 (three) times daily as needed for dizziness.  07/27/15  Yes Carmin Muskrat, MD  metoprolol succinate (TOPROL-XL) 50 MG 24 hr tablet Take 1 tablet (50 mg total) by mouth daily. Take with or immediately following a meal. 01/18/16  Yes Charlynne Cousins, MD  Multiple Vitamin (MULTIVITAMIN WITH MINERALS) TABS tablet Take 1 tablet by mouth daily.   Yes [provider]  OXYGEN Inhale 2 L into the lungs at bedtime. Reported on 12/04/2015   Yes [provider]  verapamil (COVERA HS) 240 MG (CO) 24 hr tablet Take 240 mg by mouth daily at 12 noon. noon   Yes [provider]    Family History Family History  Problem Relation Age of Onset  . Diabetes Mother   . Heart disease Mother   . Heart disease Father     Social History Social History  Substance Use Topics  . Smoking status: Current Every Day Smoker    Packs/day: 1.00    Years: 73.00    Types: Cigarettes    Start date: 07/30/1945  . Smokeless tobacco: Never Used  . Alcohol use No     Allergies   Cefuroxime   Review of Systems Review of Systems ROS: Statement: All systems negative except as marked or noted in the HPI; Constitutional: Negative for fever and chills. +generalized weakness.; ; Eyes: Negative for eye pain, redness and discharge. ; ; ENMT: Negative for ear pain, hoarseness, nasal congestion, sinus pressure and sore throat. ; ; Cardiovascular: Negative for chest pain, palpitations, diaphoresis, and peripheral edema. ; ; Respiratory: +SOB. Negative for cough, wheezing and stridor. ; ; Gastrointestinal: Negative for nausea, vomiting, diarrhea, abdominal pain, blood in stool, hematemesis, jaundice and rectal bleeding. . ; ; Genitourinary: Negative for dysuria, flank pain and hematuria. ; ; Musculoskeletal: Negative for back pain and neck pain.  Negative for swelling and trauma.; ; Skin: Negative for pruritus, rash, abrasions, blisters, bruising and skin lesion.; ; Neuro: Negative for headache, lightheadedness and neck stiffness. Negative for altered level of consciousness, altered mental status, extremity weakness, paresthesias, involuntary movement, seizure and syncope.       Physical Exam Updated Vital Signs BP 116/77 (BP Location: Right Arm)   Pulse 78   Temp 98 F (36.7 C) (Oral)   Resp 19   SpO2 92%   Physical Exam 1255: Physical examination:  Nursing notes reviewed; Vital signs and O2 SAT reviewed;  Constitutional: Well developed, Well nourished, Well hydrated, In no acute distress; Head:  Normocephalic, atraumatic; Eyes: EOMI, PERRL, No scleral icterus; ENMT: Mouth and pharynx normal, Mucous membranes moist; Neck: Supple, Full range of motion, No lymphadenopathy; Cardiovascular: Regular rate and rhythm, No gallop; Respiratory: Breath sounds coarse & equal bilaterally,  No wheezes.  Speaking full sentences with ease, Normal respiratory effort/excursion; Chest: Nontender, Movement normal; Abdomen: Soft, Nontender, Nondistended, Normal bowel sounds; Genitourinary: No CVA tenderness; Spine:  No midline CS, TS, LS tenderness.;; Extremities: Pulses normal, No tenderness, No edema, No calf edema or asymmetry.; Neuro: AA&Ox3, vague historian. Major CN grossly intact. Speech clear.  No facial droop.  No nystagmus. Grips equal. Strength 5/5 equal bilat UE's and LE's.  DTR 2/4 equal bilat UE's and LE's.  No gross sensory deficits.  Normal cerebellar testing bilat UE's (finger-nose) and LE's (heel-shin).; Skin: Color normal, Warm, Dry.   ED Treatments / Results  Labs (all labs ordered are listed, but only abnormal results are displayed)   EKG  EKG Interpretation  Date/Time:  Tuesday April 15 2017 13:20:27 EDT Ventricular Rate:  86 PR Interval:    QRS Duration: 138 QT Interval:  439 QTC Calculation: 526 R Axis:   -7 Text  Interpretation:  Sinus rhythm Left axis deviation Left bundle branch block When compared with ECG of 01/14/2016 No significant change was found Confirmed by [facility]  MD, Nunzio Cory [phone]) on 04/15/2017 2:45:25 PM       Radiology   Procedures Procedures (including critical care time)  Medications Ordered in ED Medications  aspirin chewable tablet 324 mg (not administered)     Initial Impression / Assessment and Plan / ED Course  I have reviewed the triage vital signs and the nursing notes.  Pertinent labs & imaging results that were available during my care of the patient were reviewed by me and considered in my medical decision making (see chart for details).   MDM Reviewed: previous chart, nursing note and vitals Reviewed previous: labs and ECG Interpretation: labs, ECG, x-ray and CT scan Total time providing critical care: 30-74 minutes. This excludes time spent performing separately reportable procedures and services. Consults: cardiology and admitting MD   CRITICAL CARE Performed by: Alfonzo Feller Total critical care time: 35 minutes Critical care time was exclusive of separately billable procedures and treating other patients. Critical care was necessary to treat or prevent imminent or life-threatening deterioration. Critical care was time spent personally by me on the following activities: development of treatment plan with patient and/or surrogate as well as nursing, discussions with consultants, evaluation of patient's response to treatment, examination of patient, obtaining history from patient or surrogate, ordering and performing treatments and interventions, ordering and review of laboratory studies, ordering and review of radiographic studies, pulse oximetry and re-evaluation of patient's condition.   Results for orders placed or performed during the hospital encounter of 04/15/17  Urinalysis, Routine w reflex microscopic  Result Value Ref Range   Color, Urine  YELLOW YELLOW   APPearance HAZY (A) CLEAR   Specific Gravity, Urine 1.009 1.005 - 1.030   pH 6.0 5.0 - 8.0   Glucose, UA NEGATIVE NEGATIVE mg/dL   Hgb urine dipstick NEGATIVE NEGATIVE   Bilirubin Urine NEGATIVE NEGATIVE   Ketones, ur NEGATIVE NEGATIVE mg/dL   Protein, ur NEGATIVE NEGATIVE mg/dL   Nitrite NEGATIVE NEGATIVE   Leukocytes, UA MODERATE (A) NEGATIVE   RBC / HPF 0-5 0 - 5 RBC/hpf   WBC, UA TOO NUMEROUS TO COUNT 0 - 5 WBC/hpf   Bacteria, UA FEW (A) NONE SEEN   Squamous Epithelial / LPF 0-5 (A) NONE SEEN   Mucous PRESENT    Hyaline Casts, UA PRESENT   Comprehensive metabolic panel  Result Value Ref Range   Sodium 145 135 - 145 mmol/L   Potassium 3.3 (  L) 3.5 - 5.1 mmol/L   Chloride 98 (L) 101 - 111 mmol/L   CO2 42 (H) 22 - 32 mmol/L   Glucose, Bld 142 (H) 65 - 99 mg/dL   BUN 17 6 - 20 mg/dL   Creatinine, Ser 0.58 0.44 - 1.00 mg/dL   Calcium 8.8 (L) 8.9 - 10.3 mg/dL   Total Protein 7.1 6.5 - 8.1 g/dL   Albumin 3.7 3.5 - 5.0 g/dL   AST 20 15 - 41 U/L   ALT 14 14 - 54 U/L   Alkaline Phosphatase 73 38 - 126 U/L   Total Bilirubin 0.2 (L) 0.3 - 1.2 mg/dL   GFR calc non Af Amer >60 >60 mL/min   GFR calc Af Amer >60 >60 mL/min   Anion gap 5 5 - 15  Troponin I  Result Value Ref Range   Troponin I 3.07 (HH) <0.03 ng/mL  Lactic acid, plasma  Result Value Ref Range   Lactic Acid, Venous 1.6 0.5 - 1.9 mmol/L  CBC with Differential  Result Value Ref Range   WBC 8.3 4.0 - 10.5 K/uL   RBC 3.39 (L) 3.87 - 5.11 MIL/uL   Hemoglobin 9.1 (L) 12.0 - 15.0 g/dL   HCT 32.6 (L) 36.0 - 46.0 %   MCV 96.2 78.0 - 100.0 fL   MCH 26.8 26.0 - 34.0 pg   MCHC 27.9 (L) 30.0 - 36.0 g/dL   RDW 14.5 11.5 - 15.5 %   Platelets 309 150 - 400 K/uL   Neutrophils Relative % 67 %   Neutro Abs 5.6 1.7 - 7.7 K/uL   Lymphocytes Relative 19 %   Lymphs Abs 1.6 0.7 - 4.0 K/uL   Monocytes Relative 10 %   Monocytes Absolute 0.8 0.1 - 1.0 K/uL   Eosinophils Relative 4 %   Eosinophils Absolute 0.3 0.0 -  0.7 K/uL   Basophils Relative 0 %   Basophils Absolute 0.0 0.0 - 0.1 K/uL   Dg Chest 2 View Result Date: 04/15/2017 CLINICAL DATA:  Weakness and shortness of breath. EXAM: CHEST  2 VIEW COMPARISON:  01/17/2016 FINDINGS: AP and lateral views of the chest were obtained. Hyperexpansion consistent with emphysema. Interstitial markings are diffusely coarsened with chronic features. Nodular densities are identified at the left lung base. The cardio pericardial silhouette is enlarged. Bones are diffusely demineralized. IMPRESSION: 1. Emphysema with adjacent nodular opacities at the right lung base. CT chest without contrast recommended to further evaluate. 2. Cardiomegaly. Electronically Signed   By: Misty Stanley M.D.   On: 04/15/2017 13:24   Ct Head Wo Contrast Result Date: 04/15/2017 CLINICAL DATA:  83 year old female with legs feeling weak this morning, difficulty ambulating. History subdural hematoma. Initial encounter. EXAM: CT HEAD WITHOUT CONTRAST TECHNIQUE: Contiguous axial images were obtained from the base of the skull through the vertex without intravenous contrast. COMPARISON:  07/27/2015 head CT. FINDINGS: Brain: No intracranial hemorrhage or CT evidence of large acute infarct. Mild global atrophy without hydrocephalus. Mild chronic microvascular changes. No intracranial mass lesion noted on this unenhanced exam. Pituitary gland top-normal for age. Vascular: Vascular calcifications. Skull: Remote right frontal burr-hole procedure is. Sinuses/Orbits: No acute orbital abnormality. Partial opacification left sphenoid sinus. Other: Mastoid air cells and middle ear cavities are clear. IMPRESSION: No acute intracranial abnormality. Mild global atrophy. Mild chronic microvascular changes. Partial opacification left sphenoid sinus. Prior right frontal burr-hole procedure for subdural hematoma drainage per history. Electronically Signed   By: Genia Del M.D.   On: 04/15/2017 14:23  1500:  Potassium  repleted PO. 2nd short neb given for coarse lung sounds. Pt denies CP; though now states she had "chest soreness all day yesterday." States the "soreness" was located on the left side of her chest, she "thinks it went way when I took my nap." Denies chest "soreness" today.  Pt very unclear regarding her symptoms despite repeated questioning by myself and ED RN. T/C to [facility] Dr. Acie Fredrickson, case discussed, including:  HPI, pertinent PM/SHx, VS/PE, dx testing, ED course and treatment:  Agreeable to consult tomorrow, no emergent intervention at this time, requests to order d-dimer, start IV heparin x48 hours, admit to [facility] to cycle troponins and obtain Echo.   1615:  Age adjusted d-dimer appropriate for pt age.  Sats 99% on pt's usual O2 N/C. T/C to [facility] Dr. Carolin Sicks, case discussed, including:  HPI, pertinent PM/SHx, VS/PE, dx testing, ED course and treatment:  Agreeable to admit.   Final Clinical Impressions(s) / ED Diagnoses   Final diagnoses:  None    New Prescriptions New Prescriptions   No medications on file      Francine Graven, DO 04/18/17 [id number]

## 2017-04-15 NOTE — ED Notes (Signed)
Patient transported to X-ray 

## 2017-04-15 NOTE — ED Notes (Signed)
Bed: WA24 Expected date:  Expected time:  Means of arrival:  Comments: EMS-SOB 

## 2017-04-16 ENCOUNTER — Inpatient Hospital Stay (HOSPITAL_COMMUNITY): Payer: Medicare Other

## 2017-04-16 DIAGNOSIS — I5030 Unspecified diastolic (congestive) heart failure: Secondary | ICD-10-CM

## 2017-04-16 DIAGNOSIS — I214 Non-ST elevation (NSTEMI) myocardial infarction: Principal | ICD-10-CM

## 2017-04-16 LAB — CBC
HCT: 28.7 % — ABNORMAL LOW (ref 36.0–46.0)
HEMOGLOBIN: 8.5 g/dL — AB (ref 12.0–15.0)
MCH: 27.7 pg (ref 26.0–34.0)
MCHC: 29.6 g/dL — ABNORMAL LOW (ref 30.0–36.0)
MCV: 93.5 fL (ref 78.0–100.0)
Platelets: 281 10*3/uL (ref 150–400)
RBC: 3.07 MIL/uL — AB (ref 3.87–5.11)
RDW: 14.7 % (ref 11.5–15.5)
WBC: 8.9 10*3/uL (ref 4.0–10.5)

## 2017-04-16 LAB — URINE CULTURE

## 2017-04-16 LAB — BASIC METABOLIC PANEL
Anion gap: 8 (ref 5–15)
BUN: 16 mg/dL (ref 6–20)
CHLORIDE: 99 mmol/L — AB (ref 101–111)
CO2: 34 mmol/L — AB (ref 22–32)
CREATININE: 0.53 mg/dL (ref 0.44–1.00)
Calcium: 8.9 mg/dL (ref 8.9–10.3)
GFR calc Af Amer: >60 mL/min (ref >60)
GFR calc non Af Amer: >60 mL/min (ref >60)
Glucose, Bld: 94 mg/dL (ref 65–99)
Potassium: 4.3 mmol/L (ref 3.5–5.1)
Sodium: 141 mmol/L (ref 135–145)

## 2017-04-16 LAB — ECHOCARDIOGRAM COMPLETE
Height: 67 in
WEIGHTICAEL: 2391.55 [oz_av]

## 2017-04-16 LAB — LIPID PANEL
Cholesterol: 84 mg/dL (ref 0–200)
HDL: 39 mg/dL — ABNORMAL LOW (ref >40)
LDL CALC: 32 mg/dL (ref 0–99)
TRIGLYCERIDES: 63 mg/dL (ref <150)
Total CHOL/HDL Ratio: 2.2 RATIO
VLDL: 13 mg/dL (ref 0–40)

## 2017-04-16 LAB — HEPARIN LEVEL (UNFRACTIONATED)
HEPARIN UNFRACTIONATED: 0.38 [IU]/mL (ref 0.30–0.70)
HEPARIN UNFRACTIONATED: 0.36 [IU]/mL (ref 0.30–0.70)

## 2017-04-16 LAB — TROPONIN I: Troponin I: 2.86 ng/mL (ref <0.03)

## 2017-04-16 LAB — MAGNESIUM: Magnesium: 1.9 mg/dL (ref 1.7–2.4)

## 2017-04-16 MED ORDER — LORAZEPAM 2 MG/ML IJ SOLN
1.0000 mg | Freq: Once | INTRAMUSCULAR | Status: AC
  Administered 2017-04-16: 1 mg via INTRAMUSCULAR
  Filled 2017-04-16: qty 1

## 2017-04-16 NOTE — Progress Notes (Deleted)
ANTICOAGULATION CONSULT NOTE - Follow Up Consult  Pharmacy Consult for heparin Indication: chest pain/ACS  Allergies  Allergen Reactions  . Cefuroxime Nausea And Vomiting    Patient Measurements: Height: 5\' 7"  (170.2 cm) Weight: 149 lb 7.6 oz (67.8 kg) IBW/kg (Calculated) : 61.6 Heparin Dosing Weight: actual body weight  Vital Signs: Temp: 98.1 F (36.7 C) (06/27 1038) Temp Source: Oral (06/27 1038) BP: 163/65 (06/27 1038) Pulse Rate: 76 (06/27 1038)  Labs:  Recent Labs  04/15/17 1337 04/15/17 1517 04/15/17 2010 04/15/17 2148 04/16/17 0044 04/16/17 0735  HGB 9.1*  --   --   --   --  8.5*  HCT 32.6*  --   --   --   --  28.7*  PLT 309  --   --   --   --  281  APTT  --  34  --   --   --   --   LABPROT  --  13.5  --   --   --   --   INR  --  1.02  --   --   --   --   HEPARINUNFRC  --   --   --   --  0.36 0.38  CREATININE 0.58  --   --   --   --  0.53  TROPONINI 3.07*  --  2.82* 2.65*  --  2.86*    Estimated Creatinine Clearance: 44.5 mL/min (by C-G formula based on SCr of 0.53 mg/dL).   Medical History: Past Medical History:  Diagnosis Date  . Cataract   . Chest pain   . COPD (chronic obstructive pulmonary disease) ([place])   . Debility 01/21/2016  . Hearing loss 01/21/2016  . Hyperglycemia 01/21/2016  . Hyperlipidemia   . Hypertension   . Left bundle branch block   . Macular degeneration 01/21/2016  . Major depression 01/29/2016  . Memory loss 01/21/2016  . Paroxysmal atrial fibrillation (HCC)   . Personal history of subdural hematoma   . Subdural hematoma ([place]) 2009  . Tobacco abuse     Assessment: 40 y/oF who [facility] ED on 04/15/2017 with generalized weakness, SOB and chest soreness. Troponin elevated in ED at 3.07. D-dimer elevated at 0.89. CT of head with no acute intracranial abnormality. CTa of chest negative for PE, but shows possible malignancy and signs of significant three vessel disease. Pharmacy dosing heparin infusion.   Patient not on any  anticoagulants PTA. H/o PAF but not on anticoagulation due to advanced age per cardiology note. Baseline labs: Hgb 9.1, Pltc WNL, PT/INR 13.5/1.02, aPTT 34 seconds.   Today, 04/16/2017: - Heparin level 0.38 units/ml, second level remains therapeutic with infusion at 800 units/hr - Hgb low/stable. Platelets WNL. - Cardiology recommending continued medical management for now for NSTEMI  Goal of Therapy:  Heparin level 0.3-0.7 units/ml Monitor platelets by anticoagulation protocol: Yes   Plan:   Continue heparin infusion at 800 units/hr  Daily CBC and heparin level  Hershal Coria, PharmD, BCPS Pager: [phone] 04/16/2017 11:05 AM

## 2017-04-16 NOTE — Progress Notes (Signed)
  Echocardiogram 2D Echocardiogram has been performed.  [name] 04/16/2017, 2:14 PM

## 2017-04-16 NOTE — Progress Notes (Signed)
Brief Pharmacy Note re: Heparin  In brief, this is a 7 yoF on IV heparin infusion for medical mgmt of NSTEMI. See note by Lindell Spar, PharmD, for full details.  Assessment:  Heparin level = 0.36, therapeutic on heparin infusion at 800 units/hr  No bleeding or infusion issues reported per nursing  Plan:   Continue heparin infusion at 800 units/hr.  Daily CBC, heparin level  Monitor for s/sx of bleeding.  Netta Cedars, PharmD, BCPS Pager: [phone] 04/16/2017@1 :32 AM

## 2017-04-16 NOTE — Consult Note (Signed)
Cardiology Consultation:   Patient ID: [name]; [id number]; 1924-04-21   Admit date: 04/15/2017 Date of Consult: 04/16/2017  Primary Care Provider: Merrilee Seashore, MD Primary Cardiologist: Dr. Gwenlyn Found Primary Electrophysiologist:  None   Patient Profile:   [name] is a 83 y.o. female with a hx of dementia, tobacco abuse, COPD-- chronic hypoxic resp failure on 2 L of 02,  hypertension, hyperlipidemia, chronic diastolic CHF, LBBB, PAF-- not on anticoagulation due to advanced age, brother had bypass surgery and father died of sudden cardiac death, no cardiac hx or stroke.   We have been asked to see today for the evaluation of positive Troponin and chest pain at the request of Dr. Carolin Sicks.  History of Present Illness:   [name] see's Dr. Quay Burow, last seen in the office by Almyra Deforest, PA-C November 2017 after a fall with some lower extremity swelling. Overall was doing well at that time. She lives with her son at home.  She presented to the hospital yesterday for weakness and not feeling well. She had chest pain on Monday but no pain yesterday or today. On arrival to the ER she was short of breath, had multiple breathing treatments with some improvement. She had low potassium which was replaced. Cardiology, Dr. Acie Fredrickson, was consulted and recommend starting IV heparin, obtaining d-dimer, cycle troponins, and admission to medicine. D-dimer came back elevated therefore CT angio chest was done. The results showed no pulmonary embolism, CT showed pulmonary nodules concerning for primary pulmonary malignancy, severe COPD, moderate cardiomegaly-- extensive three vessel coronary artery atherosclerosis as well as severe thoracic and upper abdominal aortic atherosclerosis without aneurysm.  BNP mildly elevated at 147. Her initial troponin has resulted at 3.07, this has mostly remained flat 3.07 -- 2.82--2.65 -- 2.86.  EKG SR, LBBB, no acute ischemic changes.  She still having  some dizziness, fatigue has improved as well. Denies CP at this time but also doesn't remember having CP in general for over 5 years. Echocardiogram is pending. Pt would like to eat food.  Past Medical History:  Diagnosis Date  . Cataract   . Chest pain   . COPD (chronic obstructive pulmonary disease) ([place])   . Debility 01/21/2016  . Hearing loss 01/21/2016  . Hyperglycemia 01/21/2016  . Hyperlipidemia   . Hypertension   . Left bundle branch block   . Macular degeneration 01/21/2016  . Major depression 01/29/2016  . Memory loss 01/21/2016  . Paroxysmal atrial fibrillation ([facility])   . Personal history of subdural hematoma   . Subdural hematoma ([place]) 2009  . Tobacco abuse     Past Surgical History:  Procedure Laterality Date  . ABDOMINAL HYSTERECTOMY    . APPENDECTOMY    . BRAIN SURGERY    . EYE SURGERY       Inpatient Medications: Scheduled Meds: . aspirin EC  81 mg Oral Daily  . atorvastatin  40 mg Oral q1800  . budesonide  0.5 mg Nebulization BID  . ferrous sulfate  325 mg Oral Q breakfast  . ipratropium-albuterol  3 mL Nebulization QID  . metoprolol succinate  50 mg Oral Daily  . multivitamin with minerals  1 tablet Oral Daily  . potassium chloride SA  40 mEq Oral Daily  . verapamil  240 mg Oral Q1200   Continuous Infusions: . heparin 800 Units/hr (04/16/17 0730)   PRN Meds: acetaminophen, diazepam, nitroGLYCERIN, ondansetron (ZOFRAN) IV  Allergies:    Allergies  Allergen Reactions  . Cefuroxime Nausea And  Vomiting    Social History:   Social History   Social History  . Marital status: Divorced    Spouse name: N/A  . Number of children: N/A  . Years of education: N/A   Occupational History  . retired    Social History Main Topics  . Smoking status: Current Every Day Smoker    Packs/day: 1.00    Years: 73.00    Types: Cigarettes    Start date: 07/30/1945  . Smokeless tobacco: Never Used  . Alcohol use No  . Drug use: No  . Sexual activity: Not on file    Other Topics Concern  . Not on file   Social History Narrative   Lives with her son.   Home oxygen.   Ambulates with a rolling walker.    Family History:   The patient's family history includes Diabetes in her mother; Heart disease in her father and mother.  ROS:  Please see the history of present illness.  Unable due to dementia.     Physical Exam/Data:   Vitals:   04/16/17 0720 04/16/17 0800 04/16/17 0841 04/16/17 0934  BP:  (!) 163/56 (!) 163/56 (!) 129/117  Pulse:  70 81 78  Resp:  20 15 (!) 22  Temp:      TempSrc:      SpO2: 95% 94% 100% 96%  Weight:      Height:       No intake or output data in the 24 hours ending 04/16/17 1004 Filed Weights   04/15/17 1535  Weight: 150 lb (68 kg)   Body mass index is 23.49 kg/m.  General: Well developed, well nourished, in no acute distress. Head: Normocephalic, atraumatic, sclera non-icteric, no xanthomas, nares are without discharge.  Neck: Negative for carotid bruits. JVD not elevated. Lungs: Clear bilaterally to auscultation without wheezes, rales, or rhonchi. Breathing is unlabored. Heart: RRR with S1 S2. No murmurs, rubs, or gallops appreciated. Abdomen: Soft, non-tender, non-distended with normoactive bowel sounds. No hepatomegaly. No rebound/guarding. No obvious abdominal masses. Msk:  Strength and tone appear normal for age. Extremities: No clubbing or cyanosis. No edema.  Distal pedal pulses are 2+ and equal bilaterally. Neuro: Alert and oriented X 3. No facial asymmetry. No focal deficit. Moves all extremities spontaneously. Psych:  Responds to questions appropriately with a normal affect.  EKG:  The EKG was personally reviewed and demonstrates multiple PVCs, SR  Relevant CV Studies:  Echo 08/16/2013 LV EF: 60% -  65%  ------------------------------------------------------------ Indications:   786.05 Dyspnea.  ------------------------------------------------------------ History:  PMH:  Hyperlipidemia Atrial fibrillation. Chronic obstructive pulmonary disease.  ------------------------------------------------------------ Study Conclusions  - Left ventricle: The cavity size was normal. There was moderate concentric hypertrophy with severe septal hypertrophy (1.7 cm) and associated SAM of the mitral valve, suggesting HOCM. The outflow tract gradient, however, is minimal at 5-8 mmHg at rest (valsalva not performed). Systolic function was normal. The estimated ejection fraction was in the range of 60% to 65%. Doppler parameters are consistent with abnormal left ventricular relaxation (grade 1 diastolic dysfunction). The E/e' ratio is >10, suggesting elevated LV filling pressure. - Mitral valve: Mildly thickened leaflets- SAM is noted. Mild regurgitation. - Left atrium: LA Volume/ BSA = 20.6 ml/m2 The atrium was normal in size. - Systemic veins: The IVC is small (<1.2 cm) and collapses spontaneously, suggesting volume depletion and a low RA pressure of 2 mmHg. Impressions:  - Dypnea could be related to low preload in the setting of dynamic LVOT obstruction -  clinical correlation is advised.   Myoview 08/10/2013 Impression Exercise Capacity: Lexiscan with no exercise. BP Response: Normal blood pressure response. Clinical Symptoms: No symptoms. ECG Impression: No significant ECG changes with Lexiscan. Comparison with Prior Nuclear Study: No previous nuclear study performed   Overall Impression:Normal stress nuclear study.    Echo 08/15/2016 LV EF: 60% - 65%  Study Conclusions  - Left ventricle: The cavity size was normal. There was moderate concentric hypertrophy. Systolic function was normal. The estimated ejection fraction was in the range of 60% to 65%. Wall motion was normal; there were no regional wall motion abnormalities. There was an increased relative contribution of atrial contraction  to ventricular filling. Features are consistent with a pseudonormal left ventricular filling pattern, with concomitant abnormal relaxation and increased filling pressure (grade 2 diastolic dysfunction). - Aortic valve: Trileaflet; mildly thickened, mildly calcified leaflets. - Mitral valve: There was trivial regurgitation. - Atrial septum: There was increased thickness of the septum, consistent with lipomatous hypertrophy.    Laboratory Data:  Chemistry Recent Labs Lab 04/15/17 1337 04/16/17 0735  NA 145 141  K 3.3* 4.3  CL 98* 99*  CO2 42* 34*  GLUCOSE 142* 94  BUN 17 16  CREATININE 0.58 0.53  CALCIUM 8.8* 8.9  GFRNONAA >60 >60  GFRAA >60 >60  ANIONGAP 5 8     Recent Labs Lab 04/15/17 1337  PROT 7.1  ALBUMIN 3.7  AST 20  ALT 14  ALKPHOS 73  BILITOT 0.2*   Hematology Recent Labs Lab 04/15/17 1337 04/16/17 0735  WBC 8.3 8.9  RBC 3.39* 3.07*  HGB 9.1* 8.5*  HCT 32.6* 28.7*  MCV 96.2 93.5  MCH 26.8 27.7  MCHC 27.9* 29.6*  RDW 14.5 14.7  PLT 309 281   Cardiac Enzymes Recent Labs Lab 04/15/17 1337 04/15/17 2010 04/15/17 2148 04/16/17 0735  TROPONINI 3.07* 2.82* 2.65* 2.86*   No results for input(s): TROPIPOC in the last 168 hours.  BNP Recent Labs Lab 04/15/17 1337  BNP 147.1*    DDimer  Recent Labs Lab 04/15/17 1502  DDIMER 0.89*    Radiology/Studies:  Dg Chest 2 View  Result Date: 04/15/2017 CLINICAL DATA:  Weakness and shortness of breath. EXAM: CHEST  2 VIEW COMPARISON:  01/17/2016 FINDINGS: AP and lateral views of the chest were obtained. Hyperexpansion consistent with emphysema. Interstitial markings are diffusely coarsened with chronic features. Nodular densities are identified at the left lung base. The cardio pericardial silhouette is enlarged. Bones are diffusely demineralized. IMPRESSION: 1. Emphysema with adjacent nodular opacities at the right lung base. CT chest without contrast recommended to further evaluate. 2.  Cardiomegaly. Electronically Signed   By: Misty Stanley M.D.   On: 04/15/2017 13:24   Ct Head Wo Contrast  Result Date: 04/15/2017 CLINICAL DATA:  83 year old female with legs feeling weak this morning, difficulty ambulating. History subdural hematoma. Initial encounter. EXAM: CT HEAD WITHOUT CONTRAST TECHNIQUE: Contiguous axial images were obtained from the base of the skull through the vertex without intravenous contrast. COMPARISON:  07/27/2015 head CT. FINDINGS: Brain: No intracranial hemorrhage or CT evidence of large acute infarct. Mild global atrophy without hydrocephalus. Mild chronic microvascular changes. No intracranial mass lesion noted on this unenhanced exam. Pituitary gland top-normal for age. Vascular: Vascular calcifications. Skull: Remote right frontal burr-hole procedure is. Sinuses/Orbits: No acute orbital abnormality. Partial opacification left sphenoid sinus. Other: Mastoid air cells and middle ear cavities are clear. IMPRESSION: No acute intracranial abnormality. Mild global atrophy. Mild chronic microvascular changes. Partial opacification left sphenoid  sinus. Prior right frontal burr-hole procedure for subdural hematoma drainage per history. Electronically Signed   By: Genia Del M.D.   On: 04/15/2017 14:23   Ct Angio Chest Pe W Or Wo Contrast  Result Date: 04/15/2017 CLINICAL DATA:  83 year old acute onset of shortness of breath began last night, associated with generalized weakness. Possible left basilar lung nodules on chest x-ray earlier today. EXAM: CT ANGIOGRAPHY CHEST WITH CONTRAST TECHNIQUE: Multidetector CT imaging of the chest was performed using the standard protocol during bolus administration of intravenous contrast. Multiplanar CT image reconstructions and MIPs were obtained to evaluate the vascular anatomy. CONTRAST:  77 mL Isovue 370 IV. COMPARISON:  CTA chest 02/14/2005.  Multiple prior chest x-rays. FINDINGS: Cardiovascular: Contrast opacification of pulmonary  arteries is very good. No filling defects within either main pulmonary artery or their segmental branches in either lung to suggest pulmonary embolism. Cardiac silhouette moderately enlarged with left ventricular hypertrophy. Extensive three-vessel coronary atherosclerosis. Prominent epicardial fat. No pericardial effusion. Severe atherosclerosis involving the thoracic and upper abdominal aorta without evidence of aneurysm. Atherosclerosis involving the proximal great vessels without visible stenosis. Mediastinum/Nodes: Upper normal sized AP window mediastinal lymph node measuring 1.2 x 1.9 cm, unchanged since the 2006 examination. No pathologic lymphadenopathy. Normal appearing gas-filled esophagus. Multinodular goiter, with the largest nodule arising from the lower pole of the left lobe of the thyroid gland measuring approximately 2.4 cm, not significantly changed since 2006. Lungs/Pleura: Emphysematous changes throughout both lungs. Pleural based nodule with irregular margins in the lateral left lower lobe measuring approximately 1.5 x 1.2 cm (series 7, image 65) and adjacent subpleural nodule slightly anterior and inferior in the left lower lobe measuring approximately 0.9 x 0.7 cm (series 7, image 69), not present on the prior CT for on prior chest x-rays. No parenchymal nodules or masses elsewhere in either lung. No confluent airspace consolidation. No pleural effusions. Right apical pleuroparenchymal scarring. Fluid/mucous within multiple right lower lobe segmental and subsegmental bronchi. Remaining central airways patent with tracheobronchial cartilage calcification. Upper Abdomen: Subcentimeter cysts arising from the upper pole of the left kidney. Approximate 1.4 cm nodule arising from the left adrenal gland, not imaged on the prior CT. Visualized upper abdomen otherwise unremarkable. Musculoskeletal: Osseous demineralization. Degenerative disc disease and spondylosis throughout the thoracic spine. No  acute osseous abnormalities. Review of the MIP images confirms the above findings. IMPRESSION: 1. No evidence of pulmonary embolism. 2. Adjacent pleural based nodules in the lateral left lower lobe, the largest measuring 1.5 cm with irregular margins. Primary pulmonary malignancy is suspected. 3. Severe COPD/emphysema. Fluid/mucous plugging involving multiple segmental and subsegmental right lower lobe bronchi. No acute cardiopulmonary disease otherwise. 4. Moderate cardiomegaly. Extensive three-vessel coronary atherosclerosis. Severe thoracic and upper abdominal aortic atherosclerosis without aneurysm. 5. 1.4 cm nodule arising from the left adrenal gland, indeterminate. 6. Multinodular goiter, not significantly changed since 2006. Aortic Atherosclerosis (ICD10-I70.0) and Emphysema (ICD10-J43.9). Electronically Signed   By: Evangeline Dakin M.D.   On: 04/15/2017 18:19    Assessment and Plan:   1. NSTEMI: Patient is now pain free and not having reoccurring pain. Troponins are flat 3.07 -- 2.82--2.65 -- 2.86. Her CT angio shows possible malignancy but signs of significant three vessel disease. -- given her advanced age, flat troponins and cessation of her chest pain I think medical management is the best course of treatment for this patient. Will discuss with Dr. Acie Fredrickson. -- continue Aspirin, Metoprolol, and Verpamil, continue heparin for 48 hours. -- consider starting on daily Imdur,  per chart review patient complaints of chest pains often. -- echocardiogram is pending  2. PAF: Not on anticoagulation due to advanced age. HR 86, old LBBB, SR  - This patients CHA2DS2-VASc Score and unadjusted Ischemic Stroke Rate (% per year) is equal to 7.2 % stroke rate/year from a score of 5  Above score calculated as 1 point each if present [CHF, HTN, DM, Vascular=MI/PAD/Aortic Plaque, Age if 65-74, or Female] Above score calculated as 2 points each if present [Age > 75, or Stroke/TIA/TE]  3. Hypertension:  BP  initially poorly controlled with sBP readings at 160s. Pt has been in pain, sBP has now improved to 129.  4. HLD: On Lipitor 40 mg daily. No recent LDL in Epic   Signed, Jena Gauss  04/16/2017 10:04 AM   Attending Note:   The patient was seen and examined.  Agree with assessment and plan as noted above.  Changes made to the above note as needed.  Patient seen and independently examined with Delos Haring, PA .   We discussed all aspects of the encounter. I agree with the assessment and plan as stated above.  1.   NSTEMI:   Pt is 83 yo, has dementia. Cannot remember whether or not she has had CP  Has CT scan that showed significant 3 V coronary artery calcifications.  Is not having any pain at this point  Will allow her to eat - has been kept NPO since last night Echo to assess LV function  I've discussed with the patient and family.   She is not a candidate for cath , PCI or CABBG Conservative , medical therepy is indicated.   Will sign off Call for questions    I have spent a total of 40 minutes with patient reviewing hospital  notes , telemetry, EKGs, labs and examining patient as well as establishing an assessment and plan that was discussed with the patient. > 50% of time was spent in direct patient care.    Thayer Headings, Brooke Bonito., MD, [facility] 04/16/2017, 12:47 PM [address]  [place] Pager [phone]

## 2017-04-16 NOTE — Progress Notes (Signed)
PROGRESS NOTE    [name]  RAQ:[id number] DOB: 09-Jan-1924 DOA: 04/15/2017 PCP: Merrilee Seashore, MD   Brief Narrative: 83 y.o. female with medical history significant of COPD, chronic hypoxic respiratory failure on 2 L of oxygen, hypertension, dyslipidemia, left bundle branch block, history of subdural hematoma in 2009, smoking, paroxysmal atrial fibrillation not on systemic anticoagulation presented with generalized weakness and not feeling well. Patient is a poor historian. In the ER patient was found to have positive troponin of 3.07 with elevated d-dimer. Cardiology was consulted, IV heparin is started and admitted for further evaluation.  Assessment & Plan:   Active Problems:   NSTEMI (non-ST elevated myocardial infarction) ([place])  # Non-ST elevated MI: Patient with generalized weakness, positive troponin and chest pain yesterday. History is unreliable because of underlying possible dementia. -CT scan of chest showed possible 3 vessel disease. I discussed with the cardiologist today. Patient is not candidate for PCI or any cardiac intervention this time. Continue IV heparin for total 48 hours. Continue aspirin, Lipitor, metoprolol and verapamil. Patient denied chest pain today. -Echo was done today, follow up result.  #History of COPD with chronic respiratory failure with hypoxia: -Continue 2 L of oxygen -Continue bronchodilators and supportive care. -Monitor oxygen saturation.  #Hyperlipidemia: Continue statin.  #Chronic diastolic congestive heart failure: Does not look like fluid overload.  Continue current cardiac medications. Follow-up echocardiogram.  #Hypokalemia: Replete potassium chloride. Potassium level improved. Magnesium level acceptable.  #Hypertension: Continue metoprolol, verapamil. Monitor blood pressure closely.  #Nodular opacity in the right lung base with elevated d-dimer and troponin: CT scan of chest with no PE however consistent with left lower  lobe lung nodules. Recommended to follow-up with oncologist outpatient if patient and family wants to further explore. Patient with advanced dementia.  # Generalized weakness likely in the setting of MI and likely physical deconditioning: Patient has no focal neurological deficit. Muscle strength 5 over 5 in all extremities and symmetric. CT scan of head with no acute finding. Patient will need PT OT evaluation after evaluation/ treatment of MI.  #Possible advanced dementia without behavioral disturbance, likely Alzheimer's: Continue supportive care. Recommended to follow-up with PCP. DVT prophylaxis: On IV heparin Code Status: Full code Family Communication: No family at bedside Disposition Plan: Likely discharge home versus rehabilitation in 1-2 days    Consultants:   Cardiologist  Procedures: Echo pending Antimicrobials: None  Subjective: Seen and examined at bedside. Denied chest pain, shortness of breath, nausea and vomiting.  Objective: Vitals:   04/16/17 0841 04/16/17 0934 04/16/17 1038 04/16/17 1310  BP: (!) 163/56 (!) 129/117 (!) 163/65 (!) 149/54  Pulse: 81 78 76 72  Resp: 15 (!) 22 18 20   Temp:   98.1 F (36.7 C) 98.2 F (36.8 C)  TempSrc:   Oral Oral  SpO2: 100% 96% 100% 99%  Weight:   67.8 kg (149 lb 7.6 oz)   Height:   5\' 7"  (1.702 m)    No intake or output data in the 24 hours ending 04/16/17 1423 Filed Weights   04/15/17 1535 04/16/17 1038  Weight: 68 kg (150 lb) 67.8 kg (149 lb 7.6 oz)    Examination:  General exam: Confused elderly female sitting on bed comfortable  Respiratory system: Clear to auscultation. Respiratory effort normal. No wheezing or crackle Cardiovascular system: S1 & S2 heard, RRR.  No pedal edema. Gastrointestinal system: Abdomen is nondistended, soft and nontender. Normal bowel sounds heard. Central nervous system: Alert and oriented. No focal neurological deficits. Extremities: Symmetric  5 x 5 power. Skin: No rashes, lesions or  ulcers Psychiatry: Judgement and insight appear impaired    Data Reviewed: I have personally reviewed following labs and imaging studies  CBC:  Recent Labs Lab 04/15/17 1337 04/16/17 0735  WBC 8.3 8.9  NEUTROABS 5.6  --   HGB 9.1* 8.5*  HCT 32.6* 28.7*  MCV 96.2 93.5  PLT 309 474   Basic Metabolic Panel:  Recent Labs Lab 04/15/17 1337 04/16/17 0735  NA 145 141  K 3.3* 4.3  CL 98* 99*  CO2 42* 34*  GLUCOSE 142* 94  BUN 17 16  CREATININE 0.58 0.53  CALCIUM 8.8* 8.9  MG  --  1.9   GFR: Estimated Creatinine Clearance: 44.5 mL/min (by C-G formula based on SCr of 0.53 mg/dL). Liver Function Tests:  Recent Labs Lab 04/15/17 1337  AST 20  ALT 14  ALKPHOS 73  BILITOT 0.2*  PROT 7.1  ALBUMIN 3.7   No results for input(s): LIPASE, AMYLASE in the last 168 hours. No results for input(s): AMMONIA in the last 168 hours. Coagulation Profile:  Recent Labs Lab 04/15/17 1517  INR 1.02   Cardiac Enzymes:  Recent Labs Lab 04/15/17 1337 04/15/17 2010 04/15/17 2148 04/16/17 0735  TROPONINI 3.07* 2.82* 2.65* 2.86*   BNP (last 3 results) No results for input(s): PROBNP in the last 8760 hours. HbA1C: No results for input(s): HGBA1C in the last 72 hours. CBG: No results for input(s): GLUCAP in the last 168 hours. Lipid Profile:  Recent Labs  04/16/17 0735  CHOL 84  HDL 39*  LDLCALC 32  TRIG 63  CHOLHDL 2.2   Thyroid Function Tests: No results for input(s): TSH, T4TOTAL, FREET4, T3FREE, THYROIDAB in the last 72 hours. Anemia Panel: No results for input(s): VITAMINB12, FOLATE, FERRITIN, TIBC, IRON, RETICCTPCT in the last 72 hours. Sepsis Labs:  Recent Labs Lab 04/15/17 1337  LATICACIDVEN 1.6    Recent Results (from the past 240 hour(s))  Urine culture     Status: Abnormal   Collection Time: 04/15/17 12:59 PM  Result Value Ref Range Status   Specimen Description URINE, CLEAN CATCH  Final   Special Requests NONE  Final   Culture MULTIPLE  SPECIES PRESENT, SUGGEST RECOLLECTION (A)  Final   Report Status 04/16/2017 FINAL  Final         Radiology Studies: Dg Chest 2 View  Result Date: 04/15/2017 CLINICAL DATA:  Weakness and shortness of breath. EXAM: CHEST  2 VIEW COMPARISON:  01/17/2016 FINDINGS: AP and lateral views of the chest were obtained. Hyperexpansion consistent with emphysema. Interstitial markings are diffusely coarsened with chronic features. Nodular densities are identified at the left lung base. The cardio pericardial silhouette is enlarged. Bones are diffusely demineralized. IMPRESSION: 1. Emphysema with adjacent nodular opacities at the right lung base. CT chest without contrast recommended to further evaluate. 2. Cardiomegaly. Electronically Signed   By: Misty Stanley M.D.   On: 04/15/2017 13:24   Ct Head Wo Contrast  Result Date: 04/15/2017 CLINICAL DATA:  83 year old female with legs feeling weak this morning, difficulty ambulating. History subdural hematoma. Initial encounter. EXAM: CT HEAD WITHOUT CONTRAST TECHNIQUE: Contiguous axial images were obtained from the base of the skull through the vertex without intravenous contrast. COMPARISON:  07/27/2015 head CT. FINDINGS: Brain: No intracranial hemorrhage or CT evidence of large acute infarct. Mild global atrophy without hydrocephalus. Mild chronic microvascular changes. No intracranial mass lesion noted on this unenhanced exam. Pituitary gland top-normal for age. Vascular: Vascular calcifications. Skull:  Remote right frontal burr-hole procedure is. Sinuses/Orbits: No acute orbital abnormality. Partial opacification left sphenoid sinus. Other: Mastoid air cells and middle ear cavities are clear. IMPRESSION: No acute intracranial abnormality. Mild global atrophy. Mild chronic microvascular changes. Partial opacification left sphenoid sinus. Prior right frontal burr-hole procedure for subdural hematoma drainage per history. Electronically Signed   By: Genia Del M.D.    On: 04/15/2017 14:23   Ct Angio Chest Pe W Or Wo Contrast  Result Date: 04/15/2017 CLINICAL DATA:  83 year old acute onset of shortness of breath began last night, associated with generalized weakness. Possible left basilar lung nodules on chest x-ray earlier today. EXAM: CT ANGIOGRAPHY CHEST WITH CONTRAST TECHNIQUE: Multidetector CT imaging of the chest was performed using the standard protocol during bolus administration of intravenous contrast. Multiplanar CT image reconstructions and MIPs were obtained to evaluate the vascular anatomy. CONTRAST:  77 mL Isovue 370 IV. COMPARISON:  CTA chest 02/14/2005.  Multiple prior chest x-rays. FINDINGS: Cardiovascular: Contrast opacification of pulmonary arteries is very good. No filling defects within either main pulmonary artery or their segmental branches in either lung to suggest pulmonary embolism. Cardiac silhouette moderately enlarged with left ventricular hypertrophy. Extensive three-vessel coronary atherosclerosis. Prominent epicardial fat. No pericardial effusion. Severe atherosclerosis involving the thoracic and upper abdominal aorta without evidence of aneurysm. Atherosclerosis involving the proximal great vessels without visible stenosis. Mediastinum/Nodes: Upper normal sized AP window mediastinal lymph node measuring 1.2 x 1.9 cm, unchanged since the 2006 examination. No pathologic lymphadenopathy. Normal appearing gas-filled esophagus. Multinodular goiter, with the largest nodule arising from the lower pole of the left lobe of the thyroid gland measuring approximately 2.4 cm, not significantly changed since 2006. Lungs/Pleura: Emphysematous changes throughout both lungs. Pleural based nodule with irregular margins in the lateral left lower lobe measuring approximately 1.5 x 1.2 cm (series 7, image 65) and adjacent subpleural nodule slightly anterior and inferior in the left lower lobe measuring approximately 0.9 x 0.7 cm (series 7, image 69), not present  on the prior CT for on prior chest x-rays. No parenchymal nodules or masses elsewhere in either lung. No confluent airspace consolidation. No pleural effusions. Right apical pleuroparenchymal scarring. Fluid/mucous within multiple right lower lobe segmental and subsegmental bronchi. Remaining central airways patent with tracheobronchial cartilage calcification. Upper Abdomen: Subcentimeter cysts arising from the upper pole of the left kidney. Approximate 1.4 cm nodule arising from the left adrenal gland, not imaged on the prior CT. Visualized upper abdomen otherwise unremarkable. Musculoskeletal: Osseous demineralization. Degenerative disc disease and spondylosis throughout the thoracic spine. No acute osseous abnormalities. Review of the MIP images confirms the above findings. IMPRESSION: 1. No evidence of pulmonary embolism. 2. Adjacent pleural based nodules in the lateral left lower lobe, the largest measuring 1.5 cm with irregular margins. Primary pulmonary malignancy is suspected. 3. Severe COPD/emphysema. Fluid/mucous plugging involving multiple segmental and subsegmental right lower lobe bronchi. No acute cardiopulmonary disease otherwise. 4. Moderate cardiomegaly. Extensive three-vessel coronary atherosclerosis. Severe thoracic and upper abdominal aortic atherosclerosis without aneurysm. 5. 1.4 cm nodule arising from the left adrenal gland, indeterminate. 6. Multinodular goiter, not significantly changed since 2006. Aortic Atherosclerosis (ICD10-I70.0) and Emphysema (ICD10-J43.9). Electronically Signed   By: Evangeline Dakin M.D.   On: 04/15/2017 18:19        Scheduled Meds: . aspirin EC  81 mg Oral Daily  . atorvastatin  40 mg Oral q1800  . budesonide  0.5 mg Nebulization BID  . ferrous sulfate  325 mg Oral Q breakfast  . ipratropium-albuterol  3 mL Nebulization QID  . metoprolol succinate  50 mg Oral Daily  . multivitamin with minerals  1 tablet Oral Daily  . potassium chloride SA  40 mEq  Oral Daily  . verapamil  240 mg Oral Q1200   Continuous Infusions: . heparin 800 Units/hr (04/16/17 1227)     LOS: 1 day    Elsa Ploch Tanna Furry, MD [facility] Pager [phone]  If 7PM-7AM, please contact night-coverage [email or web address] Password [facility] 04/16/2017, 2:23 PM

## 2017-04-16 NOTE — Progress Notes (Signed)
ANTICOAGULATION CONSULT NOTE - follow-up  Pharmacy Consult for heparin Indication: chest pain/ACS  Allergies  Allergen Reactions  . Cefuroxime Nausea And Vomiting    Patient Measurements: Height: 5\' 7"  (170.2 cm) Weight: 149 lb 7.6 oz (67.8 kg) IBW/kg (Calculated) : 61.6 Heparin Dosing Weight: actual body weight  Vital Signs: Temp: 98.1 F (36.7 C) (06/27 1038) Temp Source: Oral (06/27 1038) BP: 163/65 (06/27 1038) Pulse Rate: 76 (06/27 1038)  Labs:  Recent Labs  04/15/17 1337 04/15/17 1517 04/15/17 2010 04/15/17 2148 04/16/17 0044 04/16/17 0735  HGB 9.1*  --   --   --   --  8.5*  HCT 32.6*  --   --   --   --  28.7*  PLT 309  --   --   --   --  281  APTT  --  34  --   --   --   --   LABPROT  --  13.5  --   --   --   --   INR  --  1.02  --   --   --   --   HEPARINUNFRC  --   --   --   --  0.36 0.38  CREATININE 0.58  --   --   --   --  0.53  TROPONINI 3.07*  --  2.82* 2.65*  --  2.86*    Estimated Creatinine Clearance: 44.5 mL/min (by C-G formula based on SCr of 0.53 mg/dL).   Medical History: Past Medical History:  Diagnosis Date  . Cataract   . Chest pain   . COPD (chronic obstructive pulmonary disease) ([place])   . Debility 01/21/2016  . Hearing loss 01/21/2016  . Hyperglycemia 01/21/2016  . Hyperlipidemia   . Hypertension   . Left bundle branch block   . Macular degeneration 01/21/2016  . Major depression 01/29/2016  . Memory loss 01/21/2016  . Paroxysmal atrial fibrillation ([facility])   . Personal history of subdural hematoma   . Subdural hematoma ([place]) 2009  . Tobacco abuse     Assessment: 4 y/oF who presented to [facility] ED on 04/15/2017 with generalized weakness and SOB that started last night. She also states she had "chest soreness all day yesterday." Troponin elevated in ED at 3.07. D-dimer elevated at 0.89. CT of head with no acute intracranial abnormality. CTa of chest pending. Pharmacy consulted to start heparin infusion. Patient not on any anticoagulants PTA.    Today, 04/16/2017  Heparin level = 0.38 (therapeutic)  CBC: anemia (Hgb decreased), pltc WNL  Troponin remain elevated  CTA of chest: no PE  Lipid panel pending - on home dose of atorvastatin 40mg  (note also on verapamil - can increase risk of myopathy/rhabdo by inhibiting metabolism of atorvastatin).   Goal of Therapy:  Heparin level 0.3-0.7 units/ml Monitor platelets by anticoagulation protocol: Yes   Plan:   Heparin level (ie anti-Xa level) therapeutic x2, continue heparin infusion at 800 units/hr  Daily CBC and heparin level  Monitor closely for s/sx of bleeding  Doreene Eland, PharmD, BCPS.   Pager: [phone] 04/16/2017 10:57 AM

## 2017-04-17 DIAGNOSIS — R748 Abnormal levels of other serum enzymes: Secondary | ICD-10-CM

## 2017-04-17 LAB — CBC
HCT: 32.7 % — ABNORMAL LOW (ref 36.0–46.0)
Hemoglobin: 9.7 g/dL — ABNORMAL LOW (ref 12.0–15.0)
MCH: 27.2 pg (ref 26.0–34.0)
MCHC: 29.7 g/dL — AB (ref 30.0–36.0)
MCV: 91.9 fL (ref 78.0–100.0)
PLATELETS: 325 10*3/uL (ref 150–400)
RBC: 3.56 MIL/uL — AB (ref 3.87–5.11)
RDW: 14.5 % (ref 11.5–15.5)
WBC: 11 10*3/uL — ABNORMAL HIGH (ref 4.0–10.5)

## 2017-04-17 LAB — HEPARIN LEVEL (UNFRACTIONATED): HEPARIN UNFRACTIONATED: 0.11 [IU]/mL — AB (ref 0.30–0.70)

## 2017-04-17 MED ORDER — ENOXAPARIN SODIUM 40 MG/0.4ML ~~LOC~~ SOLN
40.0000 mg | SUBCUTANEOUS | Status: DC
  Administered 2017-04-17 – 2017-04-18 (×2): 40 mg via SUBCUTANEOUS
  Filled 2017-04-17 (×2): qty 0.4

## 2017-04-17 MED ORDER — METOPROLOL SUCCINATE ER 100 MG PO TB24
100.0000 mg | ORAL_TABLET | Freq: Every day | ORAL | Status: DC
  Administered 2017-04-17: 100 mg via ORAL
  Filled 2017-04-17 (×2): qty 1

## 2017-04-17 NOTE — Progress Notes (Signed)
RN notified Staffing @ [phone] r/t telesitter monitoring.

## 2017-04-17 NOTE — Progress Notes (Signed)
ANTICOAGULATION CONSULT NOTE - follow-up  Pharmacy Consult for heparin Indication: chest pain/ACS  Allergies  Allergen Reactions  . Cefuroxime Nausea And Vomiting    Patient Measurements: Height: 5\' 7"  (170.2 cm) Weight: 149 lb 7.6 oz (67.8 kg) IBW/kg (Calculated) : 61.6 Heparin Dosing Weight: actual body weight  Vital Signs: Temp: 97.6 F (36.4 C) (06/28 0402) Temp Source: Axillary (06/28 0402) BP: 167/81 (06/28 0402) Pulse Rate: 102 (06/28 0402)  Labs:  Recent Labs  04/15/17 1337 04/15/17 1517 04/15/17 2010 04/15/17 2148 04/16/17 0044 04/16/17 0735 04/17/17 0516  HGB 9.1*  --   --   --   --  8.5* 9.7*  HCT 32.6*  --   --   --   --  28.7* 32.7*  PLT 309  --   --   --   --  281 325  APTT  --  34  --   --   --   --   --   LABPROT  --  13.5  --   --   --   --   --   INR  --  1.02  --   --   --   --   --   HEPARINUNFRC  --   --   --   --  0.36 0.38 0.11*  CREATININE 0.58  --   --   --   --  0.53  --   TROPONINI 3.07*  --  2.82* 2.65*  --  2.86*  --     Estimated Creatinine Clearance: 44.5 mL/min (by C-G formula based on SCr of 0.53 mg/dL).   Medical History: Past Medical History:  Diagnosis Date  . Cataract   . Chest pain   . COPD (chronic obstructive pulmonary disease) ([place])   . Debility 01/21/2016  . Hearing loss 01/21/2016  . Hyperglycemia 01/21/2016  . Hyperlipidemia   . Hypertension   . Left bundle branch block   . Macular degeneration 01/21/2016  . Major depression 01/29/2016  . Memory loss 01/21/2016  . Paroxysmal atrial fibrillation ([facility])   . Personal history of subdural hematoma   . Subdural hematoma ([place]) 2009  . Tobacco abuse     Assessment: 76 y/oF who presented [facility] ED on 04/15/2017 with generalized weakness and SOB that started last night. She also states she had "chest soreness all day yesterday." Troponin elevated in ED at 3.07. D-dimer elevated at 0.89. CT of head with no acute intracranial abnormality. CTa of chest pending. Pharmacy consulted  to start heparin infusion. Patient not on any anticoagulants PTA.   Today, 04/17/2017  Heparin level = 0.11 (SUBtherapeutic) this morning on heparin 800 units/hr.  Previous two levels therapeutic  Discussed with night RN and patient pulled out IV overnight (exact timing and duration infusion was stopped are unknown)  CBC: anemia (Hgb decreased but improved overnight), pltc WNL  Troponin remain elevated  CTA of chest: no PE  On home dose of atorvastatin 40mg  (note also on verapamil - can increase risk of myopathy/rhabdo by inhibiting metabolism of atorvastatin).   Goal of Therapy:  Heparin level 0.3-0.7 units/ml Monitor platelets by anticoagulation protocol: Yes   Plan:   Heparin level subtherapeutic this morning, based on heparin infusion being lost overnight d/t loss of IV access will continue heparin at 800 units/hr  Plan heparin x 48h which will be this afternoon at 4pm.  If this remains plan will no recheck further levels  Daily CBC and heparin level  Monitor closely for  s/sx of bleeding  Doreene Eland, PharmD, BCPS.   Pager: [phone] 04/17/2017 7:23 AM

## 2017-04-17 NOTE — Evaluation (Signed)
Occupational Therapy Evaluation Patient Details Name: [name] MRN: [id number] DOB: [birth year] Today's Date: 04/17/2017    History of Present Illness 83 yo female admitted with NSTEMI, weakness. Hx of COPD-O2 dep, SDH 2009, LBBB, A fib, macular degeneration, dementia.   Clinical Impression   Pt was admitted for the above. At baseline, she lives with son is mod I with toileting and has assist for bathing/dressing once a week.  Pt was given Ativan last night per RN and was confused but alert and followed commands.  She will benefit from continued OT to increase safety and independence with adls.      Follow Up Recommendations  SNF    Equipment Recommendations   (possibly 3:1 commode)    Recommendations for Other Services       Precautions / Restrictions Precautions Precautions: Fall Restrictions Weight Bearing Restrictions: No      Mobility Bed Mobility Overal bed mobility: Needs Assistance Bed Mobility: Rolling;Supine to Sit Rolling: Mod assist   Supine to sit: Max assist;+2 for safety/equipment     General bed mobility comments: used rail to sit up.  Transfers Overall transfer level: Needs assistance Equipment used: Rolling walker (2 wheeled) Transfers: Sit to/from Stand Sit to Stand: Max assist;+2 physical assistance         General transfer comment: assist to rise and stabilize.  Pt leaning against back of bed with posterior lean    Balance Overall balance assessment: Needs assistance   Sitting balance-Leahy Scale: Fair Sitting balance - Comments: pt would drift posteriorly but self corrected     Standing balance-Leahy Scale: Zero                             ADL either performed or assessed with clinical judgement   ADL Overall ADL's : Needs assistance/impaired     Grooming: Set up;Sitting;Wash/dry face   Upper Body Bathing: Moderate assistance;Sitting   Lower Body Bathing: Maximal assistance;+2 for physical assistance;Bed  level   Upper Body Dressing : Moderate assistance;Sitting   Lower Body Dressing: Total assistance;+2 for physical assistance;Bed level       Toileting- Clothing Manipulation and Hygiene: Total assistance;+2 for physical assistance;Bed level         General ADL Comments: pt was incontinent x urine.  She stood with max +2 with legs supported against bed.  Had to roll to clean her up     Vision         Perception     Praxis      Pertinent Vitals/Pain Pain Assessment: No/denies pain     Hand Dominance     Extremity/Trunk Assessment Upper Extremity Assessment Upper Extremity Assessment: Difficult to assess due to impaired cognition           Communication Communication Communication: HOH   Cognition Arousal/Alertness: Awake/alert (drowsy) Behavior During Therapy: WFL for tasks assessed/performed Overall Cognitive Status: Impaired/Different from baseline Area of Impairment: Memory;Orientation;Problem solving                 Orientation Level: Disoriented to;Place;Time;Situation   Memory: Decreased short-term memory       Problem Solving: Requires verbal cues;Requires tactile cues General Comments: pt confused at this time. Followed one step commands.     General Comments       Exercises     Shoulder Instructions      Home Living Family/patient expects to be discharged to:: Unsure Living Arrangements: Children  Additional Comments: one son present in room      Prior Functioning/Environment Level of Independence: Needs assistance  Gait / Transfers Assistance Needed: RW for ambulation.  ADL's / Homemaking Assistance Needed: aide comes every friday to help with bathing   Comments: son states that pt got herself to bathroom. He said that she stayed in the same clothes for a week until aide came back        OT Problem List: Decreased strength;Decreased activity tolerance;Impaired balance (sitting  and/or standing);Decreased cognition;Decreased safety awareness;Decreased knowledge of use of DME or AE      OT Treatment/Interventions: Self-care/ADL training;DME and/or AE instruction;Patient/family education;Cognitive remediation/compensation;Therapeutic activities    OT Goals(Current goals can be found in the care plan section) Acute Rehab OT Goals Patient Stated Goal: none stated OT Goal Formulation: With family Time For Goal Achievement: 04/24/17 Potential to Achieve Goals: Fair ADL Goals Pt Will Transfer to Toilet: with mod assist;with +2 assist;bedside commode;stand pivot transfer Additional ADL Goal #1: pt will sit eob and perform UB adls with min A  OT Frequency: Min 2X/week   Barriers to D/C:            Co-evaluation PT/OT/SLP Co-Evaluation/Treatment: Yes Reason for Co-Treatment: For patient/therapist safety PT goals addressed during session: Mobility/safety with mobility OT goals addressed during session: ADL's and self-care      AM-PAC PT "6 Clicks" Daily Activity     Outcome Measure Help from another person eating meals?: A Little Help from another person taking care of personal grooming?: A Little Help from another person toileting, which includes using toliet, bedpan, or urinal?: Total Help from another person bathing (including washing, rinsing, drying)?: A Lot Help from another person to put on and taking off regular upper body clothing?: A Lot Help from another person to put on and taking off regular lower body clothing?: A Lot 6 Click Score: 13   End of Session    Activity Tolerance:   Patient left: in bed;with call bell/phone within reach;with bed alarm set;with family/visitor present  OT Visit Diagnosis: Unsteadiness on feet (R26.81);Muscle weakness (generalized) (M62.81)                Time: 0814-4818 OT Time Calculation (min): 29 min Charges:  OT General Charges $OT Visit: 1 Procedure OT Evaluation $OT Eval Low Complexity: 1 Procedure G-Codes:      [place], OTR/L [phone] 04/17/2017  Deandrae Wajda 04/17/2017, 2:21 PM

## 2017-04-17 NOTE — [place] FL2 (Signed)
[place] MEDICAID FL2 LEVEL OF CARE SCREENING TOOL     IDENTIFICATION  Patient Name: [name] Birthdate: March 24, 1924 Sex: female Admission Date (Current Location): 04/15/2017  [facility] and [place] Number:  Herbalist and Address:  [facility],  [place] [address], [place]      Provider Number: [id number]  Attending Physician Name and Address:  Rosita Fire, MD  Relative Name and Phone Number:       Current Level of Care: Hospital Recommended Level of Care: [place] Prior Approval Number:    Date Approved/Denied:   PASRR Number: [id number]  Discharge Plan: SNF    Current Diagnoses: Patient Active Problem List   Diagnosis Date Noted  . NSTEMI (non-ST elevated myocardial infarction) ([place]) 04/15/2017  . Hypokalemia   . COPD with chronic bronchitis ([place])   . Chronic respiratory failure with hypoxia ([place])   . Symptomatic anemia 07/01/2016  . Chronic diastolic CHF (congestive heart failure) ([place]) 07/01/2016  . GERD (gastroesophageal reflux disease) 02/12/2016  . Decubitus ulcer of sacral region, stage 2 01/29/2016  . Major depression 01/29/2016  . Hyperglycemia 01/21/2016  . Hearing loss 01/21/2016  . Macular degeneration 01/21/2016  . Memory loss 01/21/2016  . Anemia 01/21/2016  . Debility 01/21/2016  . Pressure ulcer 01/15/2016  . CAP (community acquired pneumonia) 01/14/2016  . COPD exacerbation ([place]) 01/14/2016  . Acute on chronic respiratory failure with hypoxia ([place]) 01/14/2016  . Leukocytosis 01/14/2016  . Community acquired pneumonia 01/14/2016  . Paroxysmal atrial fibrillation ([place]) 07/30/2013  . Tobacco abuse 07/30/2013  . Hyperlipidemia 07/30/2013  . Essential hypertension 07/30/2013  . Family history of heart disease 07/30/2013  . Dyspnea on exertion 07/30/2013  . COPD (chronic obstructive pulmonary disease) ([place]) 04/06/2012    Orientation RESPIRATION BLADDER Height & Weight     Self,  Time, Place (memory impairment)  O2 Indwelling catheter Weight: 149 lb 7.6 oz (67.8 kg) Height:  5\' 7"  (170.2 cm)  BEHAVIORAL SYMPTOMS/MOOD NEUROLOGICAL BOWEL NUTRITION STATUS  Other (Comment) (no behaviors)   Continent Diet  AMBULATORY STATUS COMMUNICATION OF NEEDS Skin   Extensive Assist Verbally Normal                       Personal Care Assistance Level of Assistance  Bathing, Feeding, Dressing Bathing Assistance: Maximum assistance Feeding assistance: Independent Dressing Assistance: Maximum assistance     Functional Limitations Info  Sight, Hearing, Speech Sight Info: Impaired Hearing Info: Adequate Speech Info: Adequate    SPECIAL CARE FACTORS FREQUENCY  PT (By licensed PT), OT (By licensed OT)     PT Frequency: 5x wk OT Frequency: 5x wk            Contractures Contractures Info: Not present    Additional Factors Info  Code Status, Allergies Code Status Info: full code Allergies Info: CEFUROXIME           Current Medications (04/17/2017):  This is the current hospital active medication list Current Facility-Administered Medications  Medication Dose Route Frequency Provider Last Rate Last Dose  . acetaminophen (TYLENOL) tablet 650 mg  650 mg Oral Q4H PRN Rosita Fire, MD   650 mg at 04/16/17 1107  . aspirin EC tablet 81 mg  81 mg Oral Daily Rosita Fire, MD   81 mg at 04/17/17 5916  . atorvastatin (LIPITOR) tablet 40 mg  40 mg Oral q1800 Rosita Fire, MD   40 mg at 04/16/17 1720  .  budesonide (PULMICORT) nebulizer solution 0.5 mg  0.5 mg Nebulization BID Rosita Fire, MD   0.5 mg at 04/17/17 9024  . enoxaparin (LOVENOX) injection 40 mg  40 mg Subcutaneous Q24H Rosita Fire, MD      . ferrous sulfate tablet 325 mg  325 mg Oral Q breakfast Rosita Fire, MD   325 mg at 04/16/17 2253  . heparin ADULT infusion 100 units/mL (25000 units/239mL sodium chloride 0.45%)  800 Units/hr Intravenous Continuous  Rosita Fire, MD 8 mL/hr at 04/16/17 1227 800 Units/hr at 04/16/17 1227  . ipratropium-albuterol (DUONEB) 0.5-2.5 (3) MG/3ML nebulizer solution 3 mL  3 mL Nebulization QID Rosita Fire, MD   3 mL at 04/17/17 1205  . metoprolol succinate (TOPROL-XL) 24 hr tablet 100 mg  100 mg Oral Daily Nahser, Wonda Cheng, MD   100 mg at 04/17/17 1005  . multivitamin with minerals tablet 1 tablet  1 tablet Oral Daily Rosita Fire, MD   1 tablet at 04/16/17 2253  . nitroGLYCERIN (NITROSTAT) SL tablet 0.4 mg  0.4 mg Sublingual Q5 Min x 3 PRN Rosita Fire, MD      . ondansetron [facility]) injection 4 mg  4 mg Intravenous Q6H PRN Rosita Fire, MD      . potassium chloride SA (K-DUR,KLOR-CON) CR tablet 40 mEq  40 mEq Oral Daily Rosita Fire, MD   40 mEq at 04/17/17 0955  . verapamil (CALAN-SR) CR tablet 240 mg  240 mg Oral Q1200 Rosita Fire, MD   240 mg at 04/17/17 1358     Discharge Medications: Please see discharge summary for a list of discharge medications.  Relevant Imaging Results:  Relevant Lab Results:   Additional Information SSN: [id number]  Avneet Ashmore, [place] An, LCSW

## 2017-04-17 NOTE — Progress Notes (Signed)
    Echo shows vigorous LV function. There is septal hypertrophy with a LVOT gradient  troponins are decreasing .   She is not a candidate for cath  - medical therapy is indicated.   Rec: Continue heparin 1 more day and then DC Increase metoprolol to 50 BID , titrate up as needed / tolerated. ( this will help with the LVOT gradient as well as will slow the HR and decrease the BP )   She needs to stop smoking .  Will sign off.   Call for questions.    Mertie Moores, MD  04/17/2017 8:22 AM    [facility] [place],  [place], [place]  [address] Pager [phone] Phone: [phone]; Fax: [phone]

## 2017-04-17 NOTE — Clinical Social Work Note (Signed)
Clinical Social Work Assessment  Patient Details  Name: [name] MRN: [id number] Date of Birth: 04/28/1924  Date of referral:  04/17/17               Reason for consult:  Discharge Planning                Permission sought to share information with:  Chartered certified accountant granted to share information::  Yes, Verbal Permission Granted  Name::        Agency::     Relationship::     Contact Information:     Housing/Transportation Living arrangements for the past 2 months:  Single Family Home Source of Information:  Adult Children Patient Interpreter Needed:  None Criminal Activity/Legal Involvement Pertinent to Current Situation/Hospitalization:  No - Comment as needed Significant Relationships:  Adult Children Lives with:  Adult Children Do you feel safe going back to the place where you live?   (SNF recommended.) Need for family participation in patient care:  Yes (Comment)  Care giving concerns:  Pt requires more care than available at home following hospital dc.  Social Worker assessment / plan:  Pt hospitalized from home on 04/15/17 with    NSTEMI (non-ST elevated myocardial infarction) ([place]). PT has recommended SNF at d/c. CSW spoke with pt's son, [name], to review PT recommendations and assist with d/c planning. Pt was sleeping soundly when CSW visited. Son is in agreement with SNF placement at d/c. SNF search initiated and bed offers pending. CSW will continue to follow to assist with d/c planning.  Employment status:  Retired Nurse, adult PT Recommendations:  [place] / Referral to community resources:  [place]  Patient/Family's Response to care: Son is in agreement with plan for SNF.  Patient/Family's Understanding of and Emotional Response to Diagnosis, Current Treatment, and Prognosis:  Son reports that pt was in [place] last year and has requested CSW contact  SNF for placement. CSW has contacted [place], as requested, and a decision is pending. Pt's son appreciates CSW assistance.  Emotional Assessment Appearance:  Appears stated age Attitude/Demeanor/Rapport:  Unable to Assess Affect (typically observed):  Unable to Assess Orientation:  Oriented to Place, Oriented to Self, Oriented to  Time Alcohol / Substance use:  Not Applicable Psych involvement (Current and /or in the community):  No (Comment)  Discharge Needs  Concerns to be addressed:  Decision making concerns Readmission within the last 30 days:  No Current discharge risk:  None Barriers to Discharge:  No Barriers Identified   [name]  [phone] 04/17/2017, 3:31 PM

## 2017-04-17 NOTE — Evaluation (Signed)
Physical Therapy Evaluation Patient Details Name: [name] MRN: [id number] DOB: [birth year] Today's Date: 04/17/2017   History of Present Illness  83 yo female admitted with NSTEMI, weakness. Hx of COPD-O2 dep, SDH 2009, LBBB, A fib, macular degeneration, dementia.    Clinical Impression  On eval, pt required Max assist +2 for mobility. Pt partially stood x2 at EOB with RW. Very confused but she follows 1 step commands. Pt is very weak. Will follow and progress activity as able. Recommend SNF at this time.    Follow Up Recommendations SNF    Equipment Recommendations  None recommended by PT    Recommendations for Other Services       Precautions / Restrictions Precautions Precautions: Fall Restrictions Weight Bearing Restrictions: No      Mobility  Bed Mobility Overal bed mobility: Needs Assistance Bed Mobility: Rolling;Supine to Sit;Sit to Supine Rolling: Mod assist   Supine to sit: Max assist;+2 for physical assistance;+2 for safety/equipment;HOB elevated Sit to supine: Max assist;+2 for physical assistance;+2 for safety/equipment;HOB elevated   General bed mobility comments: Assist for trunk and bil LEs. Multimodal cueing required. Increased time. Pt relied on bedrail  Transfers Overall transfer level: Needs assistance Equipment used: Rolling walker (2 wheeled) Transfers: Sit to/from Stand Sit to Stand: Max assist;+2 physical assistance;+2 safety/equipment;From elevated surface         General transfer comment: Attempted x 2. Assist to rise, stabilize, control descent. Posterior bias and flexed trunk and knee posture.   Ambulation/Gait             General Gait Details: NT-pt unable at this time  Stairs            Wheelchair Mobility    Modified Rankin (Stroke Patients Only)       Balance Overall balance assessment: Needs assistance   Sitting balance-Leahy Scale: Fair Sitting balance - Comments: pt would drift posteriorly but self  corrected Postural control: Posterior lean   Standing balance-Leahy Scale: Zero                               Pertinent Vitals/Pain Pain Assessment: No/denies pain    Home Living Family/patient expects to be discharged to:: Unsure Living Arrangements: Children               Additional Comments: one son present in room    Prior Function Level of Independence: Needs assistance   Gait / Transfers Assistance Needed: RW for ambulation.   ADL's / Homemaking Assistance Needed: aide comes every friday to help with bathing  Comments: son states that pt got herself to bathroom. He said that she stayed in the same clothes for a week until aide came back     Hand Dominance        Extremity/Trunk Assessment   Upper Extremity Assessment Upper Extremity Assessment: Defer to OT evaluation    Lower Extremity Assessment Lower Extremity Assessment: Generalized weakness    Cervical / Trunk Assessment Cervical / Trunk Assessment: Kyphotic  Communication   Communication: HOH  Cognition Arousal/Alertness: Awake/alert (drowsy) Behavior During Therapy: WFL for tasks assessed/performed Overall Cognitive Status: Impaired/Different from baseline Area of Impairment: Memory;Orientation;Problem solving                 Orientation Level: Disoriented to;Place;Time;Situation   Memory: Decreased short-term memory       Problem Solving: Requires verbal cues;Requires tactile cues General Comments: pt confused at this time. Followed one  step commands.        General Comments      Exercises     Assessment/Plan    PT Assessment Patient needs continued PT services  PT Problem List Decreased strength;Decreased mobility;Decreased activity tolerance;Decreased balance;Decreased knowledge of use of DME;Decreased cognition       PT Treatment Interventions Gait training;Therapeutic activities;Therapeutic exercise;Patient/family education;DME instruction;Functional  mobility training;Balance training    PT Goals (Current goals can be found in the Care Plan section)  Acute Rehab PT Goals Patient Stated Goal: none stated PT Goal Formulation: With patient/family Time For Goal Achievement: 04/24/17 Potential to Achieve Goals: Good    Frequency Min 3X/week   Barriers to discharge        Co-evaluation   Reason for Co-Treatment: For patient/therapist safety PT goals addressed during session: Mobility/safety with mobility OT goals addressed during session: ADL's and self-care       AM-PAC PT "6 Clicks" Daily Activity  Outcome Measure Difficulty turning over in bed (including adjusting bedclothes, sheets and blankets)?: Total Difficulty moving from lying on back to sitting on the side of the bed? : Total Difficulty sitting down on and standing up from a chair with arms (e.g., wheelchair, bedside commode, etc,.)?: Total Help needed moving to and from a bed to chair (including a wheelchair)?: A Lot Help needed walking in hospital room?: A Lot Help needed climbing 3-5 steps with a railing? : A Lot 6 Click Score: 9    End of Session Equipment Utilized During Treatment: Gait belt Activity Tolerance: Patient tolerated treatment well Patient left: in bed;with call bell/phone within reach;with family/visitor present;with bed alarm set   PT Visit Diagnosis: Muscle weakness (generalized) (M62.81);Difficulty in walking, not elsewhere classified (R26.2)    Time: 1247-1310 PT Time Calculation (min) (ACUTE ONLY): 23 min   Charges:   PT Evaluation $PT Eval Moderate Complexity: 1 Procedure     PT G Codes:          Weston Anna, MPT Pager: [phone]

## 2017-04-17 NOTE — Progress Notes (Addendum)
PROGRESS NOTE    [name]  FGH:[id number] DOB: 08-Dec-1923 DOA: 04/15/2017 PCP: Merrilee Seashore, MD   Brief Narrative: 83 y.o. female with medical history significant of COPD, chronic hypoxic respiratory failure on 2 L of oxygen, hypertension, dyslipidemia, left bundle branch block, history of subdural hematoma in 2009, smoking, paroxysmal atrial fibrillation not on systemic anticoagulation presented with generalized weakness and not feeling well. Patient is a poor historian. In the ER patient was found to have positive troponin of 3.07 with elevated d-dimer. Cardiology was consulted, IV heparin is started and admitted for further evaluation.  Assessment & Plan:   Active Problems:   NSTEMI (non-ST elevated myocardial infarction) ([place])  # Non-ST elevated MI: Patient with generalized weakness, positive troponin and chest pain. History is unreliable because of underlying possible dementia. -CT scan of chest showed possible 3 vessel disease. Evaluated by Cardiologist, patient is not candidate for PCI or any cardiac intervention this time. Continue IV heparin for total 48 hours. Patient will complete IV heparin today afternoon. Discussed with the pharmacist. Continue aspirin, Lipitor, metoprolol and verapamil. Patient denied chest pain today. -Echo showed EF 65-70 % with hyperactive LV function. The dose of metoprolol increased by cardiologist.  #History of COPD with chronic respiratory failure with hypoxia: -Continue 2 L of oxygen -Continue bronchodilators and supportive care. -Monitor oxygen saturation.  #Hyperlipidemia: Continue statin.  #Chronic diastolic congestive heart failure: Does not look like fluid overload.  Continue current cardiac medications. Echocardiogram reviewed.  #Hypokalemia: Replete potassium chloride. Potassium level improved. Magnesium level acceptable.  #Hypertension: Continue metoprolol, verapamil. Monitor blood pressure closely.  #Nodular opacity  in the right lung base with elevated d-dimer and troponin: CT scan of chest with no PE however consistent with left lower lobe lung nodules. Recommended to follow-up with oncologist outpatient if patient and family wants to further explore. Patient with advanced dementia. -PT/OT eval -SW consulted  # Generalized weakness likely in the setting of MI and likely physical deconditioning: Patient has no focal neurological deficit. Muscle strength 5 over 5 in all extremities and symmetric. CT scan of head with no acute finding. Patient will need PT OT evaluation after evaluation/ treatment of MI.  #Possible advanced dementia with behavioral disturbance, likely Alzheimer's: Continue supportive care. Recommended to follow-up with PCP. -agitated last night, likely delirium. -avoid benzo -supportive care and re-orientation. -palliative care consult  DVT prophylaxis: On IV heparin till today and lovenox from tomorrow. Code Status: Full code Family Communication: No family at bedside Disposition Plan: Likely discharge home versus SNF in 1-2 days    Consultants:   Cardiologist  Procedures: Echo Antimicrobials: None  Subjective: Seen and examined at bedside. Overnight events noted. Patient required a sitter. Denied nausea vomiting chest pain or shortness of breath. Review of systems Limited.  Objective: Vitals:   04/17/17 0402 04/17/17 0811 04/17/17 0815 04/17/17 1343  BP: (!) 167/81   (!) 125/58  Pulse: (!) 102   80  Resp: 18   14  Temp: 97.6 F (36.4 C)   97.9 F (36.6 C)  TempSrc: Axillary   Axillary  SpO2: 99% 99% 99% 100%  Weight:      Height:        Intake/Output Summary (Last 24 hours) at 04/17/17 1440 Last data filed at 04/17/17 0643  Gross per 24 hour  Intake            235.6 ml  Output             1550 ml  Net          -  1314.4 ml   Filed Weights   04/15/17 1535 04/16/17 1038  Weight: 68 kg (150 lb) 67.8 kg (149 lb 7.6 oz)    Examination:  General exam: Confused  elderly female sitting on bed comfortable, not in distress  Respiratory system: Clear bilateral. Respiratory effort normal. No wheezing or crackle Cardiovascular system: Regular rate and rhythm, S1-S2 normal. No pedal edema. Gastrointestinal system: Abdomen is nondistended, soft and nontender. Normal bowel sounds heard. Central nervous system: Alert awake and following simple commands Extremities: Symmetric 5 x 5 power. Skin: No rashes, lesions or ulcers Psychiatry: Judgement and insight appear impaired    Data Reviewed: I have personally reviewed following labs and imaging studies  CBC:  Recent Labs Lab 04/15/17 1337 04/16/17 0735 04/17/17 0516  WBC 8.3 8.9 11.0*  NEUTROABS 5.6  --   --   HGB 9.1* 8.5* 9.7*  HCT 32.6* 28.7* 32.7*  MCV 96.2 93.5 91.9  PLT 309 281 008   Basic Metabolic Panel:  Recent Labs Lab 04/15/17 1337 04/16/17 0735  NA 145 141  K 3.3* 4.3  CL 98* 99*  CO2 42* 34*  GLUCOSE 142* 94  BUN 17 16  CREATININE 0.58 0.53  CALCIUM 8.8* 8.9  MG  --  1.9   GFR: Estimated Creatinine Clearance: 44.5 mL/min (by C-G formula based on SCr of 0.53 mg/dL). Liver Function Tests:  Recent Labs Lab 04/15/17 1337  AST 20  ALT 14  ALKPHOS 73  BILITOT 0.2*  PROT 7.1  ALBUMIN 3.7   No results for input(s): LIPASE, AMYLASE in the last 168 hours. No results for input(s): AMMONIA in the last 168 hours. Coagulation Profile:  Recent Labs Lab 04/15/17 1517  INR 1.02   Cardiac Enzymes:  Recent Labs Lab 04/15/17 1337 04/15/17 2010 04/15/17 2148 04/16/17 0735  TROPONINI 3.07* 2.82* 2.65* 2.86*   BNP (last 3 results) No results for input(s): PROBNP in the last 8760 hours. HbA1C: No results for input(s): HGBA1C in the last 72 hours. CBG: No results for input(s): GLUCAP in the last 168 hours. Lipid Profile:  Recent Labs  04/16/17 0735  CHOL 84  HDL 39*  LDLCALC 32  TRIG 63  CHOLHDL 2.2   Thyroid Function Tests: No results for input(s): TSH,  T4TOTAL, FREET4, T3FREE, THYROIDAB in the last 72 hours. Anemia Panel: No results for input(s): VITAMINB12, FOLATE, FERRITIN, TIBC, IRON, RETICCTPCT in the last 72 hours. Sepsis Labs:  Recent Labs Lab 04/15/17 1337  LATICACIDVEN 1.6    Recent Results (from the past 240 hour(s))  Urine culture     Status: Abnormal   Collection Time: 04/15/17 12:59 PM  Result Value Ref Range Status   Specimen Description URINE, CLEAN CATCH  Final   Special Requests NONE  Final   Culture MULTIPLE SPECIES PRESENT, SUGGEST RECOLLECTION (A)  Final   Report Status 04/16/2017 FINAL  Final         Radiology Studies: Ct Angio Chest Pe W Or Wo Contrast  Result Date: 04/15/2017 CLINICAL DATA:  83 year old acute onset of shortness of breath began last night, associated with generalized weakness. Possible left basilar lung nodules on chest x-ray earlier today. EXAM: CT ANGIOGRAPHY CHEST WITH CONTRAST TECHNIQUE: Multidetector CT imaging of the chest was performed using the standard protocol during bolus administration of intravenous contrast. Multiplanar CT image reconstructions and MIPs were obtained to evaluate the vascular anatomy. CONTRAST:  77 mL Isovue 370 IV. COMPARISON:  CTA chest 02/14/2005.  Multiple prior chest x-rays. FINDINGS: Cardiovascular: Contrast opacification of  pulmonary arteries is very good. No filling defects within either main pulmonary artery or their segmental branches in either lung to suggest pulmonary embolism. Cardiac silhouette moderately enlarged with left ventricular hypertrophy. Extensive three-vessel coronary atherosclerosis. Prominent epicardial fat. No pericardial effusion. Severe atherosclerosis involving the thoracic and upper abdominal aorta without evidence of aneurysm. Atherosclerosis involving the proximal great vessels without visible stenosis. Mediastinum/Nodes: Upper normal sized AP window mediastinal lymph node measuring 1.2 x 1.9 cm, unchanged since the 2006 examination.  No pathologic lymphadenopathy. Normal appearing gas-filled esophagus. Multinodular goiter, with the largest nodule arising from the lower pole of the left lobe of the thyroid gland measuring approximately 2.4 cm, not significantly changed since 2006. Lungs/Pleura: Emphysematous changes throughout both lungs. Pleural based nodule with irregular margins in the lateral left lower lobe measuring approximately 1.5 x 1.2 cm (series 7, image 65) and adjacent subpleural nodule slightly anterior and inferior in the left lower lobe measuring approximately 0.9 x 0.7 cm (series 7, image 69), not present on the prior CT for on prior chest x-rays. No parenchymal nodules or masses elsewhere in either lung. No confluent airspace consolidation. No pleural effusions. Right apical pleuroparenchymal scarring. Fluid/mucous within multiple right lower lobe segmental and subsegmental bronchi. Remaining central airways patent with tracheobronchial cartilage calcification. Upper Abdomen: Subcentimeter cysts arising from the upper pole of the left kidney. Approximate 1.4 cm nodule arising from the left adrenal gland, not imaged on the prior CT. Visualized upper abdomen otherwise unremarkable. Musculoskeletal: Osseous demineralization. Degenerative disc disease and spondylosis throughout the thoracic spine. No acute osseous abnormalities. Review of the MIP images confirms the above findings. IMPRESSION: 1. No evidence of pulmonary embolism. 2. Adjacent pleural based nodules in the lateral left lower lobe, the largest measuring 1.5 cm with irregular margins. Primary pulmonary malignancy is suspected. 3. Severe COPD/emphysema. Fluid/mucous plugging involving multiple segmental and subsegmental right lower lobe bronchi. No acute cardiopulmonary disease otherwise. 4. Moderate cardiomegaly. Extensive three-vessel coronary atherosclerosis. Severe thoracic and upper abdominal aortic atherosclerosis without aneurysm. 5. 1.4 cm nodule arising from the  left adrenal gland, indeterminate. 6. Multinodular goiter, not significantly changed since 2006. Aortic Atherosclerosis (ICD10-I70.0) and Emphysema (ICD10-J43.9). Electronically Signed   By: Evangeline Dakin M.D.   On: 04/15/2017 18:19        Scheduled Meds: . aspirin EC  81 mg Oral Daily  . atorvastatin  40 mg Oral q1800  . budesonide  0.5 mg Nebulization BID  . enoxaparin (LOVENOX) injection  40 mg Subcutaneous Q24H  . ferrous sulfate  325 mg Oral Q breakfast  . ipratropium-albuterol  3 mL Nebulization QID  . metoprolol succinate  100 mg Oral Daily  . multivitamin with minerals  1 tablet Oral Daily  . potassium chloride SA  40 mEq Oral Daily  . verapamil  240 mg Oral Q1200   Continuous Infusions: . heparin 800 Units/hr (04/16/17 1227)     LOS: 2 days    Brantleigh Mifflin Tanna Furry, MD [facility] Pager [phone]  If 7PM-7AM, please contact night-coverage [email or web address] Password [email or web address] 04/17/2017, 2:40 PM

## 2017-04-17 NOTE — Clinical Social Work Placement (Signed)
   CLINICAL SOCIAL WORK PLACEMENT  NOTE  Date:  04/17/2017  Patient Details  Name: [name] MRN: [id number] Date of Birth: 1924-08-07  Clinical Social Work is seeking post-discharge placement for this patient at the [place] level of care (*CSW will initial, date and re-position this form in  chart as items are completed):      Patient/family provided with [facility] Work Department's list of facilities offering this level of care within the geographic area requested by the patient (or if unable, by the patient's family).  Yes   Patient/family informed of their freedom to choose among providers that offer the needed level of care, that participate in Medicare, Medicaid or managed care program needed by the patient, have an available bed and are willing to accept the patient.  Yes   Patient/family informed of [facility]'s ownership interest in [facility] and [facility], as well as of the fact that they are under no obligation to receive care at these facilities.  PASRR submitted to EDS on       PASRR number received on       Existing PASRR number confirmed on 04/17/17     FL2 transmitted to all facilities in geographic area requested by pt/family on 04/17/17     FL2 transmitted to all facilities within larger geographic area on       Patient informed that his/her managed care company has contracts with or will negotiate with certain facilities, including the following:            Patient/family informed of bed offers received.  Patient chooses bed at       Physician recommends and patient chooses bed at      Patient to be transferred to   on  .  Patient to be transferred to facility by       Patient family notified on   of transfer.  Name of family member notified:        PHYSICIAN       Additional Comment:    _______________________________________________ Luretha Rued, [place] 04/17/2017, 3:40 PM

## 2017-04-18 DIAGNOSIS — Z7189 Other specified counseling: Secondary | ICD-10-CM

## 2017-04-18 DIAGNOSIS — Z515 Encounter for palliative care: Secondary | ICD-10-CM

## 2017-04-18 NOTE — Progress Notes (Signed)
PROGRESS NOTE    [name]  LPF:[id number] DOB: [birth year] DOA: 04/15/2017 PCP: Merrilee Seashore, MD   Brief Narrative:   83 y.o.  COPD,  chronic hypoxic respiratory failure on 2 L of oxygen,  hypertension,  dyslipidemia,  left bundle branch block,  history of subdural hematoma in 2009,  Smoking,  paroxysmal atrial fibrillation not on systemic anticoagulation   presented with generalized weakness and not feeling well. Patient is a poor historian. In the ER patient was found to have positive troponin of 3.07 with elevated d-dimer. Cardiology was consulted, IV heparin is started and admitted for further evaluation.  Assessment & Plan:   Active Problems:   NSTEMI (non-ST elevated myocardial infarction) ([place])  # Non-ST elevated MI:  Patient with generalized weakness, positive troponin and chest pain.  -CT scan of chest showed possible 3 vessel disease. PerCardiologist, patient is not candidate for PCI or any cardiac intervention this time.  Was on IV heparin for total 48 hours--discontinued and started on prophylactic dosing of Lovenox Continue aspirin, Lipitor, metoprolol and verapamil. Patient denied chest pain today. -Echo showed EF 65-70 % with hyperactive LV function. The dose of metoprolol increased by cardiologist.  #History of COPD with chronic respiratory failure with hypoxia:  Has been on oxygen for over one or 2 years -Continue 2 L of oxygen -Continue bronchodilators and supportive care. -Monitor oxygen saturation.  #Hyperlipidemia: Continue statin.  #Chronic diastolic congestive heart failure:  Does not look like fluid overload.   Continue current cardiac medications.  Echocardiogram reviewed.  #Hypokalemia: Replete potassium chloride. Potassium level improved. Magnesium level acceptable.  #Hypertension: Continue metoprolol, verapamil. Monitor blood pressure closely.  #Nodular opacity in the right lung base with elevated d-dimer and troponin:    CT scan of chest with no PE however consistent with left lower lobe lung nodules.  Recommended to follow-up with oncologist outpatient if patient and family wants to further explore.  Patient with mod dementia. -PT/OT eval -SW consulted  # Generalized weakness likely in the setting of MI and likely physical deconditioning:  Patient has no focal neurological deficit. M uscle strength 5 over 5 in all extremities and symmetric.  CT scan of head with no acute finding. Patient will need PT OT evaluation after evaluation/ treatment of MI.  #Possible advanced dementia with behavioral disturbance, likely Alzheimer's: Continue supportive care. Recommended to follow-up with PCP. -agitated last night, likely delirium. -avoid benzo -supportive care and re-orientation. -palliative care consult  DVT prophylaxis: On IV heparin till today and lovenox from tomorrow. Code Status: Full code Family Communication: No family at bedside Disposition Plan: Likely SNF in 1-2 days    Consultants:   Cardiologist  Procedures:  Echo  - Normal LV size with moderate asymmetric septal hypertrophy. There   was a mild LV outflow tract gradient to 22 mmHg peak. Normal RV   size and systolic function. In one view, there appeared to be a   degree of mitral valve systolic anterior motion. This could be   consistent with hypertrophic obstructive cardiomyopathy   Antimicrobials: None  Subjective:  Reports of confusion per previously in this hospital visit however this morning she appears to be able to tell me time, date, year, president No new other issues Eating and drinking to some extent No fever no chills and no chest pain at present  Objective: Vitals:   04/18/17 0623 04/18/17 0730 04/18/17 0733 04/18/17 1354  BP: (!) 136/54   (!) 106/43  Pulse: 68   75  Resp:  14   18  Temp: 97.6 F (36.4 C)   98.2 F (36.8 C)  TempSrc: Axillary   Oral  SpO2: 100% 93% 93% 98%  Weight:      Height:         Intake/Output Summary (Last 24 hours) at 04/18/17 1450 Last data filed at 04/18/17 3244  Gross per 24 hour  Intake                0 ml  Output              200 ml  Net             -200 ml   Filed Weights   04/15/17 1535 04/16/17 1038  Weight: 68 kg (150 lb) 67.8 kg (149 lb 7.6 oz)    Examination:  Alert oriented X4 S1 and S2 no murmur rub or gallop, no JVD at 30, no bruit Chest clinically clear without added sound Abdomen soft nontender no rebound Lower extremities trace edema External ocular movements intact, able to raise both legs off of bed Stasis dermatitis changes lower extremities Euthymic and congruent   Data Reviewed: I have personally reviewed following labs and imaging studies  CBC:  Recent Labs Lab 04/15/17 1337 04/16/17 0735 04/17/17 0516  WBC 8.3 8.9 11.0*  NEUTROABS 5.6  --   --   HGB 9.1* 8.5* 9.7*  HCT 32.6* 28.7* 32.7*  MCV 96.2 93.5 91.9  PLT 309 281 010   Basic Metabolic Panel:  Recent Labs Lab 04/15/17 1337 04/16/17 0735  NA 145 141  K 3.3* 4.3  CL 98* 99*  CO2 42* 34*  GLUCOSE 142* 94  BUN 17 16  CREATININE 0.58 0.53  CALCIUM 8.8* 8.9  MG  --  1.9   GFR: Estimated Creatinine Clearance: 44.5 mL/min (by C-G formula based on SCr of 0.53 mg/dL). Liver Function Tests:  Recent Labs Lab 04/15/17 1337  AST 20  ALT 14  ALKPHOS 73  BILITOT 0.2*  PROT 7.1  ALBUMIN 3.7   No results for input(s): LIPASE, AMYLASE in the last 168 hours. No results for input(s): AMMONIA in the last 168 hours. Coagulation Profile:  Recent Labs Lab 04/15/17 1517  INR 1.02   Cardiac Enzymes:  Recent Labs Lab 04/15/17 1337 04/15/17 2010 04/15/17 2148 04/16/17 0735  TROPONINI 3.07* 2.82* 2.65* 2.86*   BNP (last 3 results) No results for input(s): PROBNP in the last 8760 hours. HbA1C: No results for input(s): HGBA1C in the last 72 hours. CBG: No results for input(s): GLUCAP in the last 168 hours. Lipid Profile:  Recent Labs   04/16/17 0735  CHOL 84  HDL 39*  LDLCALC 32  TRIG 63  CHOLHDL 2.2   Thyroid Function Tests: No results for input(s): TSH, T4TOTAL, FREET4, T3FREE, THYROIDAB in the last 72 hours. Anemia Panel: No results for input(s): VITAMINB12, FOLATE, FERRITIN, TIBC, IRON, RETICCTPCT in the last 72 hours. Sepsis Labs:  Recent Labs Lab 04/15/17 1337  LATICACIDVEN 1.6    Recent Results (from the past 240 hour(s))  Urine culture     Status: Abnormal   Collection Time: 04/15/17 12:59 PM  Result Value Ref Range Status   Specimen Description URINE, CLEAN CATCH  Final   Special Requests NONE  Final   Culture MULTIPLE SPECIES PRESENT, SUGGEST RECOLLECTION (A)  Final   Report Status 04/16/2017 FINAL  Final     Radiology Studies: No results found.   Scheduled Meds: . aspirin EC  81 mg Oral  Daily  . atorvastatin  40 mg Oral q1800  . budesonide  0.5 mg Nebulization BID  . enoxaparin (LOVENOX) injection  40 mg Subcutaneous Q24H  . ferrous sulfate  325 mg Oral Q breakfast  . ipratropium-albuterol  3 mL Nebulization QID  . metoprolol succinate  100 mg Oral Daily  . multivitamin with minerals  1 tablet Oral Daily  . potassium chloride SA  40 mEq Oral Daily  . verapamil  240 mg Oral Q1200   Continuous Infusions:    LOS: 3 days    Verneita Griffes, MD [facility] ([phone]  If 7PM-7AM, please contact night-coverage [email or web address] Password [email or web address] 04/18/2017, 2:50 PM

## 2017-04-18 NOTE — Progress Notes (Addendum)
Pt has a SNF bed today at [facility] if stable and sitter free 24 hrs.  CSW will assist with d/c planning.  Werner Lean LCSW [phone]

## 2017-04-18 NOTE — Consult Note (Signed)
Consultation Note Date: 04/18/2017   Patient Name: [name]  DOB: 02-05-1924  MRN: [id number]  Age / Sex: 83 y.o., female  PCP: Brandi Seashore, MD Referring Physician: Nita Sells, MD  Reason for Consultation: Establishing goals of care  HPI/Patient Profile: 83 y.o. female  with past medical history of COPD on 2L [place], hypoxic respiratory failure, HTN, HLD, diastolic congestive heart failure, LBBB, subdural hematoma, afib, and smoker admitted on 04/15/2017 with chest pain. In ED, elevated troponin of 3.07 and elevated d-dimer. Cardiology consulted. NSTEMI. Appears to have 3 vessel disease from CT chest. Patient is not a candidate for invasive cardiac workup including cardiac cath. Vaughn be managed medically. Also with findings of lung nodules. FULL code. Palliative medicine consultation for goals of care.     Clinical Assessment and Goals of Care: I have reviewed medical records, discussed with care team, and met with patient at bedside. She is awake, alert, and pleasantly confused but able to participate in our conversation. Sitting up in recliner. She ate her entire tray of lunch. No family at bedside.   Introduced palliative care. She tells me she lives at home with son, [name]. She cannot tell me how long she has had chronic diseases. She does not remember what brought her into the hospital. Patient tells me she does NOT have a living Vaughn or documented HCPOA. "We have been meaning to do that for awhile." I asked [name] if she has thought about her wishes regarding resuscitation and breathing machines. She immediately states "I do not want my chest pounded on" and "The good Lord Vaughn take me when it's my time." Also that she would "not want to be on machines." She shares a story of her father being resuscitated and "black and blue in the chest" and with cracked ribs. I strongly encouraged her to  discuss her EOL wishes with her two sons and complete advanced directives.     After my conversation with [name], I spoke with son [name]) via telephone. Introduced Palliative Medicine as specialized medical care for people living with serious illness. It focuses on providing relief from the symptoms and stress of a serious illness. The goal is to improve quality of life for both the patient and the family.  We discussed a brief life review of the patient. She has lived with [name] for over a year. He tells me she has not been diagnosed with dementia but that she has become more forgetful. Prior to hospitalization, patient able to ambulate with walker, perform ADL's with assist, and with great appetite (eats 3 full meals a day).   Discussed hospital diagnoses, interventions, and underlying co-morbidities. Many times throughout the conversation, [name] repeats himself. He understands she had a heart attack and is not a candidate for invasive cardiac interventions. He could not recall hearing about lung nodules.   Advanced directives, concepts specific to code status, and artifical feeding and hydration were considered and discussed. [name] confirms she does NOT have a documented living Vaughn or HCPOA. I  spoke of my conversation with his mother about not wanting her chest pounded on or life prolonged by machines. [name] seems surprised by her conversation with me. He states "well we would want her resuscitated."    Educated on medical recommendation for DNR/DNI with age and underlying chronic conditions. Strongly encouraged him to facilitate a conversation with mother and brother regarding EOL wishes and code status. Also consider completing advanced directives while she is able to communicate her wishes with them. He states "none of [place] are promised tomorrow" and shares a story of his grandmother living till age 89.     [name] is hopeful she will do well at rehab and able to return home.   Questions and  concerns were addressed. Emotional support provided to patient and son.     SUMMARY OF RECOMMENDATIONS    FULL code/FULL scope  Strongly encouraged son to discuss code status and complete advanced directives with the patient. She tells me she would want DNR/DNI. Son wants FULL code.   Likely discharge to rehab this weekend. Could benefit from outpatient palliative services to continue Circle conversations with patient/family.   Code Status/Advance Care Planning: Full code  Symptom Management:   Per attending  Palliative Prophylaxis:   Aspiration, Delirium Protocol, Oral Care and Turn Reposition  Additional Recommendations (Limitations, Scope, Preferences):  Full Scope Treatment  Psycho-social/Spiritual:   Desire for further Chaplaincy support:yes  Additional Recommendations: Caregiving  Support/Resources  Prognosis:   Unable to determine  Discharge Planning: [place] for rehab with Palliative care service follow-up      Primary Diagnoses: Present on Admission: **None**   I have reviewed the medical record, interviewed the patient and family, and examined the patient. The following aspects are pertinent.  Past Medical History:  Diagnosis Date  . Cataract   . Chest pain   . COPD (chronic obstructive pulmonary disease) ([place])   . Debility 01/21/2016  . Hearing loss 01/21/2016  . Hyperglycemia 01/21/2016  . Hyperlipidemia   . Hypertension   . Left bundle branch block   . Macular degeneration 01/21/2016  . Major depression 01/29/2016  . Memory loss 01/21/2016  . Paroxysmal atrial fibrillation ([facility])   . Personal history of subdural hematoma   . Subdural hematoma ([place]) 2009  . Tobacco abuse    Social History   Social History  . Marital status: Divorced    Spouse name: N/A  . Number of children: N/A  . Years of education: N/A   Occupational History  . retired    Social History Main Topics  . Smoking status: Current Every Day Smoker    Packs/day:  1.00    Years: 73.00    Types: Cigarettes    Start date: 07/30/1945  . Smokeless tobacco: Never Used  . Alcohol use No  . Drug use: No  . Sexual activity: Not Asked   Other Topics Concern  . None   Social History Narrative   Lives with her son.   Home oxygen.   Ambulates with a rolling walker.   Family History  Problem Relation Age of Onset  . Diabetes Mother   . Heart disease Mother   . Heart disease Father    Scheduled Meds: . aspirin EC  81 mg Oral Daily  . atorvastatin  40 mg Oral q1800  . budesonide  0.5 mg Nebulization BID  . enoxaparin (LOVENOX) injection  40 mg Subcutaneous Q24H  . ferrous sulfate  325 mg Oral Q breakfast  . ipratropium-albuterol  3  mL Nebulization QID  . metoprolol succinate  100 mg Oral Daily  . multivitamin with minerals  1 tablet Oral Daily  . potassium chloride SA  40 mEq Oral Daily  . verapamil  240 mg Oral Q1200   Continuous Infusions: PRN Meds:.acetaminophen, nitroGLYCERIN, ondansetron (ZOFRAN) IV Medications Prior to Admission:  Prior to Admission medications   Medication Sig Start Date End Date Taking? Authorizing Provider  acetaminophen (TYLENOL) 325 MG tablet Take 2 tablets (650 mg total) by mouth every 6 (six) hours as needed for mild pain (or Fever >/= 101). 07/04/16  Yes Robbie Lis, MD  albuterol (PROVENTIL HFA;VENTOLIN HFA) 108 (90 BASE) MCG/ACT inhaler Inhale 2 puffs into the lungs every 6 (six) hours as needed for wheezing or shortness of breath.    Yes [provider]  albuterol (PROVENTIL) (2.5 MG/3ML) 0.083% nebulizer solution Take 2.5 mg by nebulization 4 (four) times daily.   Yes [provider]  aspirin EC 81 MG tablet Take 81 mg by mouth daily.   Yes [provider]  atorvastatin (LIPITOR) 40 MG tablet Take 40 mg by mouth daily at 6 PM. Every other day   Yes [provider]  budesonide (PULMICORT) 0.5 MG/2ML nebulizer solution Take 0.5 mg by nebulization 2 (two) times daily.   Yes  [provider]  diazepam (VALIUM) 5 MG tablet Take 1 tablet (5 mg total) by mouth daily as needed for anxiety. 07/04/16  Yes Robbie Lis, MD  ferrous sulfate 325 (65 FE) MG tablet Take 1 tablet (325 mg total) by mouth daily with breakfast. 07/05/16  Yes Robbie Lis, MD  fish oil-omega-3 fatty acids 1000 MG capsule Take 1 g by mouth 2 (two) times daily.    Yes [provider]  furosemide (LASIX) 20 MG tablet TAKE 1 TABLET (20 MG TOTAL) BY MOUTH DAILY AS NEEDED FOR FLUID OR EDEMA. 02/03/17 05/04/17 Yes Lorretta Harp, MD  meclizine (ANTIVERT) 12.5 MG tablet Take 1 tablet (12.5 mg total) by mouth 3 (three) times daily. Patient taking differently: Take 12.5 mg by mouth 3 (three) times daily as needed for dizziness.  07/27/15  Yes Carmin Muskrat, MD  metoprolol succinate (TOPROL-XL) 50 MG 24 hr tablet Take 1 tablet (50 mg total) by mouth daily. Take with or immediately following a meal. 01/18/16  Yes Charlynne Cousins, MD  Multiple Vitamin (MULTIVITAMIN WITH MINERALS) TABS tablet Take 1 tablet by mouth daily.   Yes [provider]  OXYGEN Inhale 2 L into the lungs at bedtime. Reported on 12/04/2015   Yes [provider]  verapamil (COVERA HS) 240 MG (CO) 24 hr tablet Take 240 mg by mouth daily at 12 noon. noon   Yes [provider]   Allergies  Allergen Reactions  . Cefuroxime Nausea And Vomiting   Review of Systems  Unable to perform ROS  Physical Exam  Constitutional: She is cooperative.  HENT:  Head: Normocephalic and atraumatic.  Cardiovascular: Regular rhythm.   Pulmonary/Chest: Effort normal. No accessory muscle usage. No tachypnea. No respiratory distress.  Abdominal: Normal appearance.  Neurological: She is alert.  Pleasantly confused  Skin: Skin is warm and dry. There is pallor.  Nursing note and vitals reviewed.  Vital Signs: BP (!) 106/43 (BP Location: Left Arm)   Pulse 75   Temp 98.2 F (36.8 C) (Oral)   Resp 18   Ht 5'  7" (1.702 m)   Wt 67.8 kg (149 lb 7.6 oz)   SpO2 98%  BMI 23.41 kg/m  Pain Assessment: No/denies pain   Pain Score: 0-No pain  SpO2: SpO2: 98 % O2 Device:SpO2: 98 % O2 Flow Rate: .O2 Flow Rate (L/min): 2 L/min  IO: Intake/output summary:   Intake/Output Summary (Last 24 hours) at 04/18/17 1613 Last data filed at 04/18/17 1455  Gross per 24 hour  Intake                0 ml  Output              400 ml  Net             -400 ml    LBM: Last BM Date: 04/14/17 Baseline Weight: Weight: 68 kg (150 lb) Most recent weight: Weight: 67.8 kg (149 lb 7.6 oz)     Palliative Assessment/Data: PPS 50%   Flowsheet Rows     Most Recent Value  Intake Tab  Referral Department  Hospitalist  Unit at Time of Referral  Med/Surg Unit  Palliative Care Primary Diagnosis  Other (Comment) [COPD, NSTEMI, 3 vessel CAD, dementia]  Date Notified  04/17/17  Palliative Care Type  New Palliative care  Reason for referral  Clarify Goals of Care  Date of Admission  04/15/17  Date first seen by Palliative Care  04/18/17  # of days IP prior to Palliative referral  2  Clinical Assessment  Palliative Performance Scale Score  50%  Psychosocial & Spiritual Assessment  Palliative Care Outcomes  Patient/Family meeting held?  Yes  Who was at the meeting?  patient and son [name]) via telephone  Palliative Care Outcomes  Clarified goals of care, Linked to palliative care logitudinal support, ACP counseling assistance, Provided psychosocial or spiritual support      Time In: 1540 Time Out: 1650 Time Total: 32mn Greater than 50%  of this time was spent counseling and coordinating care related to the above assessment and plan.  Signed by:  MIhor Dow FNP-C Palliative Medicine Team  Phone: [phone]: [phone]  Please contact Palliative Medicine Team phone at [phone] questions and concerns.  For individual provider: See AShea Evans

## 2017-04-19 DIAGNOSIS — I5030 Unspecified diastolic (congestive) heart failure: Secondary | ICD-10-CM | POA: Diagnosis not present

## 2017-04-19 DIAGNOSIS — I213 ST elevation (STEMI) myocardial infarction of unspecified site: Secondary | ICD-10-CM | POA: Diagnosis not present

## 2017-04-19 DIAGNOSIS — I2510 Atherosclerotic heart disease of native coronary artery without angina pectoris: Secondary | ICD-10-CM | POA: Diagnosis not present

## 2017-04-19 DIAGNOSIS — H3530 Unspecified macular degeneration: Secondary | ICD-10-CM | POA: Diagnosis not present

## 2017-04-19 DIAGNOSIS — I10 Essential (primary) hypertension: Secondary | ICD-10-CM | POA: Diagnosis not present

## 2017-04-19 DIAGNOSIS — D649 Anemia, unspecified: Secondary | ICD-10-CM | POA: Diagnosis not present

## 2017-04-19 DIAGNOSIS — R918 Other nonspecific abnormal finding of lung field: Secondary | ICD-10-CM | POA: Diagnosis not present

## 2017-04-19 DIAGNOSIS — I214 Non-ST elevation (NSTEMI) myocardial infarction: Secondary | ICD-10-CM | POA: Diagnosis not present

## 2017-04-19 DIAGNOSIS — J9611 Chronic respiratory failure with hypoxia: Secondary | ICD-10-CM | POA: Diagnosis not present

## 2017-04-19 DIAGNOSIS — R5381 Other malaise: Secondary | ICD-10-CM | POA: Diagnosis not present

## 2017-04-19 DIAGNOSIS — I480 Paroxysmal atrial fibrillation: Secondary | ICD-10-CM | POA: Diagnosis not present

## 2017-04-19 DIAGNOSIS — R911 Solitary pulmonary nodule: Secondary | ICD-10-CM | POA: Diagnosis not present

## 2017-04-19 DIAGNOSIS — J984 Other disorders of lung: Secondary | ICD-10-CM | POA: Diagnosis not present

## 2017-04-19 DIAGNOSIS — K219 Gastro-esophageal reflux disease without esophagitis: Secondary | ICD-10-CM | POA: Diagnosis not present

## 2017-04-19 DIAGNOSIS — E785 Hyperlipidemia, unspecified: Secondary | ICD-10-CM | POA: Diagnosis not present

## 2017-04-19 DIAGNOSIS — G309 Alzheimer's disease, unspecified: Secondary | ICD-10-CM | POA: Diagnosis not present

## 2017-04-19 DIAGNOSIS — Z789 Other specified health status: Secondary | ICD-10-CM | POA: Diagnosis not present

## 2017-04-19 DIAGNOSIS — I240 Acute coronary thrombosis not resulting in myocardial infarction: Secondary | ICD-10-CM | POA: Diagnosis not present

## 2017-04-19 DIAGNOSIS — I5032 Chronic diastolic (congestive) heart failure: Secondary | ICD-10-CM | POA: Diagnosis not present

## 2017-04-19 DIAGNOSIS — J449 Chronic obstructive pulmonary disease, unspecified: Secondary | ICD-10-CM | POA: Diagnosis not present

## 2017-04-19 MED ORDER — METOPROLOL SUCCINATE ER 100 MG PO TB24: 100.0000 mg | ORAL_TABLET | Freq: Every day | ORAL | 0 refills | Status: AC

## 2017-04-19 NOTE — Care Management Note (Signed)
Case Management Note  Patient Details  Name: He[place] PAONE MRN: [id number] Date of Birth: 1924-06-20  Subjective/Objective:     NSTEMI               Action/Plan: Discharge Planning: Chart reviewed. CSW following for SNF placement. Scheduled dc today to SNF.   PCP Merrilee Seashore MD  Expected Discharge Date:  04/19/17               Expected Discharge Plan:  Skilled Nursing Facility  In-House Referral:  Clinical Social Work  Discharge planning Services  CM Consult  Post Acute Care Choice:  NA Choice offered to:  NA  DME Arranged:  N/A DME Agency:  NA  HH Arranged:  NA HH Agency:  NA  Status of Service:  Completed, signed off  If discussed at [place] of Stay Meetings, dates discussed:    Additional Comments:  Erenest Rasher, RN 04/19/2017, 1:43 PM

## 2017-04-19 NOTE — Progress Notes (Signed)
Report given to Isha at [facility] via telephone.

## 2017-04-19 NOTE — Discharge Summary (Addendum)
Physician Discharge Summary  [name] KGM:[id number] DOB: 12-04-1923 DOA: 04/15/2017  PCP: Merrilee Seashore, MD  Admit date: 04/15/2017 Discharge date: 04/19/2017  Admitted From: Home Disposition:  SNF  Recommendations for Outpatient Follow-up:  1. Follow up with PCP in 1 week  Discharge Condition: Stable CODE STATUS: Full  Diet recommendation: Heart healthy   Brief/Interim Summary: HPI by Dr. Carolin Sicks: [name] is a 83 y.o. female with medical history significant of COPD, chronic hypoxic respiratory failure on 2 L of oxygen, hypertension, dyslipidemia, left bundle branch block, history of subdural hematoma in 2009, smoking, paroxysmal atrial fibrillation not on systemic anticoagulation presented with generalized weakness and not feeling well. Patient is a poor historian. She reported chest pain yesterday but denied any pain today. She stated that she was going to bathroom today when she feels weak in her both legs therefore came to the hospital. Denies shortness of breath, fever, chills, nausea or vomiting. Patient likely has dementia and therefore history is limited. She said she lives with her son at home. Reports smoking.  ED Course: In the ER patient was found to have elevated troponin of 3.07, elevated d-dimer. Cardiology consult was requested. Started on heparin drip and aspirin and admitted for further evaluation.  Interim: Patient was evaluated by cardiology and underwent echocardiogram. She is not a candidate for heart cath, PCI or CABG at this point. They recommended medical management and to stop smoking.  Discharge Diagnoses:  Active Problems:   NSTEMI (non-ST elevation myocardial infarction) [facility])   Palliative care by specialist   Goals of care, counseling/discussion   NSTEMI -CT scan of chest showed possible 3 vessel disease. Evaluated by Cardiologist, patient is not candidate for PCI or any cardiac intervention this time. Echo showed EF 65-70 % with  hyperactive LV function. Completed IV heparin 48 hours. Continue aspirin, Lipitor, metoprolol and verapamil.   COPD with chronic respiratory failure with hypoxia -Continue 2 L of oxygen, which is baseline  -Continue bronchodilators and supportive care.  Hyperlipidemia -Continue statin  Chronic diastolic congestive heart failure -Stable   Hypertension -Continue metoprolol, verapamil. Monitor blood pressure closely.  Nodular opacity in the right lung base with elevated d-dimer and troponin: CT scan of chest with no PE however consistent with left lower lobe lung nodules. Recommended to follow-up with oncologist outpatient if patient and family wants to further explore. Patient with advanced dementia.  Generalized weakness likely in the setting of MI and likely physical deconditioning: Patient has no focal neurological deficit. Muscle strength 5 over 5 in all extremities and symmetric. CT scan of head with no acute finding. Patient will need PT OT evaluation after evaluation/treatment of MI.  Possible advanced dementia with behavioral disturbance, likely Alzheimer's: Continue supportive care. Recommended to follow-up with PCP. -Avoid benzo -Supportive care and re-orientation.  Tobacco abuse -Cessation counseling   Discharge Instructions  Discharge Instructions    (HEART FAILURE PATIENTS) Call MD:  Anytime you have any of the following symptoms: 1) 3 pound weight gain in 24 hours or 5 pounds in 1 week 2) shortness of breath, with or without a dry hacking cough 3) swelling in the hands, feet or stomach 4) if you have to sleep on extra pillows at night in order to breathe.    Complete by:  As directed    Call MD for:  difficulty breathing, headache or visual disturbances    Complete by:  As directed    Call MD for:  extreme fatigue    Complete by:  As directed    Call MD for:  persistant dizziness or light-headedness    Complete by:  As directed    Call MD for:  persistant  nausea and vomiting    Complete by:  As directed    Call MD for:  severe uncontrolled pain    Complete by:  As directed    Call MD for:  temperature >100.4    Complete by:  As directed    Diet - low sodium heart healthy    Complete by:  As directed    Discharge instructions    Complete by:  As directed    You were cared for by a hospitalist during your hospital stay. If you have any questions about your discharge medications or the care you received while you were in the hospital after you are discharged, you can call the unit and asked to speak with the hospitalist on call if the hospitalist that took care of you is not available. Once you are discharged, your primary care physician will handle any further medical issues. Please note that NO REFILLS for any discharge medications will be authorized once you are discharged, as it is imperative that you return to your primary care physician (or establish a relationship with a primary care physician if you do not have one) for your aftercare needs so that they can reassess your need for medications and monitor your lab values.   Increase activity slowly    Complete by:  As directed      Allergies as of 04/19/2017      Reactions   Cefuroxime Nausea And Vomiting      Medication List    STOP taking these medications   diazepam 5 MG tablet Commonly known as:  VALIUM     TAKE these medications   acetaminophen 325 MG tablet Commonly known as:  TYLENOL Take 2 tablets (650 mg total) by mouth every 6 (six) hours as needed for mild pain (or Fever >/= 101).   albuterol (2.5 MG/3ML) 0.083% nebulizer solution Commonly known as:  PROVENTIL Take 2.5 mg by nebulization 4 (four) times daily.   albuterol 108 (90 Base) MCG/ACT inhaler Commonly known as:  PROVENTIL HFA;VENTOLIN HFA Inhale 2 puffs into the lungs every 6 (six) hours as needed for wheezing or shortness of breath.   aspirin EC 81 MG tablet Take 81 mg by mouth daily.   atorvastatin 40  MG tablet Commonly known as:  LIPITOR Take 40 mg by mouth daily at 6 PM. Every other day   budesonide 0.5 MG/2ML nebulizer solution Commonly known as:  PULMICORT Take 0.5 mg by nebulization 2 (two) times daily.   ferrous sulfate 325 (65 FE) MG tablet Take 1 tablet (325 mg total) by mouth daily with breakfast.   fish oil-omega-3 fatty acids 1000 MG capsule Take 1 g by mouth 2 (two) times daily.   furosemide 20 MG tablet Commonly known as:  LASIX TAKE 1 TABLET (20 MG TOTAL) BY MOUTH DAILY AS NEEDED FOR FLUID OR EDEMA.   meclizine 12.5 MG tablet Commonly known as:  ANTIVERT Take 1 tablet (12.5 mg total) by mouth 3 (three) times daily. What changed:  when to take this  reasons to take this   metoprolol succinate 100 MG 24 hr tablet Commonly known as:  TOPROL-XL Take 1 tablet (100 mg total) by mouth daily. Take with or immediately following a meal. What changed:  medication strength  how much to take   multivitamin with minerals Tabs  tablet Take 1 tablet by mouth daily.   OXYGEN Inhale 2 L into the lungs at bedtime. Reported on 12/04/2015   verapamil 240 MG (CO) 24 hr tablet Commonly known as:  COVERA HS Take 240 mg by mouth daily at 12 noon. noon       Contact information for follow-up providers    Merrilee Seashore, MD. Schedule an appointment as soon as possible for a visit in 1 week(s).   Specialty:  Internal Medicine Contact information: [address] [place] [address] [phone]            Contact information for after-discharge care    Destination    [facility] Follow up.   Specialty:  [place] information: [place] [phone]                 Allergies  Allergen Reactions  . Cefuroxime Nausea And Vomiting    Consultations:  Cardiology   Palliative care    Procedures/Studies: Dg Chest 2 View  Result Date: 04/15/2017 CLINICAL  DATA:  Weakness and shortness of breath. EXAM: CHEST  2 VIEW COMPARISON:  01/17/2016 FINDINGS: AP and lateral views of the chest were obtained. Hyperexpansion consistent with emphysema. Interstitial markings are diffusely coarsened with chronic features. Nodular densities are identified at the left lung base. The cardio pericardial silhouette is enlarged. Bones are diffusely demineralized. IMPRESSION: 1. Emphysema with adjacent nodular opacities at the right lung base. CT chest without contrast recommended to further evaluate. 2. Cardiomegaly. Electronically Signed   By: Misty Stanley M.D.   On: 04/15/2017 13:24   Ct Head Wo Contrast  Result Date: 04/15/2017 CLINICAL DATA:  83 year old female with legs feeling weak this morning, difficulty ambulating. History subdural hematoma. Initial encounter. EXAM: CT HEAD WITHOUT CONTRAST TECHNIQUE: Contiguous axial images were obtained from the base of the skull through the vertex without intravenous contrast. COMPARISON:  07/27/2015 head CT. FINDINGS: Brain: No intracranial hemorrhage or CT evidence of large acute infarct. Mild global atrophy without hydrocephalus. Mild chronic microvascular changes. No intracranial mass lesion noted on this unenhanced exam. Pituitary gland top-normal for age. Vascular: Vascular calcifications. Skull: Remote right frontal burr-hole procedure is. Sinuses/Orbits: No acute orbital abnormality. Partial opacification left sphenoid sinus. Other: Mastoid air cells and middle ear cavities are clear. IMPRESSION: No acute intracranial abnormality. Mild global atrophy. Mild chronic microvascular changes. Partial opacification left sphenoid sinus. Prior right frontal burr-hole procedure for subdural hematoma drainage per history. Electronically Signed   By: Genia Del M.D.   On: 04/15/2017 14:23   Ct Angio Chest Pe W Or Wo Contrast  Result Date: 04/15/2017 CLINICAL DATA:  83 year old acute onset of shortness of breath began last night,  associated with generalized weakness. Possible left basilar lung nodules on chest x-ray earlier today. EXAM: CT ANGIOGRAPHY CHEST WITH CONTRAST TECHNIQUE: Multidetector CT imaging of the chest was performed using the standard protocol during bolus administration of intravenous contrast. Multiplanar CT image reconstructions and MIPs were obtained to evaluate the vascular anatomy. CONTRAST:  77 mL Isovue 370 IV. COMPARISON:  CTA chest 02/14/2005.  Multiple prior chest x-rays. FINDINGS: Cardiovascular: Contrast opacification of pulmonary arteries is very good. No filling defects within either main pulmonary artery or their segmental branches in either lung to suggest pulmonary embolism. Cardiac silhouette moderately enlarged with left ventricular hypertrophy. Extensive three-vessel coronary atherosclerosis. Prominent epicardial fat. No pericardial effusion. Severe atherosclerosis involving the thoracic and upper abdominal aorta without evidence of aneurysm. Atherosclerosis  involving the proximal great vessels without visible stenosis. Mediastinum/Nodes: Upper normal sized AP window mediastinal lymph node measuring 1.2 x 1.9 cm, unchanged since the 2006 examination. No pathologic lymphadenopathy. Normal appearing gas-filled esophagus. Multinodular goiter, with the largest nodule arising from the lower pole of the left lobe of the thyroid gland measuring approximately 2.4 cm, not significantly changed since 2006. Lungs/Pleura: Emphysematous changes throughout both lungs. Pleural based nodule with irregular margins in the lateral left lower lobe measuring approximately 1.5 x 1.2 cm (series 7, image 65) and adjacent subpleural nodule slightly anterior and inferior in the left lower lobe measuring approximately 0.9 x 0.7 cm (series 7, image 69), not present on the prior CT for on prior chest x-rays. No parenchymal nodules or masses elsewhere in either lung. No confluent airspace consolidation. No pleural effusions. Right  apical pleuroparenchymal scarring. Fluid/mucous within multiple right lower lobe segmental and subsegmental bronchi. Remaining central airways patent with tracheobronchial cartilage calcification. Upper Abdomen: Subcentimeter cysts arising from the upper pole of the left kidney. Approximate 1.4 cm nodule arising from the left adrenal gland, not imaged on the prior CT. Visualized upper abdomen otherwise unremarkable. Musculoskeletal: Osseous demineralization. Degenerative disc disease and spondylosis throughout the thoracic spine. No acute osseous abnormalities. Review of the MIP images confirms the above findings. IMPRESSION: 1. No evidence of pulmonary embolism. 2. Adjacent pleural based nodules in the lateral left lower lobe, the largest measuring 1.5 cm with irregular margins. Primary pulmonary malignancy is suspected. 3. Severe COPD/emphysema. Fluid/mucous plugging involving multiple segmental and subsegmental right lower lobe bronchi. No acute cardiopulmonary disease otherwise. 4. Moderate cardiomegaly. Extensive three-vessel coronary atherosclerosis. Severe thoracic and upper abdominal aortic atherosclerosis without aneurysm. 5. 1.4 cm nodule arising from the left adrenal gland, indeterminate. 6. Multinodular goiter, not significantly changed since 2006. Aortic Atherosclerosis (ICD10-I70.0) and Emphysema (ICD10-J43.9). Electronically Signed   By: Evangeline Dakin M.D.   On: 04/15/2017 18:19   Echo Study Conclusions  - Left ventricle: The cavity size was normal. Moderate asymmetric   septal hypertrophy. Systolic function was vigorous. The estimated   ejection fraction was in the range of 65% to 70%. Wall motion was   normal; there were no regional wall motion abnormalities. Peak LV   outflow tract gradient 22 mmHg. Doppler parameters are consistent   with abnormal left ventricular relaxation (grade 1 diastolic   dysfunction). - Mitral valve: There appeared to be mild mitral valve systolic    anterior motion on the apical 3-chamber view. Mildly calcified   annulus. There was no significant regurgitation. - Left atrium: The atrium was mildly dilated. - Right ventricle: The cavity size was normal. Systolic function   was normal. - Pulmonary arteries: No complete TR doppler jet so unable to   estimate PA systolic pressure. - Inferior vena cava: The vessel was normal in size. The   respirophasic diameter changes were in the normal range (>= 50%),   consistent with normal central venous pressure.  Impressions:  - Normal LV size with moderate asymmetric septal hypertrophy. There   was a mild LV outflow tract gradient to 22 mmHg peak. Normal RV   size and systolic function. In one view, there appeared to be a   degree of mitral valve systolic anterior motion. This could be   consistent with hypertrophic obstructive cardiomyopathy.   Discharge Exam: Vitals:   04/19/17 0124 04/19/17 0427  BP: (!) 108/56 (!) 115/43  Pulse: 81 86  Resp: 16   Temp: 98.3 F (36.8 C) 99.1 F (37.3  C)   Vitals:   04/19/17 0124 04/19/17 0427 04/19/17 0824 04/19/17 0831  BP: (!) 108/56 (!) 115/43    Pulse: 81 86    Resp: 16     Temp: 98.3 F (36.8 C) 99.1 F (37.3 C)    TempSrc: Oral Oral    SpO2: 97% 97% 96% 96%  Weight:      Height:        General: Pt is alert, awake, not in acute distress, eating breakfast  Cardiovascular: RRR, S1/S2 +, no rubs, no gallops Respiratory: CTA bilaterally, no wheezing, no rhonchi Abdominal: Soft, NT, ND, bowel sounds + Extremities: no edema, no cyanosis    The results of significant diagnostics from this hospitalization (including imaging, microbiology, ancillary and laboratory) are listed below for reference.     Microbiology: Recent Results (from the past 240 hour(s))  Urine culture     Status: Abnormal   Collection Time: 04/15/17 12:59 PM  Result Value Ref Range Status   Specimen Description URINE, CLEAN CATCH  Final   Special Requests NONE   Final   Culture MULTIPLE SPECIES PRESENT, SUGGEST RECOLLECTION (A)  Final   Report Status 04/16/2017 FINAL  Final     Labs: BNP (last 3 results)  Recent Labs  05/28/16 1509 04/15/17 1337  BNP 174.5* 937.1*   Basic Metabolic Panel:  Recent Labs Lab 04/15/17 1337 04/16/17 0735  NA 145 141  K 3.3* 4.3  CL 98* 99*  CO2 42* 34*  GLUCOSE 142* 94  BUN 17 16  CREATININE 0.58 0.53  CALCIUM 8.8* 8.9  MG  --  1.9   Liver Function Tests:  Recent Labs Lab 04/15/17 1337  AST 20  ALT 14  ALKPHOS 73  BILITOT 0.2*  PROT 7.1  ALBUMIN 3.7   No results for input(s): LIPASE, AMYLASE in the last 168 hours. No results for input(s): AMMONIA in the last 168 hours. CBC:  Recent Labs Lab 04/15/17 1337 04/16/17 0735 04/17/17 0516  WBC 8.3 8.9 11.0*  NEUTROABS 5.6  --   --   HGB 9.1* 8.5* 9.7*  HCT 32.6* 28.7* 32.7*  MCV 96.2 93.5 91.9  PLT 309 281 325   Cardiac Enzymes:  Recent Labs Lab 04/15/17 1337 04/15/17 2010 04/15/17 2148 04/16/17 0735  TROPONINI 3.07* 2.82* 2.65* 2.86*   BNP: Invalid input(s): POCBNP CBG: No results for input(s): GLUCAP in the last 168 hours. D-Dimer No results for input(s): DDIMER in the last 72 hours. Hgb A1c No results for input(s): HGBA1C in the last 72 hours. Lipid Profile No results for input(s): CHOL, HDL, LDLCALC, TRIG, CHOLHDL, LDLDIRECT in the last 72 hours. Thyroid function studies No results for input(s): TSH, T4TOTAL, T3FREE, THYROIDAB in the last 72 hours.  Invalid input(s): FREET3 Anemia work up No results for input(s): VITAMINB12, FOLATE, FERRITIN, TIBC, IRON, RETICCTPCT in the last 72 hours. Urinalysis    Component Value Date/Time   COLORURINE YELLOW 04/15/2017 1259   APPEARANCEUR HAZY (A) 04/15/2017 1259   LABSPEC 1.009 04/15/2017 1259   PHURINE 6.0 04/15/2017 1259   GLUCOSEU NEGATIVE 04/15/2017 1259   HGBUR NEGATIVE 04/15/2017 1259   BILIRUBINUR NEGATIVE 04/15/2017 1259   BILIRUBINUR negative 10/17/2016 1252    KETONESUR NEGATIVE 04/15/2017 1259   PROTEINUR NEGATIVE 04/15/2017 1259   UROBILINOGEN 1.0 10/17/2016 1252   UROBILINOGEN 1.0 07/27/2015 1413   NITRITE NEGATIVE 04/15/2017 1259   LEUKOCYTESUR MODERATE (A) 04/15/2017 1259   Sepsis Labs Invalid input(s): PROCALCITONIN,  WBC,  LACTICIDVEN Microbiology Recent Results (from the  past 240 hour(s))  Urine culture     Status: Abnormal   Collection Time: 04/15/17 12:59 PM  Result Value Ref Range Status   Specimen Description URINE, CLEAN CATCH  Final   Special Requests NONE  Final   Culture MULTIPLE SPECIES PRESENT, SUGGEST RECOLLECTION (A)  Final   Report Status 04/16/2017 FINAL  Final     Time coordinating discharge: 40 minutes  SIGNED:  Dessa Phi, DO [facility] Pager [phone]  If 7PM-7AM, please contact night-coverage [email or web address] Password [email or web address] 04/19/2017, 11:53 AM

## 2017-04-19 NOTE — Care Management Important Message (Signed)
Important Message  Patient Details  Name: [name] MRN: [id number] Date of Birth: 1924/07/30   Medicare Important Message Given:  Yes    Erenest Rasher, RN 04/19/2017, 1:40 PM

## 2017-04-19 NOTE — Clinical Social Work Placement (Signed)
  CLINICAL SOCIAL WORK PLACEMENT  NOTE  Date:  04/19/2017  Patient Details  Name: [name] MRN: [id number] Date of Birth: 01-26-1924  Clinical Social Work is seeking post-discharge placement for this patient at the [place] level of care (*CSW will initial, date and re-position this form in  chart as items are completed):      Patient/family provided with [place] Work Department's list of facilities offering this level of care within the geographic area requested by the patient (or if unable, by the patient's family).  Yes   Patient/family informed of their freedom to choose among providers that offer the needed level of care, that participate in Medicare, Medicaid or managed care program needed by the patient, have an available bed and are willing to accept the patient.  Yes   Patient/family informed of [facility]'s ownership interest in [facility] and [facility], as well as of the fact that they are under no obligation to receive care at these facilities.  PASRR submitted to EDS on       PASRR number received on       Existing PASRR number confirmed on 04/17/17     FL2 transmitted to all facilities in geographic area requested by pt/family on 04/17/17     FL2 transmitted to all facilities within larger geographic area on       Patient informed that his/her managed care company has contracts with or will negotiate with certain facilities, including the following:        Yes   Patient/family informed of bed offers received.  Patient chooses bed at [facility]     Physician recommends and patient chooses bed at      Patient to be transferred to [facility] on 04/19/17.  Patient to be transferred to facility by [facility]     Patient family notified on 04/19/17 of transfer.  Name of family member notified:  Son-James Hedrick     PHYSICIAN Please prepare prescriptions, Please prepare priority discharge summary, including  medications     Additional Comment:  Pt ready for discharge and will transport to [place]. CSW confirmed bed with Helene Kelp (admissions) and sent clinicals via the hub. CSW also updated son-James Kary Kos [phone]) and confirmed transportation is [facility]. CSW provided RN with room and report and added to treatment team sticky note. CSW arranged transportation with [facility]-spoke with badge [phone] at 2:57p. Luis Lopez signing off as no further Social Work needs identified.   _______________________________________________ Truitt Merle, LCSW 04/19/2017, 3:29 PM

## 2017-04-21 DIAGNOSIS — J984 Other disorders of lung: Secondary | ICD-10-CM | POA: Diagnosis not present

## 2017-04-21 DIAGNOSIS — R5381 Other malaise: Secondary | ICD-10-CM | POA: Diagnosis not present

## 2017-04-21 DIAGNOSIS — I5032 Chronic diastolic (congestive) heart failure: Secondary | ICD-10-CM | POA: Diagnosis not present

## 2017-04-21 DIAGNOSIS — I2510 Atherosclerotic heart disease of native coronary artery without angina pectoris: Secondary | ICD-10-CM | POA: Diagnosis not present

## 2017-04-30 DIAGNOSIS — I5032 Chronic diastolic (congestive) heart failure: Secondary | ICD-10-CM | POA: Diagnosis not present

## 2017-04-30 DIAGNOSIS — I10 Essential (primary) hypertension: Secondary | ICD-10-CM | POA: Diagnosis not present

## 2017-04-30 DIAGNOSIS — J984 Other disorders of lung: Secondary | ICD-10-CM | POA: Diagnosis not present

## 2017-04-30 DIAGNOSIS — I2510 Atherosclerotic heart disease of native coronary artery without angina pectoris: Secondary | ICD-10-CM | POA: Diagnosis not present

## 2017-05-07 DIAGNOSIS — R918 Other nonspecific abnormal finding of lung field: Secondary | ICD-10-CM | POA: Diagnosis not present

## 2017-05-09 DIAGNOSIS — I214 Non-ST elevation (NSTEMI) myocardial infarction: Secondary | ICD-10-CM | POA: Diagnosis not present

## 2017-05-09 DIAGNOSIS — N390 Urinary tract infection, site not specified: Secondary | ICD-10-CM | POA: Diagnosis not present

## 2017-05-09 DIAGNOSIS — G301 Alzheimer's disease with late onset: Secondary | ICD-10-CM | POA: Diagnosis not present

## 2017-05-09 DIAGNOSIS — R2681 Unsteadiness on feet: Secondary | ICD-10-CM | POA: Diagnosis not present

## 2017-05-09 DIAGNOSIS — R2689 Other abnormalities of gait and mobility: Secondary | ICD-10-CM | POA: Diagnosis not present

## 2017-05-09 DIAGNOSIS — M6281 Muscle weakness (generalized): Secondary | ICD-10-CM | POA: Diagnosis not present

## 2017-05-12 DIAGNOSIS — I214 Non-ST elevation (NSTEMI) myocardial infarction: Secondary | ICD-10-CM | POA: Diagnosis not present

## 2017-05-12 DIAGNOSIS — R2681 Unsteadiness on feet: Secondary | ICD-10-CM | POA: Diagnosis not present

## 2017-05-12 DIAGNOSIS — R2689 Other abnormalities of gait and mobility: Secondary | ICD-10-CM | POA: Diagnosis not present

## 2017-05-12 DIAGNOSIS — M6281 Muscle weakness (generalized): Secondary | ICD-10-CM | POA: Diagnosis not present

## 2017-05-13 DIAGNOSIS — I214 Non-ST elevation (NSTEMI) myocardial infarction: Secondary | ICD-10-CM | POA: Diagnosis not present

## 2017-05-13 DIAGNOSIS — M6281 Muscle weakness (generalized): Secondary | ICD-10-CM | POA: Diagnosis not present

## 2017-05-13 DIAGNOSIS — R2689 Other abnormalities of gait and mobility: Secondary | ICD-10-CM | POA: Diagnosis not present

## 2017-05-13 DIAGNOSIS — R2681 Unsteadiness on feet: Secondary | ICD-10-CM | POA: Diagnosis not present

## 2017-05-14 DIAGNOSIS — R2689 Other abnormalities of gait and mobility: Secondary | ICD-10-CM | POA: Diagnosis not present

## 2017-05-14 DIAGNOSIS — M6281 Muscle weakness (generalized): Secondary | ICD-10-CM | POA: Diagnosis not present

## 2017-05-14 DIAGNOSIS — I214 Non-ST elevation (NSTEMI) myocardial infarction: Secondary | ICD-10-CM | POA: Diagnosis not present

## 2017-05-14 DIAGNOSIS — R2681 Unsteadiness on feet: Secondary | ICD-10-CM | POA: Diagnosis not present

## 2017-05-14 DIAGNOSIS — I10 Essential (primary) hypertension: Secondary | ICD-10-CM | POA: Diagnosis not present

## 2017-05-15 DIAGNOSIS — I214 Non-ST elevation (NSTEMI) myocardial infarction: Secondary | ICD-10-CM | POA: Diagnosis not present

## 2017-05-15 DIAGNOSIS — R2689 Other abnormalities of gait and mobility: Secondary | ICD-10-CM | POA: Diagnosis not present

## 2017-05-15 DIAGNOSIS — R2681 Unsteadiness on feet: Secondary | ICD-10-CM | POA: Diagnosis not present

## 2017-05-15 DIAGNOSIS — M6281 Muscle weakness (generalized): Secondary | ICD-10-CM | POA: Diagnosis not present

## 2017-05-16 DIAGNOSIS — R2689 Other abnormalities of gait and mobility: Secondary | ICD-10-CM | POA: Diagnosis not present

## 2017-05-16 DIAGNOSIS — M6281 Muscle weakness (generalized): Secondary | ICD-10-CM | POA: Diagnosis not present

## 2017-05-16 DIAGNOSIS — I214 Non-ST elevation (NSTEMI) myocardial infarction: Secondary | ICD-10-CM | POA: Diagnosis not present

## 2017-05-16 DIAGNOSIS — R2681 Unsteadiness on feet: Secondary | ICD-10-CM | POA: Diagnosis not present

## 2017-05-19 DIAGNOSIS — R2681 Unsteadiness on feet: Secondary | ICD-10-CM | POA: Diagnosis not present

## 2017-05-19 DIAGNOSIS — I214 Non-ST elevation (NSTEMI) myocardial infarction: Secondary | ICD-10-CM | POA: Diagnosis not present

## 2017-05-19 DIAGNOSIS — R2689 Other abnormalities of gait and mobility: Secondary | ICD-10-CM | POA: Diagnosis not present

## 2017-05-19 DIAGNOSIS — I10 Essential (primary) hypertension: Secondary | ICD-10-CM | POA: Diagnosis not present

## 2017-05-19 DIAGNOSIS — M6281 Muscle weakness (generalized): Secondary | ICD-10-CM | POA: Diagnosis not present

## 2017-05-19 DIAGNOSIS — G301 Alzheimer's disease with late onset: Secondary | ICD-10-CM | POA: Diagnosis not present

## 2017-05-20 DIAGNOSIS — R2681 Unsteadiness on feet: Secondary | ICD-10-CM | POA: Diagnosis not present

## 2017-05-20 DIAGNOSIS — I214 Non-ST elevation (NSTEMI) myocardial infarction: Secondary | ICD-10-CM | POA: Diagnosis not present

## 2017-05-20 DIAGNOSIS — R2689 Other abnormalities of gait and mobility: Secondary | ICD-10-CM | POA: Diagnosis not present

## 2017-05-20 DIAGNOSIS — M6281 Muscle weakness (generalized): Secondary | ICD-10-CM | POA: Diagnosis not present

## 2017-05-21 DIAGNOSIS — J449 Chronic obstructive pulmonary disease, unspecified: Secondary | ICD-10-CM | POA: Diagnosis not present

## 2017-05-21 DIAGNOSIS — M6281 Muscle weakness (generalized): Secondary | ICD-10-CM | POA: Diagnosis not present

## 2017-05-21 DIAGNOSIS — I214 Non-ST elevation (NSTEMI) myocardial infarction: Secondary | ICD-10-CM | POA: Diagnosis not present

## 2017-05-22 DIAGNOSIS — I214 Non-ST elevation (NSTEMI) myocardial infarction: Secondary | ICD-10-CM | POA: Diagnosis not present

## 2017-05-22 DIAGNOSIS — M6281 Muscle weakness (generalized): Secondary | ICD-10-CM | POA: Diagnosis not present

## 2017-05-23 DIAGNOSIS — I214 Non-ST elevation (NSTEMI) myocardial infarction: Secondary | ICD-10-CM | POA: Diagnosis not present

## 2017-05-23 DIAGNOSIS — M6281 Muscle weakness (generalized): Secondary | ICD-10-CM | POA: Diagnosis not present

## 2017-05-26 DIAGNOSIS — M6281 Muscle weakness (generalized): Secondary | ICD-10-CM | POA: Diagnosis not present

## 2017-05-26 DIAGNOSIS — I214 Non-ST elevation (NSTEMI) myocardial infarction: Secondary | ICD-10-CM | POA: Diagnosis not present

## 2017-05-27 DIAGNOSIS — I214 Non-ST elevation (NSTEMI) myocardial infarction: Secondary | ICD-10-CM | POA: Diagnosis not present

## 2017-05-27 DIAGNOSIS — R0602 Shortness of breath: Secondary | ICD-10-CM | POA: Diagnosis not present

## 2017-05-27 DIAGNOSIS — M6281 Muscle weakness (generalized): Secondary | ICD-10-CM | POA: Diagnosis not present

## 2017-05-28 DIAGNOSIS — I214 Non-ST elevation (NSTEMI) myocardial infarction: Secondary | ICD-10-CM | POA: Diagnosis not present

## 2017-05-28 DIAGNOSIS — M6281 Muscle weakness (generalized): Secondary | ICD-10-CM | POA: Diagnosis not present

## 2017-05-29 DIAGNOSIS — I214 Non-ST elevation (NSTEMI) myocardial infarction: Secondary | ICD-10-CM | POA: Diagnosis not present

## 2017-05-29 DIAGNOSIS — M6281 Muscle weakness (generalized): Secondary | ICD-10-CM | POA: Diagnosis not present

## 2017-05-30 DIAGNOSIS — M6281 Muscle weakness (generalized): Secondary | ICD-10-CM | POA: Diagnosis not present

## 2017-05-30 DIAGNOSIS — I214 Non-ST elevation (NSTEMI) myocardial infarction: Secondary | ICD-10-CM | POA: Diagnosis not present

## 2017-05-31 DIAGNOSIS — J449 Chronic obstructive pulmonary disease, unspecified: Secondary | ICD-10-CM | POA: Diagnosis not present

## 2017-06-02 DIAGNOSIS — M6281 Muscle weakness (generalized): Secondary | ICD-10-CM | POA: Diagnosis not present

## 2017-06-02 DIAGNOSIS — I214 Non-ST elevation (NSTEMI) myocardial infarction: Secondary | ICD-10-CM | POA: Diagnosis not present

## 2017-06-03 DIAGNOSIS — I214 Non-ST elevation (NSTEMI) myocardial infarction: Secondary | ICD-10-CM | POA: Diagnosis not present

## 2017-06-03 DIAGNOSIS — M6281 Muscle weakness (generalized): Secondary | ICD-10-CM | POA: Diagnosis not present

## 2017-06-04 DIAGNOSIS — I214 Non-ST elevation (NSTEMI) myocardial infarction: Secondary | ICD-10-CM | POA: Diagnosis not present

## 2017-06-04 DIAGNOSIS — M6281 Muscle weakness (generalized): Secondary | ICD-10-CM | POA: Diagnosis not present

## 2017-06-05 DIAGNOSIS — M6281 Muscle weakness (generalized): Secondary | ICD-10-CM | POA: Diagnosis not present

## 2017-06-05 DIAGNOSIS — I214 Non-ST elevation (NSTEMI) myocardial infarction: Secondary | ICD-10-CM | POA: Diagnosis not present

## 2017-06-06 DIAGNOSIS — M6281 Muscle weakness (generalized): Secondary | ICD-10-CM | POA: Diagnosis not present

## 2017-06-06 DIAGNOSIS — I214 Non-ST elevation (NSTEMI) myocardial infarction: Secondary | ICD-10-CM | POA: Diagnosis not present

## 2017-06-09 DIAGNOSIS — M6281 Muscle weakness (generalized): Secondary | ICD-10-CM | POA: Diagnosis not present

## 2017-06-09 DIAGNOSIS — I214 Non-ST elevation (NSTEMI) myocardial infarction: Secondary | ICD-10-CM | POA: Diagnosis not present

## 2017-06-10 DIAGNOSIS — M6281 Muscle weakness (generalized): Secondary | ICD-10-CM | POA: Diagnosis not present

## 2017-06-10 DIAGNOSIS — I214 Non-ST elevation (NSTEMI) myocardial infarction: Secondary | ICD-10-CM | POA: Diagnosis not present

## 2017-06-11 DIAGNOSIS — G300 Alzheimer's disease with early onset: Secondary | ICD-10-CM | POA: Diagnosis not present

## 2017-06-11 DIAGNOSIS — I10 Essential (primary) hypertension: Secondary | ICD-10-CM | POA: Diagnosis not present

## 2017-06-11 DIAGNOSIS — I5032 Chronic diastolic (congestive) heart failure: Secondary | ICD-10-CM | POA: Diagnosis not present

## 2017-06-11 DIAGNOSIS — M6281 Muscle weakness (generalized): Secondary | ICD-10-CM | POA: Diagnosis not present

## 2017-06-11 DIAGNOSIS — I2510 Atherosclerotic heart disease of native coronary artery without angina pectoris: Secondary | ICD-10-CM | POA: Diagnosis not present

## 2017-06-11 DIAGNOSIS — I214 Non-ST elevation (NSTEMI) myocardial infarction: Secondary | ICD-10-CM | POA: Diagnosis not present

## 2017-06-13 DIAGNOSIS — I214 Non-ST elevation (NSTEMI) myocardial infarction: Secondary | ICD-10-CM | POA: Diagnosis not present

## 2017-06-13 DIAGNOSIS — M6281 Muscle weakness (generalized): Secondary | ICD-10-CM | POA: Diagnosis not present

## 2017-06-14 DIAGNOSIS — I214 Non-ST elevation (NSTEMI) myocardial infarction: Secondary | ICD-10-CM | POA: Diagnosis not present

## 2017-06-14 DIAGNOSIS — M6281 Muscle weakness (generalized): Secondary | ICD-10-CM | POA: Diagnosis not present

## 2017-06-15 DIAGNOSIS — I214 Non-ST elevation (NSTEMI) myocardial infarction: Secondary | ICD-10-CM | POA: Diagnosis not present

## 2017-06-15 DIAGNOSIS — M6281 Muscle weakness (generalized): Secondary | ICD-10-CM | POA: Diagnosis not present

## 2017-06-16 DIAGNOSIS — M6281 Muscle weakness (generalized): Secondary | ICD-10-CM | POA: Diagnosis not present

## 2017-06-16 DIAGNOSIS — I214 Non-ST elevation (NSTEMI) myocardial infarction: Secondary | ICD-10-CM | POA: Diagnosis not present

## 2017-06-17 DIAGNOSIS — I214 Non-ST elevation (NSTEMI) myocardial infarction: Secondary | ICD-10-CM | POA: Diagnosis not present

## 2017-06-17 DIAGNOSIS — M6281 Muscle weakness (generalized): Secondary | ICD-10-CM | POA: Diagnosis not present

## 2017-06-18 DIAGNOSIS — I214 Non-ST elevation (NSTEMI) myocardial infarction: Secondary | ICD-10-CM | POA: Diagnosis not present

## 2017-06-18 DIAGNOSIS — M6281 Muscle weakness (generalized): Secondary | ICD-10-CM | POA: Diagnosis not present

## 2017-06-19 DIAGNOSIS — M6281 Muscle weakness (generalized): Secondary | ICD-10-CM | POA: Diagnosis not present

## 2017-06-19 DIAGNOSIS — I214 Non-ST elevation (NSTEMI) myocardial infarction: Secondary | ICD-10-CM | POA: Diagnosis not present

## 2017-06-20 DIAGNOSIS — M6281 Muscle weakness (generalized): Secondary | ICD-10-CM | POA: Diagnosis not present

## 2017-06-20 DIAGNOSIS — I214 Non-ST elevation (NSTEMI) myocardial infarction: Secondary | ICD-10-CM | POA: Diagnosis not present

## 2017-06-21 DIAGNOSIS — J449 Chronic obstructive pulmonary disease, unspecified: Secondary | ICD-10-CM | POA: Diagnosis not present

## 2017-06-21 DIAGNOSIS — I214 Non-ST elevation (NSTEMI) myocardial infarction: Secondary | ICD-10-CM | POA: Diagnosis not present

## 2017-06-23 DIAGNOSIS — I214 Non-ST elevation (NSTEMI) myocardial infarction: Secondary | ICD-10-CM | POA: Diagnosis not present

## 2017-06-24 DIAGNOSIS — I214 Non-ST elevation (NSTEMI) myocardial infarction: Secondary | ICD-10-CM | POA: Diagnosis not present

## 2017-06-25 DIAGNOSIS — I214 Non-ST elevation (NSTEMI) myocardial infarction: Secondary | ICD-10-CM | POA: Diagnosis not present

## 2017-06-26 DIAGNOSIS — I214 Non-ST elevation (NSTEMI) myocardial infarction: Secondary | ICD-10-CM | POA: Diagnosis not present

## 2017-06-27 DIAGNOSIS — I214 Non-ST elevation (NSTEMI) myocardial infarction: Secondary | ICD-10-CM | POA: Diagnosis not present

## 2017-06-28 DIAGNOSIS — I214 Non-ST elevation (NSTEMI) myocardial infarction: Secondary | ICD-10-CM | POA: Diagnosis not present

## 2017-06-30 DIAGNOSIS — I214 Non-ST elevation (NSTEMI) myocardial infarction: Secondary | ICD-10-CM | POA: Diagnosis not present

## 2017-07-01 DIAGNOSIS — I214 Non-ST elevation (NSTEMI) myocardial infarction: Secondary | ICD-10-CM | POA: Diagnosis not present

## 2017-07-01 DIAGNOSIS — J449 Chronic obstructive pulmonary disease, unspecified: Secondary | ICD-10-CM | POA: Diagnosis not present

## 2017-07-02 DIAGNOSIS — I214 Non-ST elevation (NSTEMI) myocardial infarction: Secondary | ICD-10-CM | POA: Diagnosis not present

## 2017-07-03 DIAGNOSIS — I214 Non-ST elevation (NSTEMI) myocardial infarction: Secondary | ICD-10-CM | POA: Diagnosis not present

## 2017-07-04 DIAGNOSIS — I10 Essential (primary) hypertension: Secondary | ICD-10-CM | POA: Diagnosis not present

## 2017-07-04 DIAGNOSIS — I5032 Chronic diastolic (congestive) heart failure: Secondary | ICD-10-CM | POA: Diagnosis not present

## 2017-07-04 DIAGNOSIS — I214 Non-ST elevation (NSTEMI) myocardial infarction: Secondary | ICD-10-CM | POA: Diagnosis not present

## 2017-07-04 DIAGNOSIS — J984 Other disorders of lung: Secondary | ICD-10-CM | POA: Diagnosis not present

## 2017-07-04 DIAGNOSIS — D6489 Other specified anemias: Secondary | ICD-10-CM | POA: Diagnosis not present

## 2017-07-06 DIAGNOSIS — I214 Non-ST elevation (NSTEMI) myocardial infarction: Secondary | ICD-10-CM | POA: Diagnosis not present

## 2017-07-07 DIAGNOSIS — I214 Non-ST elevation (NSTEMI) myocardial infarction: Secondary | ICD-10-CM | POA: Diagnosis not present

## 2017-07-09 DIAGNOSIS — I214 Non-ST elevation (NSTEMI) myocardial infarction: Secondary | ICD-10-CM | POA: Diagnosis not present

## 2017-07-10 DIAGNOSIS — I214 Non-ST elevation (NSTEMI) myocardial infarction: Secondary | ICD-10-CM | POA: Diagnosis not present

## 2017-07-11 DIAGNOSIS — I214 Non-ST elevation (NSTEMI) myocardial infarction: Secondary | ICD-10-CM | POA: Diagnosis not present

## 2017-07-12 DIAGNOSIS — I214 Non-ST elevation (NSTEMI) myocardial infarction: Secondary | ICD-10-CM | POA: Diagnosis not present

## 2017-07-14 DIAGNOSIS — I214 Non-ST elevation (NSTEMI) myocardial infarction: Secondary | ICD-10-CM | POA: Diagnosis not present

## 2017-07-16 DIAGNOSIS — I214 Non-ST elevation (NSTEMI) myocardial infarction: Secondary | ICD-10-CM | POA: Diagnosis not present

## 2017-07-16 DIAGNOSIS — M79675 Pain in left toe(s): Secondary | ICD-10-CM | POA: Diagnosis not present

## 2017-07-16 DIAGNOSIS — B351 Tinea unguium: Secondary | ICD-10-CM | POA: Diagnosis not present

## 2017-07-17 DIAGNOSIS — I214 Non-ST elevation (NSTEMI) myocardial infarction: Secondary | ICD-10-CM | POA: Diagnosis not present

## 2017-07-19 DIAGNOSIS — I214 Non-ST elevation (NSTEMI) myocardial infarction: Secondary | ICD-10-CM | POA: Diagnosis not present

## 2017-07-21 DIAGNOSIS — J449 Chronic obstructive pulmonary disease, unspecified: Secondary | ICD-10-CM | POA: Diagnosis not present

## 2017-07-21 DIAGNOSIS — I214 Non-ST elevation (NSTEMI) myocardial infarction: Secondary | ICD-10-CM | POA: Diagnosis not present

## 2017-07-22 DIAGNOSIS — I214 Non-ST elevation (NSTEMI) myocardial infarction: Secondary | ICD-10-CM | POA: Diagnosis not present

## 2017-07-23 DIAGNOSIS — I214 Non-ST elevation (NSTEMI) myocardial infarction: Secondary | ICD-10-CM | POA: Diagnosis not present

## 2017-07-24 DIAGNOSIS — I214 Non-ST elevation (NSTEMI) myocardial infarction: Secondary | ICD-10-CM | POA: Diagnosis not present

## 2017-07-25 DIAGNOSIS — I214 Non-ST elevation (NSTEMI) myocardial infarction: Secondary | ICD-10-CM | POA: Diagnosis not present

## 2017-07-30 DIAGNOSIS — I214 Non-ST elevation (NSTEMI) myocardial infarction: Secondary | ICD-10-CM | POA: Diagnosis not present

## 2017-07-31 ENCOUNTER — Other Ambulatory Visit: Payer: Self-pay | Admitting: Cardiovascular Disease

## 2017-07-31 DIAGNOSIS — I214 Non-ST elevation (NSTEMI) myocardial infarction: Secondary | ICD-10-CM | POA: Diagnosis not present

## 2017-07-31 DIAGNOSIS — J449 Chronic obstructive pulmonary disease, unspecified: Secondary | ICD-10-CM | POA: Diagnosis not present

## 2017-07-31 NOTE — Telephone Encounter (Signed)
REFILL 

## 2017-08-01 DIAGNOSIS — I214 Non-ST elevation (NSTEMI) myocardial infarction: Secondary | ICD-10-CM | POA: Diagnosis not present

## 2017-08-21 DIAGNOSIS — J449 Chronic obstructive pulmonary disease, unspecified: Secondary | ICD-10-CM | POA: Diagnosis not present

## 2017-08-22 DIAGNOSIS — D6489 Other specified anemias: Secondary | ICD-10-CM | POA: Diagnosis not present

## 2017-08-22 DIAGNOSIS — J984 Other disorders of lung: Secondary | ICD-10-CM | POA: Diagnosis not present

## 2017-08-22 DIAGNOSIS — G308 Other Alzheimer's disease: Secondary | ICD-10-CM | POA: Diagnosis not present

## 2017-08-22 DIAGNOSIS — I10 Essential (primary) hypertension: Secondary | ICD-10-CM | POA: Diagnosis not present

## 2017-08-29 DIAGNOSIS — J984 Other disorders of lung: Secondary | ICD-10-CM | POA: Diagnosis not present

## 2017-08-29 DIAGNOSIS — I10 Essential (primary) hypertension: Secondary | ICD-10-CM | POA: Diagnosis not present

## 2017-08-29 DIAGNOSIS — R0602 Shortness of breath: Secondary | ICD-10-CM | POA: Diagnosis not present

## 2017-08-31 DIAGNOSIS — J449 Chronic obstructive pulmonary disease, unspecified: Secondary | ICD-10-CM | POA: Diagnosis not present

## 2017-09-09 DIAGNOSIS — I10 Essential (primary) hypertension: Secondary | ICD-10-CM | POA: Diagnosis not present

## 2017-09-09 DIAGNOSIS — J984 Other disorders of lung: Secondary | ICD-10-CM | POA: Diagnosis not present

## 2017-09-09 DIAGNOSIS — N390 Urinary tract infection, site not specified: Secondary | ICD-10-CM | POA: Diagnosis not present

## 2017-09-10 DIAGNOSIS — N390 Urinary tract infection, site not specified: Secondary | ICD-10-CM | POA: Diagnosis not present

## 2017-09-17 DIAGNOSIS — G300 Alzheimer's disease with early onset: Secondary | ICD-10-CM | POA: Diagnosis not present

## 2017-09-17 DIAGNOSIS — I10 Essential (primary) hypertension: Secondary | ICD-10-CM | POA: Diagnosis not present

## 2017-09-17 DIAGNOSIS — I5032 Chronic diastolic (congestive) heart failure: Secondary | ICD-10-CM | POA: Diagnosis not present

## 2017-09-17 DIAGNOSIS — D6489 Other specified anemias: Secondary | ICD-10-CM | POA: Diagnosis not present

## 2017-09-20 DIAGNOSIS — J449 Chronic obstructive pulmonary disease, unspecified: Secondary | ICD-10-CM | POA: Diagnosis not present

## 2017-09-30 DIAGNOSIS — J449 Chronic obstructive pulmonary disease, unspecified: Secondary | ICD-10-CM | POA: Diagnosis not present

## 2017-10-01 DIAGNOSIS — M79675 Pain in left toe(s): Secondary | ICD-10-CM | POA: Diagnosis not present

## 2017-10-01 DIAGNOSIS — M79674 Pain in right toe(s): Secondary | ICD-10-CM | POA: Diagnosis not present

## 2017-10-01 DIAGNOSIS — B351 Tinea unguium: Secondary | ICD-10-CM | POA: Diagnosis not present

## 2017-10-06 DIAGNOSIS — Z2821 Immunization not carried out because of patient refusal: Secondary | ICD-10-CM | POA: Diagnosis not present

## 2017-10-06 DIAGNOSIS — G300 Alzheimer's disease with early onset: Secondary | ICD-10-CM | POA: Diagnosis not present

## 2017-10-21 DIAGNOSIS — J449 Chronic obstructive pulmonary disease, unspecified: Secondary | ICD-10-CM | POA: Diagnosis not present

## 2017-10-27 DIAGNOSIS — J984 Other disorders of lung: Secondary | ICD-10-CM | POA: Diagnosis not present

## 2017-10-31 DIAGNOSIS — J449 Chronic obstructive pulmonary disease, unspecified: Secondary | ICD-10-CM | POA: Diagnosis not present

## 2017-11-17 DIAGNOSIS — N390 Urinary tract infection, site not specified: Secondary | ICD-10-CM | POA: Diagnosis not present

## 2017-11-21 DIAGNOSIS — J449 Chronic obstructive pulmonary disease, unspecified: Secondary | ICD-10-CM | POA: Diagnosis not present

## 2017-12-01 DIAGNOSIS — J449 Chronic obstructive pulmonary disease, unspecified: Secondary | ICD-10-CM | POA: Diagnosis not present

## 2017-12-19 DIAGNOSIS — J449 Chronic obstructive pulmonary disease, unspecified: Secondary | ICD-10-CM | POA: Diagnosis not present

## 2017-12-29 DIAGNOSIS — J449 Chronic obstructive pulmonary disease, unspecified: Secondary | ICD-10-CM | POA: Diagnosis not present

## 2019-01-01 ENCOUNTER — Emergency Department (HOSPITAL_COMMUNITY)
Admission: EM | Admit: 2019-01-01 | Discharge: 2019-01-01 | Disposition: A | Discharge status: Home / Self Care | Attending: Emergency Medicine | Admitting: Emergency Medicine

## 2019-01-01 ENCOUNTER — Emergency Department (HOSPITAL_COMMUNITY)

## 2019-01-01 ENCOUNTER — Encounter (HOSPITAL_COMMUNITY): Payer: Self-pay | Admitting: Emergency Medicine

## 2019-01-01 DIAGNOSIS — W06XXXA Fall from bed, initial encounter: Secondary | ICD-10-CM | POA: Diagnosis not present

## 2019-01-01 DIAGNOSIS — F17210 Nicotine dependence, cigarettes, uncomplicated: Secondary | ICD-10-CM | POA: Insufficient documentation

## 2019-01-01 DIAGNOSIS — G309 Alzheimer's disease, unspecified: Secondary | ICD-10-CM | POA: Diagnosis not present

## 2019-01-01 DIAGNOSIS — F0280 Dementia in other diseases classified elsewhere without behavioral disturbance: Secondary | ICD-10-CM | POA: Insufficient documentation

## 2019-01-01 DIAGNOSIS — W19XXXA Unspecified fall, initial encounter: Secondary | ICD-10-CM

## 2019-01-01 DIAGNOSIS — J449 Chronic obstructive pulmonary disease, unspecified: Secondary | ICD-10-CM | POA: Insufficient documentation

## 2019-01-01 DIAGNOSIS — I5032 Chronic diastolic (congestive) heart failure: Secondary | ICD-10-CM | POA: Diagnosis not present

## 2019-01-01 DIAGNOSIS — R4182 Altered mental status, unspecified: Secondary | ICD-10-CM | POA: Diagnosis not present

## 2019-01-01 DIAGNOSIS — I110 Hypertensive heart disease with heart failure: Secondary | ICD-10-CM | POA: Diagnosis not present

## 2019-01-01 NOTE — ED Notes (Signed)
Attempted to call report to [employer]

## 2019-01-01 NOTE — ED Provider Notes (Signed)
[facility] EMERGENCY DEPARTMENT Provider Note   CSN: [id number] Arrival date & time: 01/01/19  2671    History   Chief Complaint Chief Complaint  Patient presents with  . Fall    HPI [name] is a 85 y.o. female.     The history is provided by the EMS personnel and medical records. The history is limited by the condition of the patient.  Fall  This is a new problem. The current episode started less than 1 hour ago. The problem occurs constantly. The problem has not changed since onset.Pertinent negatives include no chest pain, no abdominal pain and no shortness of breath. Nothing aggravates the symptoms. Nothing relieves the symptoms. She has tried nothing for the symptoms. The treatment provided no relief.    Past Medical History:  Diagnosis Date  . Cataract   . Chest pain   . COPD (chronic obstructive pulmonary disease) ([place])   . Debility 01/21/2016  . Hearing loss 01/21/2016  . Hyperglycemia 01/21/2016  . Hyperlipidemia   . Hypertension   . Left bundle branch block   . Macular degeneration 01/21/2016  . Major depression 01/29/2016  . Memory loss 01/21/2016  . Paroxysmal atrial fibrillation ([facility])   . Personal history of subdural hematoma   . Subdural hematoma ([place]) 2009  . Tobacco abuse     Patient Active Problem List   Diagnosis Date Noted  . Palliative care by specialist   . Goals of care, counseling/discussion   . NSTEMI (non-ST elevation myocardial infarction) ([place]) 04/15/2017  . Hypokalemia   . COPD with chronic bronchitis ([place])   . Chronic respiratory failure with hypoxia ([place])   . Symptomatic anemia 07/01/2016  . Chronic diastolic CHF (congestive heart failure) ([place]) 07/01/2016  . GERD (gastroesophageal reflux disease) 02/12/2016  . Decubitus ulcer of sacral region, stage 2 ([place]) 01/29/2016  . Major depression 01/29/2016  . Hyperglycemia 01/21/2016  . Hearing loss 01/21/2016  . Macular degeneration 01/21/2016  . Memory loss  01/21/2016  . Anemia 01/21/2016  . Debility 01/21/2016  . Pressure ulcer 01/15/2016  . CAP (community acquired pneumonia) 01/14/2016  . COPD exacerbation ([place]) 01/14/2016  . Acute on chronic respiratory failure with hypoxia ([place]) 01/14/2016  . Leukocytosis 01/14/2016  . Community acquired pneumonia 01/14/2016  . Paroxysmal atrial fibrillation ([place]) 07/30/2013  . Tobacco abuse 07/30/2013  . Hyperlipidemia 07/30/2013  . Essential hypertension 07/30/2013  . Family history of heart disease 07/30/2013  . Dyspnea on exertion 07/30/2013  . COPD (chronic obstructive pulmonary disease) ([place]) 04/06/2012    Past Surgical History:  Procedure Laterality Date  . ABDOMINAL HYSTERECTOMY    . APPENDECTOMY    . BRAIN SURGERY    . EYE SURGERY       OB History   No obstetric history on file.      Home Medications    Prior to Admission medications   Medication Sig Start Date End Date Taking? Authorizing Provider  acetaminophen (TYLENOL) 500 MG tablet Take 500 mg by mouth 3 (three) times daily.   Yes [provider]  albuterol (PROVENTIL HFA;VENTOLIN HFA) 108 (90 BASE) MCG/ACT inhaler Inhale 2 puffs into the lungs every 6 (six) hours as needed for wheezing or shortness of breath.    Yes [provider]  albuterol (PROVENTIL) (2.5 MG/3ML) 0.083% nebulizer solution Take 2.5 mg by nebulization 4 (four) times daily.   Yes [provider]  budesonide (PULMICORT) 0.5 MG/2ML nebulizer solution Take 0.5 mg by nebulization 2 (two) times  daily.   Yes [provider]  diazepam (VALIUM) 5 MG tablet Take 5 mg by mouth at bedtime.   Yes [provider]  Melatonin 3 MG TABS Take 3 mg by mouth at bedtime.   Yes [provider]  Nutritional Supplements (RESOURCE 2.0 PO) Take 120 mLs by mouth 2 (two) times daily.   Yes [provider]  OXYGEN Inhale 2 L into the lungs at bedtime. Reported on 12/04/2015   Yes [provider]  senna-docusate  (SENOKOT-S) 8.6-50 MG tablet Take 2 tablets by mouth 2 (two) times daily.   Yes [provider]  acetaminophen (TYLENOL) 325 MG tablet Take 2 tablets (650 mg total) by mouth every 6 (six) hours as needed for mild pain (or Fever >/= 101). Patient not taking: Reported on 01/01/2019 07/04/16   Robbie Lis, MD  ferrous sulfate 325 (65 FE) MG tablet Take 1 tablet (325 mg total) by mouth daily with breakfast. Patient not taking: Reported on 01/01/2019 07/05/16   Robbie Lis, MD  furosemide (LASIX) 20 MG tablet Take 1 tablet (20 mg total) by mouth daily as needed for fluid or edema. NEED OV. Patient not taking: Reported on 01/01/2019 07/31/17 10/29/17  Lorretta Harp, MD  meclizine (ANTIVERT) 12.5 MG tablet Take 1 tablet (12.5 mg total) by mouth 3 (three) times daily. Patient not taking: Reported on 01/01/2019 07/27/15   Carmin Muskrat, MD  metoprolol succinate (TOPROL-XL) 100 MG 24 hr tablet Take 1 tablet (100 mg total) by mouth daily. Take with or immediately following a meal. Patient not taking: Reported on 01/01/2019 04/19/17   Dessa Phi, DO    Family History Family History  Problem Relation Age of Onset  . Diabetes Mother   . Heart disease Mother   . Heart disease Father     Social History Social History   Tobacco Use  . Smoking status: Current Every Day Smoker    Packs/day: 1.00    Years: 73.00    Pack years: 73.00    Types: Cigarettes    Start date: 07/30/1945  . Smokeless tobacco: Never Used  Substance Use Topics  . Alcohol use: No    Alcohol/week: 0.0 standard drinks  . Drug use: No     Allergies   Cefuroxime   Review of Systems Review of Systems  Unable to perform ROS: Dementia  Respiratory: Negative for shortness of breath.   Cardiovascular: Negative for chest pain.  Gastrointestinal: Negative for abdominal pain.     Physical Exam Updated Vital Signs BP 135/69 (BP Location: Right Arm)   Pulse 84   Temp 98.3 F (36.8 C) (Axillary)   Resp 18    SpO2 99%   Physical Exam Constitutional:      General: She is not in acute distress.    Appearance: Normal appearance.  HENT:     Head: Normocephalic and atraumatic.     Right Ear: Tympanic membrane normal.     Left Ear: Tympanic membrane normal.     Nose: Nose normal.     Mouth/Throat:     Mouth: Mucous membranes are moist.     Pharynx: Oropharynx is clear.  Eyes:     Conjunctiva/sclera: Conjunctivae normal.     Pupils: Pupils are equal, round, and reactive to light.  Neck:     Musculoskeletal: Normal range of motion and neck supple.  Cardiovascular:     Rate and Rhythm: Normal rate and regular rhythm.     Pulses: Normal pulses.  Heart sounds: Normal heart sounds.  Pulmonary:     Effort: Pulmonary effort is normal.     Breath sounds: Normal breath sounds.  Abdominal:     General: Abdomen is flat. Bowel sounds are normal.     Tenderness: There is no abdominal tenderness. There is no guarding.  Musculoskeletal: Normal range of motion.        General: No tenderness, deformity or signs of injury.     Right lower leg: No edema.     Left lower leg: No edema.  Skin:    General: Skin is warm and dry.     Capillary Refill: Capillary refill takes less than 2 seconds.  Neurological:     Mental Status: She is alert.     Deep Tendon Reflexes: Reflexes normal.      ED Treatments / Results  Labs (all labs ordered are listed, but only abnormal results are displayed) Labs Reviewed - No data to display  EKG None  Radiology Ct Head Wo Contrast  Result Date: 01/01/2019 CLINICAL DATA:  Fall EXAM: CT HEAD WITHOUT CONTRAST CT CERVICAL SPINE WITHOUT CONTRAST TECHNIQUE: Multidetector CT imaging of the head and cervical spine was performed following the standard protocol without intravenous contrast. Multiplanar CT image reconstructions of the cervical spine were also generated. COMPARISON:  04/15/2017 FINDINGS: CT HEAD FINDINGS Brain: No evidence of acute infarction, hemorrhage,  hydrocephalus, extra-axial collection or mass lesion/mass effect. History of dementia but mild for age cerebral volume loss and chronic small vessel ischemic change. Vascular: No hyperdense vessel or unexpected calcification. Skull: Burr holes on the right.  No acute or aggressive finding Sinuses/Orbits: No evidence of injury. Cataract resection and partial left sphenoid sinus opacification. CT CERVICAL SPINE FINDINGS Alignment: No traumatic malalignment. Skull base and vertebrae: Remote appearing T1 superior endplate fracture. Negative for acute fracture Soft tissues and spinal canal: No prevertebral fluid or swelling. No visible canal hematoma. Nodular goiter that is stable from 2006 chest CT. Atherosclerosis. Disc levels: C2-3 and C3-4 arthrodesis. Subjacent degenerative facet spurring and disc narrowing. Upper chest: Pleural based scarring at the apices. IMPRESSION: No evidence of acute intracranial or cervical spine injury. Electronically Signed   By: Monte Fantasia M.D.   On: 01/01/2019 04:16   Ct Cervical Spine Wo Contrast  Result Date: 01/01/2019 CLINICAL DATA:  Fall EXAM: CT HEAD WITHOUT CONTRAST CT CERVICAL SPINE WITHOUT CONTRAST TECHNIQUE: Multidetector CT imaging of the head and cervical spine was performed following the standard protocol without intravenous contrast. Multiplanar CT image reconstructions of the cervical spine were also generated. COMPARISON:  04/15/2017 FINDINGS: CT HEAD FINDINGS Brain: No evidence of acute infarction, hemorrhage, hydrocephalus, extra-axial collection or mass lesion/mass effect. History of dementia but mild for age cerebral volume loss and chronic small vessel ischemic change. Vascular: No hyperdense vessel or unexpected calcification. Skull: Burr holes on the right.  No acute or aggressive finding Sinuses/Orbits: No evidence of injury. Cataract resection and partial left sphenoid sinus opacification. CT CERVICAL SPINE FINDINGS Alignment: No traumatic malalignment.  Skull base and vertebrae: Remote appearing T1 superior endplate fracture. Negative for acute fracture Soft tissues and spinal canal: No prevertebral fluid or swelling. No visible canal hematoma. Nodular goiter that is stable from 2006 chest CT. Atherosclerosis. Disc levels: C2-3 and C3-4 arthrodesis. Subjacent degenerative facet spurring and disc narrowing. Upper chest: Pleural based scarring at the apices. IMPRESSION: No evidence of acute intracranial or cervical spine injury. Electronically Signed   By: Monte Fantasia M.D.   On: 01/01/2019 04:16  Dg Hips Bilat W Or Wo Pelvis 5 Views  Result Date: 01/01/2019 CLINICAL DATA:  Fall with altered mental status EXAM: DG HIP (WITH OR WITHOUT PELVIS) 5+V BILAT COMPARISON:  None. FINDINGS: No evidence of acute fracture or dislocation. Prominent osteopenia. Degenerative disc narrowing and endplate ridging. IMPRESSION: No acute finding. Electronically Signed   By: Monte Fantasia M.D.   On: 01/01/2019 04:22    Procedures Procedures (including critical care time)  Stable for discharge  Final Clinical Impressions(s) / ED Diagnoses   Return for intractable cough, coughing up blood,fevers >100.4 unrelieved by medication, shortness of breath, intractable vomiting, chest pain, shortness of breath, weakness,numbness, changes in speech, facial asymmetry,abdominal pain, passing out,Inability to tolerate liquids or food, cough, altered mental status or any concerns. No signs of systemic illness or infection. The patient is nontoxic-appearing on exam and vital signs are within normal limits.   I have reviewed the triage vital signs and the nursing notes. Pertinent labs &imaging results that were available during my care of the patient were reviewed by me and considered in my medical decision making (see chart for details).  After history, exam, and medical workup I feel the patient has been appropriately medically screened and is safe for discharge home.  Pertinent diagnoses were discussed with the patient. Patient was given return precautions.   Kendrick Haapala, MD 01/01/19 0530

## 2019-01-01 NOTE — ED Triage Notes (Signed)
Patient is from [facility], she was found with upper body off bed and head possibly hit wheels of the serving tray that was next to her bed.  Patient is not on blood thinners.  No LOC.  Hx of Dementia and Alzheimer's.

## 2019-01-01 NOTE — ED Notes (Signed)
[facility] called  

## 2019-01-01 NOTE — ED Notes (Signed)
Attempted to call report to [facility]

## 2019-01-20 DEATH — deceased
